# Patient Record
Sex: Male | Born: 1950
Health system: Southern US, Community
[De-identification: ages and names within clinical notes are randomized; demographics above are authoritative.]

## PROBLEM LIST (undated history)

## (undated) DIAGNOSIS — I10 Essential (primary) hypertension: Secondary | ICD-10-CM

## (undated) DIAGNOSIS — E785 Hyperlipidemia, unspecified: Secondary | ICD-10-CM

## (undated) DIAGNOSIS — E119 Type 2 diabetes mellitus without complications: Secondary | ICD-10-CM

## (undated) DIAGNOSIS — T7840XA Allergy, unspecified, initial encounter: Secondary | ICD-10-CM

## (undated) HISTORY — PX: TONSILLECTOMY: SUR1361

## (undated) HISTORY — DX: Hyperlipidemia, unspecified: E78.5

## (undated) HISTORY — DX: Essential (primary) hypertension: I10

## (undated) HISTORY — DX: Allergy, unspecified, initial encounter: T78.40XA

## (undated) HISTORY — DX: Type 2 diabetes mellitus without complications: E11.9

---

## 1999-06-30 ENCOUNTER — Encounter: Admission: RE | Admit: 1999-06-30 | Discharge: 1999-09-28 | Payer: Self-pay | Admitting: Internal Medicine

## 2010-02-11 ENCOUNTER — Emergency Department (HOSPITAL_COMMUNITY): Admission: EM | Admit: 2010-02-11 | Discharge: 2010-02-11 | Payer: Self-pay | Admitting: Emergency Medicine

## 2010-09-29 ENCOUNTER — Emergency Department (HOSPITAL_COMMUNITY)
Admission: EM | Admit: 2010-09-29 | Discharge: 2010-09-29 | Payer: Self-pay | Source: Home / Self Care | Admitting: Emergency Medicine

## 2010-09-30 LAB — CBC
HCT: 36.9 % — ABNORMAL LOW (ref 39.0–52.0)
Hemoglobin: 13 g/dL (ref 13.0–17.0)
MCH: 30 pg (ref 26.0–34.0)
MCHC: 35.2 g/dL (ref 30.0–36.0)
MCV: 85.2 fL (ref 78.0–100.0)
Platelets: 218 10*3/uL (ref 150–400)
RBC: 4.33 MIL/uL (ref 4.22–5.81)
RDW: 12.2 % (ref 11.5–15.5)
WBC: 8.6 10*3/uL (ref 4.0–10.5)

## 2010-09-30 LAB — BASIC METABOLIC PANEL
BUN: 17 mg/dL (ref 6–23)
CO2: 25 mEq/L (ref 19–32)
Calcium: 9.2 mg/dL (ref 8.4–10.5)
Chloride: 102 mEq/L (ref 96–112)
Creatinine, Ser: 1.32 mg/dL (ref 0.4–1.5)
GFR calc Af Amer: >60 mL/min (ref >60)
GFR calc non Af Amer: 56 mL/min — ABNORMAL LOW (ref >60)
Glucose, Bld: 229 mg/dL — ABNORMAL HIGH (ref 70–99)
Potassium: 4.5 mEq/L (ref 3.5–5.1)
Sodium: 137 mEq/L (ref 135–145)

## 2010-09-30 LAB — GLUCOSE, CAPILLARY: Glucose-Capillary: 237 mg/dL — ABNORMAL HIGH (ref 70–99)

## 2012-09-13 DIAGNOSIS — Z9889 Other specified postprocedural states: Secondary | ICD-10-CM | POA: Insufficient documentation

## 2016-02-13 DIAGNOSIS — H35033 Hypertensive retinopathy, bilateral: Secondary | ICD-10-CM | POA: Insufficient documentation

## 2016-02-13 DIAGNOSIS — H2513 Age-related nuclear cataract, bilateral: Secondary | ICD-10-CM | POA: Insufficient documentation

## 2016-02-13 DIAGNOSIS — H35371 Puckering of macula, right eye: Secondary | ICD-10-CM | POA: Insufficient documentation

## 2017-09-13 HISTORY — PX: CATARACT EXTRACTION: SUR2

## 2018-01-03 DIAGNOSIS — J309 Allergic rhinitis, unspecified: Secondary | ICD-10-CM | POA: Diagnosis not present

## 2018-01-03 DIAGNOSIS — Z0000 Encounter for general adult medical examination without abnormal findings: Secondary | ICD-10-CM | POA: Diagnosis not present

## 2018-01-03 DIAGNOSIS — I10 Essential (primary) hypertension: Secondary | ICD-10-CM | POA: Diagnosis not present

## 2018-01-03 DIAGNOSIS — E559 Vitamin D deficiency, unspecified: Secondary | ICD-10-CM | POA: Diagnosis not present

## 2018-01-03 DIAGNOSIS — R351 Nocturia: Secondary | ICD-10-CM | POA: Diagnosis not present

## 2018-01-03 DIAGNOSIS — E1165 Type 2 diabetes mellitus with hyperglycemia: Secondary | ICD-10-CM | POA: Diagnosis not present

## 2018-01-10 DIAGNOSIS — E1165 Type 2 diabetes mellitus with hyperglycemia: Secondary | ICD-10-CM | POA: Diagnosis not present

## 2018-05-10 DIAGNOSIS — Z6831 Body mass index (BMI) 31.0-31.9, adult: Secondary | ICD-10-CM | POA: Diagnosis not present

## 2018-05-10 DIAGNOSIS — I10 Essential (primary) hypertension: Secondary | ICD-10-CM | POA: Diagnosis not present

## 2018-05-10 DIAGNOSIS — E1165 Type 2 diabetes mellitus with hyperglycemia: Secondary | ICD-10-CM | POA: Diagnosis not present

## 2018-05-10 DIAGNOSIS — E669 Obesity, unspecified: Secondary | ICD-10-CM | POA: Diagnosis not present

## 2018-08-28 ENCOUNTER — Encounter: Payer: Self-pay | Admitting: Internal Medicine

## 2018-08-28 ENCOUNTER — Ambulatory Visit (INDEPENDENT_AMBULATORY_CARE_PROVIDER_SITE_OTHER): Payer: Medicare Other | Admitting: Internal Medicine

## 2018-08-28 VITALS — BP 116/70 | HR 90 | Temp 98.2°F | Ht 68.0 in | Wt 202.8 lb

## 2018-08-28 DIAGNOSIS — E6609 Other obesity due to excess calories: Secondary | ICD-10-CM | POA: Insufficient documentation

## 2018-08-28 DIAGNOSIS — Z6830 Body mass index (BMI) 30.0-30.9, adult: Secondary | ICD-10-CM | POA: Diagnosis not present

## 2018-08-28 DIAGNOSIS — E66811 Obesity, class 1: Secondary | ICD-10-CM | POA: Insufficient documentation

## 2018-08-28 DIAGNOSIS — I10 Essential (primary) hypertension: Secondary | ICD-10-CM | POA: Diagnosis not present

## 2018-08-28 DIAGNOSIS — Z6831 Body mass index (BMI) 31.0-31.9, adult: Secondary | ICD-10-CM | POA: Insufficient documentation

## 2018-08-28 DIAGNOSIS — E1165 Type 2 diabetes mellitus with hyperglycemia: Secondary | ICD-10-CM

## 2018-08-28 DIAGNOSIS — Z712 Person consulting for explanation of examination or test findings: Secondary | ICD-10-CM

## 2018-08-28 DIAGNOSIS — Z7982 Long term (current) use of aspirin: Secondary | ICD-10-CM

## 2018-08-28 NOTE — Patient Instructions (Signed)

## 2018-08-28 NOTE — Progress Notes (Signed)
Subjective:     Patient ID: Marcus Davis , male    DOB: May 04, 1951 , 67 y.o.   MRN: [id number]   Chief Complaint  Patient presents with  . Diabetes  . Hypertension    HPI  Diabetes  He presents for his follow-up diabetic visit. He has type 2 diabetes mellitus. His disease course has been stable. There are no hypoglycemic associated symptoms. Pertinent negatives for hypoglycemia include no headaches. Pertinent negatives for diabetes include no blurred vision and no chest pain. There are no hypoglycemic complications. Risk factors for coronary artery disease include diabetes mellitus, hypertension and male sex. He is following a diabetic diet. He participates in exercise three times a week.  Hypertension  This is a chronic problem. The current episode started more than 1 year ago. The problem has been gradually improving since onset. The problem is controlled. Pertinent negatives include no blurred vision, chest pain, headaches or shortness of breath.   He reports compliance with meds.   Past Medical History:  Diagnosis Date  . DM (diabetes mellitus) ([place])   . HTN (hypertension)      Family History  Problem Relation Age of Onset  . Diabetes Mother   . Hypertension Mother   . Stroke Mother   . Hypertension Father   . Diabetes Father   . Stroke Paternal Grandfather      Current Outpatient Medications:  .  amLODipine (NORVASC) 10 MG tablet, Take by mouth., Disp: , Rfl:  .  aspirin EC 81 MG tablet, Take 81 mg by mouth daily., Disp: , Rfl:  .  hydrochlorothiazide (HYDRODIURIL) 25 MG tablet, Take by mouth., Disp: , Rfl:  .  lisinopril (PRINIVIL,ZESTRIL) 40 MG tablet, Take by mouth., Disp: , Rfl:  .  sitaGLIPtin-metformin (JANUMET) 50-1000 MG tablet, Take by mouth., Disp: , Rfl:  .  Vitamin D, Cholecalciferol, 25 MCG (1000 UT) CAPS, Take by mouth., Disp: , Rfl:    No Known Allergies   Review of Systems  Constitutional: Negative.   Eyes: Negative for blurred vision.   Respiratory: Negative.  Negative for shortness of breath.   Cardiovascular: Negative.  Negative for chest pain.  Gastrointestinal: Negative.   Neurological: Negative.  Negative for headaches.  Psychiatric/Behavioral: Negative.      Today's Vitals   08/28/18 1157  BP: 116/70  Pulse: 90  Temp: 98.2 F (36.8 C)  TempSrc: Oral  Weight: 202 lb 12.8 oz (92 kg)  Height: _0  (1.727 m)   Body mass index is 30.84 kg/m.   Objective:  Physical Exam Vitals signs and nursing note reviewed.  Constitutional:      Appearance: Normal appearance.  HENT:     Head: Normocephalic and atraumatic.  Neck:     Musculoskeletal: Normal range of motion.  Cardiovascular:     Rate and Rhythm: Normal rate and regular rhythm.     Heart sounds: Normal heart sounds.  Pulmonary:     Effort: Pulmonary effort is normal.     Breath sounds: Normal breath sounds.  Skin:    General: Skin is warm and dry.  Neurological:     General: No focal deficit present.     Mental Status: He is alert.  Psychiatric:        Mood and Affect: Mood normal.         Assessment And Plan:     1. Uncontrolled type 2 diabetes mellitus with hyperglycemia (HCC)  Medications reviewed. I see he is not on a statin. I will defer  checking lipid panel until his next visit in 2020. He will have Medicare at that time. He thinks he has been one in the past. Importance of regular exercise was discussed with the patient.  Previous lab results in [place] were reviewed in full detail. He will rto in 3 months for re-evaluation.   - Hemoglobin A1c - BMP8+EGFR  2. Essential hypertension, benign  Well controlled.  He will continue with current meds. He is encouraged to avoid adding salt to his foods.   3. Class 1 obesity due to excess calories with serious comorbidity and body mass index (BMI) of 30.0 to 30.9 in adult  He is encouraged to strive for BMI less than 27 to decrease cardiac risk.   [name], MD

## 2018-08-29 LAB — BMP8+EGFR
BUN/Creatinine Ratio: 9 — ABNORMAL LOW (ref 10–24)
BUN: 10 mg/dL (ref 8–27)
CO2: 22 mmol/L (ref 20–29)
Calcium: 9.4 mg/dL (ref 8.6–10.2)
Chloride: 102 mmol/L (ref 96–106)
Creatinine, Ser: 1.14 mg/dL (ref 0.76–1.27)
GFR calc Af Amer: 78 mL/min/{1.73_m2} (ref >59)
GFR calc non Af Amer: 67 mL/min/{1.73_m2} (ref >59)
Glucose: 81 mg/dL (ref 65–99)
Potassium: 4.4 mmol/L (ref 3.5–5.2)
Sodium: 140 mmol/L (ref 134–144)

## 2018-08-29 LAB — HEMOGLOBIN A1C
Est. average glucose Bld gHb Est-mCnc: 134 mg/dL
Hgb A1c MFr Bld: 6.3 % — ABNORMAL HIGH (ref 4.8–5.6)

## 2018-08-29 NOTE — Progress Notes (Signed)
Your hba1c is 6.3, your kidney fxn is stable. Be sure to stay well hydrated.

## 2018-11-29 ENCOUNTER — Ambulatory Visit: Payer: Medicare Other | Admitting: Internal Medicine

## 2018-11-30 ENCOUNTER — Ambulatory Visit (INDEPENDENT_AMBULATORY_CARE_PROVIDER_SITE_OTHER): Payer: Medicare HMO | Admitting: Internal Medicine

## 2018-11-30 ENCOUNTER — Encounter: Payer: Self-pay | Admitting: Internal Medicine

## 2018-11-30 ENCOUNTER — Other Ambulatory Visit: Payer: Self-pay

## 2018-11-30 VITALS — BP 144/82 | HR 97 | Temp 98.7°F | Ht 68.5 in | Wt 209.2 lb

## 2018-11-30 DIAGNOSIS — I10 Essential (primary) hypertension: Secondary | ICD-10-CM

## 2018-11-30 DIAGNOSIS — E1165 Type 2 diabetes mellitus with hyperglycemia: Secondary | ICD-10-CM | POA: Diagnosis not present

## 2018-11-30 DIAGNOSIS — Z6831 Body mass index (BMI) 31.0-31.9, adult: Secondary | ICD-10-CM | POA: Diagnosis not present

## 2018-11-30 DIAGNOSIS — E7800 Pure hypercholesterolemia, unspecified: Secondary | ICD-10-CM

## 2018-11-30 DIAGNOSIS — E6609 Other obesity due to excess calories: Secondary | ICD-10-CM

## 2018-11-30 DIAGNOSIS — Z1211 Encounter for screening for malignant neoplasm of colon: Secondary | ICD-10-CM | POA: Diagnosis not present

## 2018-11-30 NOTE — Patient Instructions (Signed)
Preventing Unhealthy Weight Gain, Adult  Staying at a healthy weight is important to your overall health. When fat builds up in your body, you may become overweight or obese. Being overweight or obese increases your risk of developing certain health problems, such as heart disease, diabetes, sleeping problems, joint problems, and some types of cancer.  Unhealthy weight gain is often the result of making unhealthy food choices or not getting enough exercise. You can make changes to your lifestyle to prevent obesity and stay as healthy as possible.  What nutrition changes can be made?    Eat only as much as your body needs. To do this:  Pay attention to signs that you are hungry or full. Stop eating as soon as you feel full.  If you feel hungry, try drinking water first before eating. Drink enough water so your urine is clear or pale yellow.  Eat smaller portions. Pay attention to portion sizes when eating out.  Look at serving sizes on food labels. Most foods contain more than one serving per container.  Eat the recommended number of calories for your gender and activity level. For most active people, a daily total of 2,000 calories is appropriate. If you are trying to lose weight or are not very active, you may need to eat fewer calories. Talk with your health care provider or a diet and nutrition specialist (dietitian) about how many calories you need each day.  Choose healthy foods, such as:  Fruits and vegetables. At each meal, try to fill at least half of your plate with fruits and vegetables.  Whole grains, such as whole-wheat bread, brown rice, and quinoa.  Lean meats, such as chicken or fish.  Other healthy proteins, such as beans, eggs, or tofu.  Healthy fats, such as nuts, seeds, fatty fish, and olive oil.  Low-fat or fat-free dairy products.  Check food labels, and avoid food and drinks that:  Are high in calories.  Have added sugar.  Are high in sodium.  Have saturated fats or trans fats.  Cook foods  in healthier ways, such as by baking, broiling, or grilling.  Make a meal plan for the week, and shop with a grocery list to help you stay on track with your purchases. Try to avoid going to the grocery store when you are hungry.  When grocery shopping, try to shop around the outside of the store first, where the fresh foods are. Doing this helps you to avoid prepackaged foods, which can be high in sugar, salt (sodium), and fat.  What lifestyle changes can be made?    Exercise for 30 or more minutes on 5 or more days each week. Exercising may include brisk walking, yard work, biking, running, swimming, and team sports like basketball and soccer. Ask your health care provider which exercises are safe for you.  Do muscle-strengthening activities, such as lifting weights or using resistance bands, on 2 or more days a week.  Do not use any products that contain nicotine or tobacco, such as cigarettes and e-cigarettes. If you need help quitting, ask your health care provider.  Limit alcohol intake to no more than 1 drink a day for nonpregnant women and 2 drinks a day for men. One drink equals 12 oz of beer, 5 oz of wine, or 1 oz of hard liquor.  Try to get 7-9 hours of sleep each night.  What other changes can be made?  Keep a food and activity journal to keep track of:    What you ate and how many calories you had. Remember to count the calories in sauces, dressings, and side dishes.  Whether you were active, and what exercises you did.  Your calorie, weight, and activity goals.  Check your weight regularly. Track any changes. If you notice you have gained weight, make changes to your diet or activity routine.  Avoid taking weight-loss medicines or supplements. Talk to your health care provider before starting any new medicine or supplement.  Talk to your health care provider before trying any new diet or exercise plan.  Why are these changes important?  Eating healthy, staying active, and having healthy habits can help  you to prevent obesity. Those changes also:  Help you manage stress and emotions.  Help you connect with friends and family.  Improve your self-esteem.  Improve your sleep.  Prevent long-term health problems.  What can happen if changes are not made?  Being obese or overweight can cause you to develop joint or bone problems, which can make it hard for you to stay active or do activities you enjoy. Being obese or overweight also puts stress on your heart and lungs and can lead to health problems like diabetes, heart disease, and some cancers.  Where to find more information  Talk with your health care provider or a dietitian about healthy eating and healthy lifestyle choices. You may also find information from:  U.S. Department of Agriculture, MyPlate: www.choosemyplate.gov  American Heart Association: www.heart.org  Centers for Disease Control and Prevention: www.cdc.gov  Summary  Staying at a healthy weight is important to your overall health. It helps you to prevent certain diseases and health problems, such as heart disease, diabetes, joint problems, sleep disorders, and some types of cancer.  Being obese or overweight can cause you to develop joint or bone problems, which can make it hard for you to stay active or do activities you enjoy.  You can prevent unhealthy weight gain by eating a healthy diet, exercising regularly, not smoking, limiting alcohol, and getting enough sleep.  Talk with your health care provider or a dietitian for guidance about healthy eating and healthy lifestyle choices.  This information is not intended to replace advice given to you by your health care provider. Make sure you discuss any questions you have with your health care provider.  Document Released: 08/31/2016 Document Revised: 06/10/2017 Document Reviewed: 10/06/2016  Elsevier Interactive Patient Education  2019 Elsevier Inc.

## 2018-12-01 LAB — CMP14+EGFR
ALT: 25 IU/L (ref 0–44)
AST: 25 IU/L (ref 0–40)
Albumin/Globulin Ratio: 1.9 (ref 1.2–2.2)
Albumin: 4.1 g/dL (ref 3.8–4.8)
Alkaline Phosphatase: 55 IU/L (ref 39–117)
BUN/Creatinine Ratio: 7 — ABNORMAL LOW (ref 10–24)
BUN: 7 mg/dL — ABNORMAL LOW (ref 8–27)
Bilirubin Total: 0.4 mg/dL (ref 0.0–1.2)
CO2: 21 mmol/L (ref 20–29)
Calcium: 9.2 mg/dL (ref 8.6–10.2)
Chloride: 101 mmol/L (ref 96–106)
Creatinine, Ser: 1.03 mg/dL (ref 0.76–1.27)
GFR calc Af Amer: 88 mL/min/{1.73_m2} (ref >59)
GFR calc non Af Amer: 76 mL/min/{1.73_m2} (ref >59)
Globulin, Total: 2.2 g/dL (ref 1.5–4.5)
Glucose: 184 mg/dL — ABNORMAL HIGH (ref 65–99)
Potassium: 4.1 mmol/L (ref 3.5–5.2)
Sodium: 138 mmol/L (ref 134–144)
Total Protein: 6.3 g/dL (ref 6.0–8.5)

## 2018-12-01 LAB — HEMOGLOBIN A1C
Est. average glucose Bld gHb Est-mCnc: 148 mg/dL
Hgb A1c MFr Bld: 6.8 % — ABNORMAL HIGH (ref 4.8–5.6)

## 2018-12-01 LAB — LIPID PANEL
Chol/HDL Ratio: 2.9 ratio (ref 0.0–5.0)
Cholesterol, Total: 102 mg/dL (ref 100–199)
HDL: 35 mg/dL — ABNORMAL LOW (ref >39)
LDL Calculated: 23 mg/dL (ref 0–99)
Triglycerides: 221 mg/dL — ABNORMAL HIGH (ref 0–149)
VLDL Cholesterol Cal: 44 mg/dL — ABNORMAL HIGH (ref 5–40)

## 2018-12-07 ENCOUNTER — Other Ambulatory Visit: Payer: Self-pay

## 2018-12-08 NOTE — Progress Notes (Signed)
Subjective:     Patient ID: Marcus Davis , male    DOB: 09-23-1950 , 68 y.o.   MRN: [id number]   Chief Complaint  Patient presents with  . Hyperlipidemia  . Diabetes  . Hypertension    HPI  He is currently taking atorvastatin without any issues. He reports daily use. He has not experienced any side effects.   Diabetes  He presents for his follow-up diabetic visit. He has type 2 diabetes mellitus. His disease course has been stable. There are no hypoglycemic associated symptoms. Pertinent negatives for diabetes include no blurred vision and no chest pain. There are no hypoglycemic complications.  Hypertension  This is a chronic problem. The current episode started more than 1 year ago. The problem has been gradually improving since onset. The problem is controlled. Pertinent negatives include no blurred vision, chest pain, palpitations or shortness of breath. Risk factors for coronary artery disease include diabetes mellitus, dyslipidemia, male gender and stress.   He reports compliance with meds.   Past Medical History:  Diagnosis Date  . DM (diabetes mellitus) ([place])   . HTN (hypertension)      Family History  Problem Relation Age of Onset  . Diabetes Mother   . Hypertension Mother   . Stroke Mother   . Hypertension Father   . Diabetes Father   . Stroke Paternal Grandfather      Current Outpatient Medications:  .  amLODipine (NORVASC) 10 MG tablet, Take by mouth., Disp: , Rfl:  .  aspirin EC 81 MG tablet, Take 81 mg by mouth daily., Disp: , Rfl:  .  hydrochlorothiazide (HYDRODIURIL) 25 MG tablet, Take by mouth., Disp: , Rfl:  .  lisinopril (PRINIVIL,ZESTRIL) 40 MG tablet, Take by mouth., Disp: , Rfl:  .  sitaGLIPtin-metformin (JANUMET) 50-1000 MG tablet, Take by mouth., Disp: , Rfl:  .  Vitamin D, Cholecalciferol, 25 MCG (1000 UT) CAPS, Take by mouth., Disp: , Rfl:  .  atorvastatin (LIPITOR) 40 MG tablet, Take 40 mg by mouth daily. 1/2 tablet daily, Disp: , Rfl:    No  Known Allergies   Review of Systems  Constitutional: Negative.   Eyes: Negative for blurred vision.  Respiratory: Negative.  Negative for shortness of breath.   Cardiovascular: Negative.  Negative for chest pain and palpitations.  Gastrointestinal: Negative.   Neurological: Negative.   Psychiatric/Behavioral: Negative.      Today's Vitals   11/30/18 1426  BP: (!) 144/82  Pulse: 97  Temp: 98.7 F (37.1 C)  TempSrc: Oral  Weight: 209 lb 3.2 oz (94.9 kg)  Height: 5' 8.5" (1.74 m)  PainSc: 0-No pain   Body mass index is 31.35 kg/m.   Objective:  Physical Exam Vitals signs and nursing note reviewed.  Constitutional:      Appearance: Normal appearance.  Cardiovascular:     Rate and Rhythm: Normal rate and regular rhythm.     Heart sounds: Normal heart sounds.  Pulmonary:     Effort: Pulmonary effort is normal.     Breath sounds: Normal breath sounds.  Skin:    General: Skin is warm.  Neurological:     General: No focal deficit present.     Mental Status: He is alert.  Psychiatric:        Mood and Affect: Mood normal.         Assessment And Plan:     1. Pure hypercholesterolemia  I will check non-fasting lipid panel. He is encouraged to avoid fried foods and  to exercise no less than five days weekly.   - Lipid panel  2. Uncontrolled type 2 diabetes mellitus with hyperglycemia ([place])  I will check labs as listed below. Importance of dietary, medication and exercise compliance was discussed with the patient.   - CMP14+EGFR - Hemoglobin A1c  3. Essential hypertension, benign  Fair control. He will continue with current meds for now. He is encouraged to avoid adding salt to his foods.   4. Class 1 obesity due to excess calories with serious comorbidity and body mass index (BMI) of 31.0 to 31.9 in adult  He is encouraged to initially strive for BMI less than 30 to decrease cardiac risk. He is advised to exercise no less than 150 minutes per week.    5.  Special screening for malignant neoplasms, colon  He agrees to proceed with Cologuard testing. He is aware that he will need to proceed with colonoscopy if his test is abnormal. He verbalizes understanding of this.    [name], MD

## 2019-01-25 ENCOUNTER — Encounter: Payer: Medicare HMO | Admitting: Internal Medicine

## 2019-01-25 ENCOUNTER — Ambulatory Visit: Payer: Medicare HMO | Admitting: Internal Medicine

## 2019-01-25 ENCOUNTER — Ambulatory Visit: Payer: Medicare HMO

## 2019-01-30 ENCOUNTER — Other Ambulatory Visit: Payer: Self-pay

## 2019-01-30 ENCOUNTER — Ambulatory Visit (INDEPENDENT_AMBULATORY_CARE_PROVIDER_SITE_OTHER): Payer: Medicare HMO | Admitting: Nurse Practitioner

## 2019-01-30 ENCOUNTER — Ambulatory Visit: Payer: Medicare HMO

## 2019-01-30 ENCOUNTER — Encounter: Payer: Medicare HMO | Admitting: Internal Medicine

## 2019-01-30 ENCOUNTER — Encounter: Payer: Self-pay | Admitting: Nurse Practitioner

## 2019-01-30 ENCOUNTER — Ambulatory Visit: Payer: Medicare HMO | Admitting: Internal Medicine

## 2019-01-30 ENCOUNTER — Ambulatory Visit (INDEPENDENT_AMBULATORY_CARE_PROVIDER_SITE_OTHER): Payer: Medicare HMO

## 2019-01-30 VITALS — BP 128/82 | HR 88 | Temp 98.5°F | Ht 69.0 in | Wt 203.0 lb

## 2019-01-30 VITALS — BP 128/82 | HR 88 | Temp 98.5°F | Ht 69.0 in | Wt 203.2 lb

## 2019-01-30 DIAGNOSIS — Z6831 Body mass index (BMI) 31.0-31.9, adult: Secondary | ICD-10-CM | POA: Diagnosis not present

## 2019-01-30 DIAGNOSIS — E119 Type 2 diabetes mellitus without complications: Secondary | ICD-10-CM

## 2019-01-30 DIAGNOSIS — E6609 Other obesity due to excess calories: Secondary | ICD-10-CM | POA: Diagnosis not present

## 2019-01-30 DIAGNOSIS — Z0000 Encounter for general adult medical examination without abnormal findings: Secondary | ICD-10-CM

## 2019-01-30 DIAGNOSIS — I10 Essential (primary) hypertension: Secondary | ICD-10-CM | POA: Diagnosis not present

## 2019-01-30 DIAGNOSIS — E1165 Type 2 diabetes mellitus with hyperglycemia: Secondary | ICD-10-CM | POA: Diagnosis not present

## 2019-01-30 DIAGNOSIS — R351 Nocturia: Secondary | ICD-10-CM

## 2019-01-30 LAB — POCT URINALYSIS DIPSTICK
Bilirubin, UA: NEGATIVE
Blood, UA: NEGATIVE
Glucose, UA: NEGATIVE
Ketones, UA: NEGATIVE
Leukocytes, UA: NEGATIVE
Nitrite, UA: NEGATIVE
Protein, UA: POSITIVE — AB
Spec Grav, UA: 1.02 (ref 1.010–1.025)
Urobilinogen, UA: 0.2 E.U./dL
pH, UA: 6 (ref 5.0–8.0)

## 2019-01-30 LAB — POCT UA - MICROALBUMIN
Albumin/Creatinine Ratio, Urine, POC: 300
Creatinine, POC: 300 mg/dL
Microalbumin Ur, POC: 80 mg/L

## 2019-01-30 NOTE — Progress Notes (Signed)
Subjective:   Marcus Davis is a 68 y.o. male who presents for Medicare Annual/Subsequent preventive examination.  Review of Systems:  n/a Cardiac Risk Factors include: advanced age (>4655men, 101>65 women);male gender;hypertension;diabetes mellitus     Objective:    Vitals: BP 128/82 (BP Location: Left Arm, Patient Position: Sitting, Cuff Size: Large)   Pulse 88   Temp 98.5 F (36.9 C) (Oral)   Ht 5\' 9"  (1.753 m)   Wt 203 lb (92.1 kg)   BMI 29.98 kg/m   Body mass index is 29.98 kg/m.  Advanced Directives 01/30/2019  Does Patient Have a Medical Advance Directive? No  Would patient like information on creating a medical advance directive? No - Patient declined    Tobacco Social History   Tobacco Use  Smoking Status Never Smoker  Smokeless Tobacco Never Used     Counseling given: Not Answered   Clinical Intake:  Pre-visit preparation completed: Yes  Pain : No/denies pain Pain Score: 0-No pain     Nutritional Status: BMI 25 -29 Overweight Nutritional Risks: None Diabetes: Yes CBG done?: No Did pt. bring in CBG monitor from home?: No  How often do you need to have someone help you when you read instructions, pamphlets, or other written materials from your doctor or pharmacy?: 1 - Never What is the last grade level you completed in school?: some college  Interpreter Needed?: No  Information entered by :: NAllen LPN  Past Medical History:  Diagnosis Date  . DM (diabetes mellitus) (HCC)   . HTN (hypertension)    Past Surgical History:  Procedure Laterality Date  . CATARACT EXTRACTION Right 2019  . TONSILLECTOMY     age 89   Family History  Problem Relation Age of Onset  . Diabetes Mother   . Hypertension Mother   . Stroke Mother   . Hypertension Father   . Diabetes Father   . Stroke Paternal Grandfather    Social History   Socioeconomic History  . Marital status: Married    Spouse name: Not on file  . Number of children: Not on file  . Years of  education: Not on file  . Highest education level: Not on file  Occupational History  . Occupation: retired  Engineer, productionocial Needs  . Financial resource strain: Not hard at all  . Food insecurity:    Worry: Never true    Inability: Never true  . Transportation needs:    Medical: No    Non-medical: No  Tobacco Use  . Smoking status: Never Smoker  . Smokeless tobacco: Never Used  Substance and Sexual Activity  . Alcohol use: Not Currently    Comment: Holidays  . Drug use: Never  . Sexual activity: Yes  Lifestyle  . Physical activity:    Days per week: 3 days    Minutes per session: 150+ min  . Stress: Not at all  Relationships  . Social connections:    Talks on phone: Not on file    Gets together: Not on file    Attends religious service: Not on file    Active member of club or organization: Not on file    Attends meetings of clubs or organizations: Not on file    Relationship status: Not on file  Other Topics Concern  . Not on file  Social History Narrative  . Not on file    Outpatient Encounter Medications as of 01/30/2019  Medication Sig  . amLODipine (NORVASC) 10 MG tablet Take by mouth.  .Marland Kitchen  aspirin EC 81 MG tablet Take 81 mg by mouth daily.  Marland Kitchen atorvastatin (LIPITOR) 40 MG tablet Take 40 mg by mouth daily. 1/2 tablet daily  . hydrochlorothiazide (HYDRODIURIL) 25 MG tablet Take by mouth.  Marland Kitchen lisinopril (PRINIVIL,ZESTRIL) 40 MG tablet Take by mouth.  . sitaGLIPtin-metformin (JANUMET) 50-1000 MG tablet Take by mouth.  . Vitamin D, Cholecalciferol, 25 MCG (1000 UT) CAPS Take by mouth.   No facility-administered encounter medications on file as of 01/30/2019.     Activities of Daily Living In your present state of health, do you have any difficulty performing the following activities: 01/30/2019  Hearing? N  Vision? N  Difficulty concentrating or making decisions? N  Walking or climbing stairs? N  Dressing or bathing? N  Doing errands, shopping? N  Preparing Food and eating  ? N  Using the Toilet? N  In the past six months, have you accidently leaked urine? N  Do you have problems with loss of bowel control? N  Managing your Medications? N  Managing your Finances? N  Housekeeping or managing your Housekeeping? N  Some recent data might be hidden    Patient Care Team: [name], MD as PCP - General (Internal Medicine)   Assessment:   This is a routine wellness examination for Marcus Davis.  Exercise Activities and Dietary recommendations Current Exercise Habits: Home exercise routine, Type of exercise: walking, Time (Minutes): > 60, Frequency (Times/Week): 3, Weekly Exercise (Minutes/Week): 0, Intensity: Moderate  Goals    . Patient Stated     To stay healthy        Fall Risk Fall Risk  01/30/2019 01/30/2019 11/30/2018  Falls in the past year? 0 0 0  Risk for fall due to : Medication side effect - -  Follow up Education provided;Falls prevention discussed - -   Is the patient's home free of loose throw rugs in walkways, pet beds, electrical cords, etc?   yes      Grab bars in the bathroom? yes      Handrails on the stairs?  yes      Adequate lighting?   yes  Timed Get Up and Go Performed: n/a  Depression Screen PHQ 2/9 Scores 01/30/2019 01/30/2019 11/30/2018  PHQ - 2 Score 0 0 0  PHQ- 9 Score 0 - -    Cognitive Function     6CIT Screen 01/30/2019  What Year? 0 points  What month? 0 points  What time? 0 points  Count back from 20 0 points  Months in reverse 0 points  Repeat phrase 0 points  Total Score 0     There is no immunization history on file for this patient.  Qualifies for Shingles Vaccine? yes  Screening Tests Health Maintenance  Topic Date Due  . OPHTHALMOLOGY EXAM  12/13/1962  . COLONOSCOPY  12/13/2002  . PNA vac Low Risk Adult (1 of 2 - PCV13) 12/12/2017  . FOOT EXAM  01/04/2019  . INFLUENZA VACCINE  04/14/2019  . HEMOGLOBIN A1C  06/02/2019  . TETANUS/TDAP  02/18/2026  . Hepatitis C Screening  Completed    Cancer Screenings: Lung: Low Dose CT Chest recommended if Age 21-80 years, 30 pack-year currently smoking OR have quit w/in 15years. Patient does not qualify. Colorectal: up to date per patient   Additional Screenings:  Hepatitis C Screening:11/17/2015      Plan:   Wants to stay healthy  I have personally reviewed and noted the following in the patient's chart:   . Medical and  social history . Use of alcohol, tobacco or illicit drugs  . Current medications and supplements . Functional ability and status . Nutritional status . Physical activity . Advanced directives . List of other physicians . Hospitalizations, surgeries, and ER visits in previous 12 months . Vitals . Screenings to include cognitive, depression, and falls . Referrals and appointments  In addition, I have reviewed and discussed with patient certain preventive protocols, quality metrics, and best practice recommendations. A written personalized care plan for preventive services as well as general preventive health recommendations were provided to patient.     Barb Merino, LPN  [id number]

## 2019-01-30 NOTE — Progress Notes (Signed)
Subjective:     Patient ID: Marcus Davis , male    DOB: [birth year] , 68 y.o.   MRN: [id number]   Chief Complaint  Patient presents with  . Annual Exam   Men's preventive visit. Patient Health Questionnaire (PHQ-2) is    Office Visit from 01/30/2019 in [facility]  PHQ-2 Total Score  0     Patient is on a regular diet, limited bread intake. Marital status: Married. Relevant history for alcohol use is:  Social History   Substance and Sexual Activity  Alcohol Use Not Currently   Comment: Holidays   Relevant history for tobacco use is:  Social History   Tobacco Use  Smoking Status Never Smoker  Smokeless Tobacco Never Used    Exercise walking 3 days per week about 2 hours   HPI  Here for HM  Hypertension  This is a chronic problem. The current episode started more than 1 year ago. The problem is controlled. Pertinent negatives include no anxiety, chest pain or palpitations. There are no associated agents to hypertension. There are no known risk factors for coronary artery disease. Past treatments include angiotensin blockers and diuretics. There are no compliance problems.  There is no history of angina or kidney disease. There is no history of chronic renal disease.  Diabetes  He presents for his follow-up diabetic visit. He has type 2 diabetes mellitus. There are no hypoglycemic associated symptoms. There are no diabetic associated symptoms. Pertinent negatives for diabetes include no chest pain. There are no hypoglycemic complications. There are no known risk factors for coronary artery disease. Current diabetic treatment includes oral agent (monotherapy). He is compliant with treatment all of the time. When asked about meal planning, he reported none. He has not had a previous visit with a dietitian. He participates in exercise three times a week. There is no change in his home blood glucose trend. An ACE inhibitor/angiotensin II receptor blocker is being  taken. He does not see a podiatrist.Eye exam is current (went last May).     Past Medical History:  Diagnosis Date  . DM (diabetes mellitus) (HCC)   . HTN (hypertension)      Family History  Problem Relation Age of Onset  . Diabetes Mother   . Hypertension Mother   . Stroke Mother   . Hypertension Father   . Diabetes Father   . Stroke Paternal Grandfather      Current Outpatient Medications:  .  amLODipine (NORVASC) 10 MG tablet, Take by mouth., Disp: , Rfl:  .  aspirin EC 81 MG tablet, Take 81 mg by mouth daily., Disp: , Rfl:  .  atorvastatin (LIPITOR) 40 MG tablet, Take 40 mg by mouth daily. 1/2 tablet daily, Disp: , Rfl:  .  hydrochlorothiazide (HYDRODIURIL) 25 MG tablet, Take by mouth., Disp: , Rfl:  .  lisinopril (PRINIVIL,ZESTRIL) 40 MG tablet, Take by mouth., Disp: , Rfl:  .  sitaGLIPtin-metformin (JANUMET) 50-1000 MG tablet, Take by mouth., Disp: , Rfl:  .  Vitamin D, Cholecalciferol, 25 MCG (1000 UT) CAPS, Take by mouth., Disp: , Rfl:    Allergies  Allergen Reactions  . Pollen Extract Other (See Comments)    Sneezing, runny nose, congestion,      Review of Systems  Constitutional: Negative.   HENT: Negative.   Eyes: Negative.   Respiratory: Negative.   Cardiovascular: Negative.  Negative for chest pain, palpitations and leg swelling.  Gastrointestinal: Negative.   Endocrine: Negative.   Genitourinary: Negative.  Musculoskeletal: Negative.   Skin: Negative.   Neurological: Negative.   Hematological: Negative.   Psychiatric/Behavioral: Negative.      Today's Vitals   01/30/19 1103  BP: 128/82  Pulse: 88  Temp: 98.5 F (36.9 C)  TempSrc: Oral  Weight: 203 lb 3.2 oz (92.2 kg)  Height: 5\' 9"  (1.753 m)  PainSc: 0-No pain   Body mass index is 30.01 kg/m.   Objective:  Physical Exam Vitals signs reviewed.  Constitutional:      Appearance: Normal appearance.  HENT:     Head: Normocephalic and atraumatic.     Right Ear: Tympanic membrane and  external ear normal. There is no impacted cerumen.     Left Ear: Tympanic membrane, ear canal and external ear normal. There is no impacted cerumen.  Eyes:     Extraocular Movements: Extraocular movements intact.     Conjunctiva/sclera: Conjunctivae normal.     Pupils: Pupils are equal, round, and reactive to light.  Neck:     Musculoskeletal: Normal range of motion and neck supple.  Cardiovascular:     Rate and Rhythm: Normal rate and regular rhythm.     Pulses: Normal pulses.     Heart sounds: Normal heart sounds. No murmur.  Chest:     Chest wall: No tenderness.  Abdominal:     General: Abdomen is flat. Bowel sounds are normal. There is no distension.     Comments: rounded  Musculoskeletal: Normal range of motion.  Skin:    General: Skin is warm and dry.     Capillary Refill: Capillary refill takes less than 2 seconds.  Neurological:     General: No focal deficit present.     Mental Status: He is alert and oriented to person, place, and time.  Psychiatric:        Mood and Affect: Mood normal.        Behavior: Behavior normal.        Thought Content: Thought content normal.        Judgment: Judgment normal.         Assessment And Plan:     1. Health maintenance examination . Behavior modifications discussed and diet history reviewed.   . Pt will continue to exercise regularly and modify diet with low GI, plant based foods and decrease intake of processed foods.  . Recommend intake of daily multivitamin, Vitamin D, and calcium.   2. Essential hypertension . B/P is controlled.  . CMP ordered to check renal function.  . The importance of regular exercise and dietary modification was stressed to the patient.  . Stressed importance of losing ten percent of her body weight to help with B/P control.  . The weight loss would help with decreasing cardiac and cancer risk as well.  - EKG 12-Lead - POCT Urinalysis Dipstick (81002) - POCT UA - Microalbumin  3. Nocturia  Will  check PSA - PSA  4. Class 1 obesity due to excess calories with serious comorbidity and body mass index (BMI) of 31.0 to 31.9 in adult Chronic Discussed healthy diet and regular exercise options  Encouraged to exercise at least 150 minutes per week with 2 days of strength training  5. Type 2 diabetes mellitus without complication, without long-term current use of insulin (HCC) Chronic, stable Diabetic foot exam done Continue with current medications Encouraged to limit intake of sugary foods and drinks Encouraged to increase physical activity to 150 minutes per week - Hemoglobin A1c   Arnette FeltsJanece Jiles Goya, FNP  THE PATIENT IS ENCOURAGED TO PRACTICE SOCIAL DISTANCING DUE TO THE COVID-19 PANDEMIC.   

## 2019-01-30 NOTE — Patient Instructions (Addendum)
Marcus Davis , Thank you for taking time to come for your Medicare Wellness Visit. I appreciate your ongoing commitment to your health goals. Please review the following plan we discussed and let me know if I can assist you in the future.   Screening recommendations/referrals: Colonoscopy: 10/2017 per patient Recommended yearly ophthalmology/optometry visit for glaucoma screening and checkup Recommended yearly dental visit for hygiene and checkup  Vaccinations: Influenza vaccine: declined Pneumococcal vaccine: from [place] not sure when Tdap vaccine: 02/2016 Shingles vaccine: discussed    Advanced directives: Advance directive discussed with you today. Even though you declined this today please call our office should you change your mind and we can give you the proper paperwork for you to fill out.   Conditions/risks identified: Overweight  Next appointment: 05/02/2019 at 2:30  Preventive Care 65 Years and Older, Male Preventive care refers to lifestyle choices and visits with your health care provider that can promote health and wellness. What does preventive care include?  A yearly physical exam. This is also called an annual well check.  Dental exams once or twice a year.  Routine eye exams. Ask your health care provider how often you should have your eyes checked.  Personal lifestyle choices, including:  Daily care of your teeth and gums.  Regular physical activity.  Eating a healthy diet.  Avoiding tobacco and drug use.  Limiting alcohol use.  Practicing safe sex.  Taking low doses of aspirin every day.  Taking vitamin and mineral supplements as recommended by your health care provider. What happens during an annual well check? The services and screenings done by your health care provider during your annual well check will depend on your age, overall health, lifestyle risk factors, and family history of disease. Counseling  Your health care provider may ask you questions  about your:  Alcohol use.  Tobacco use.  Drug use.  Emotional well-being.  Home and relationship well-being.  Sexual activity.  Eating habits.  History of falls.  Memory and ability to understand (cognition).  Work and work Astronomer. Screening  You may have the following tests or measurements:  Height, weight, and BMI.  Blood pressure.  Lipid and cholesterol levels. These may be checked every 5 years, or more frequently if you are over 55 years old.  Skin check.  Lung cancer screening. You may have this screening every year starting at age 71 if you have a 30-pack-year history of smoking and currently smoke or have quit within the past 15 years.  Fecal occult blood test (FOBT) of the stool. You may have this test every year starting at age 10.  Flexible sigmoidoscopy or colonoscopy. You may have a sigmoidoscopy every 5 years or a colonoscopy every 10 years starting at age 43.  Prostate cancer screening. Recommendations will vary depending on your family history and other risks.  Hepatitis C blood test.  Hepatitis B blood test.  Sexually transmitted disease (STD) testing.  Diabetes screening. This is done by checking your blood sugar (glucose) after you have not eaten for a while (fasting). You may have this done every 1-3 years.  Abdominal aortic aneurysm (AAA) screening. You may need this if you are a current or former smoker.  Osteoporosis. You may be screened starting at age 6 if you are at high risk. Talk with your health care provider about your test results, treatment options, and if necessary, the need for more tests. Vaccines  Your health care provider may recommend certain vaccines, such as:  Influenza  vaccine. This is recommended every year.  Tetanus, diphtheria, and acellular pertussis (Tdap, Td) vaccine. You may need a Td booster every 10 years.  Zoster vaccine. You may need this after age 94.  Pneumococcal 13-valent conjugate (PCV13)  vaccine. One dose is recommended after age 17.  Pneumococcal polysaccharide (PPSV23) vaccine. One dose is recommended after age 2. Talk to your health care provider about which screenings and vaccines you need and how often you need them. This information is not intended to replace advice given to you by your health care provider. Make sure you discuss any questions you have with your health care provider. Document Released: 09/26/2015 Document Revised: 05/19/2016 Document Reviewed: 07/01/2015 Elsevier Interactive Patient Education  2017 [place] Prevention in the Home Falls can cause injuries. They can happen to people of all ages. There are many things you can do to make your home safe and to help prevent falls. What can I do on the outside of my home?  Regularly fix the edges of walkways and driveways and fix any cracks.  Remove anything that might make you trip as you walk through a door, such as a raised step or threshold.  Trim any bushes or trees on the path to your home.  Use bright outdoor lighting.  Clear any walking paths of anything that might make someone trip, such as rocks or tools.  Regularly check to see if handrails are loose or broken. Make sure that both sides of any steps have handrails.  Any raised decks and porches should have guardrails on the edges.  Have any leaves, snow, or ice cleared regularly.  Use sand or salt on walking paths during winter.  Clean up any spills in your garage right away. This includes oil or grease spills. What can I do in the bathroom?  Use night lights.  Install grab bars by the toilet and in the tub and shower. Do not use towel bars as grab bars.  Use non-skid mats or decals in the tub or shower.  If you need to sit down in the shower, use a plastic, non-slip stool.  Keep the floor dry. Clean up any water that spills on the floor as soon as it happens.  Remove soap buildup in the tub or shower regularly.   Attach bath mats securely with double-sided non-slip rug tape.  Do not have throw rugs and other things on the floor that can make you trip. What can I do in the bedroom?  Use night lights.  Make sure that you have a light by your bed that is easy to reach.  Do not use any sheets or blankets that are too big for your bed. They should not hang down onto the floor.  Have a firm chair that has side arms. You can use this for support while you get dressed.  Do not have throw rugs and other things on the floor that can make you trip. What can I do in the kitchen?  Clean up any spills right away.  Avoid walking on wet floors.  Keep items that you use a lot in easy-to-reach places.  If you need to reach something above you, use a strong step stool that has a grab bar.  Keep electrical cords out of the way.  Do not use floor polish or wax that makes floors slippery. If you must use wax, use non-skid floor wax.  Do not have throw rugs and other things on the floor that can  make you trip. What can I do with my stairs?  Do not leave any items on the stairs.  Make sure that there are handrails on both sides of the stairs and use them. Fix handrails that are broken or loose. Make sure that handrails are as long as the stairways.  Check any carpeting to make sure that it is firmly attached to the stairs. Fix any carpet that is loose or worn.  Avoid having throw rugs at the top or bottom of the stairs. If you do have throw rugs, attach them to the floor with carpet tape.  Make sure that you have a light switch at the top of the stairs and the bottom of the stairs. If you do not have them, ask someone to add them for you. What else can I do to help prevent falls?  Wear shoes that:  Do not have high heels.  Have rubber bottoms.  Are comfortable and fit you well.  Are closed at the toe. Do not wear sandals.  If you use a stepladder:  Make sure that it is fully opened. Do not climb  a closed stepladder.  Make sure that both sides of the stepladder are locked into place.  Ask someone to hold it for you, if possible.  Clearly mark and make sure that you can see:  Any grab bars or handrails.  First and last steps.  Where the edge of each step is.  Use tools that help you move around (mobility aids) if they are needed. These include:  Canes.  Walkers.  Scooters.  Crutches.  Turn on the lights when you go into a dark area. Replace any light bulbs as soon as they burn out.  Set up your furniture so you have a clear path. Avoid moving your furniture around.  If any of your floors are uneven, fix them.  If there are any pets around you, be aware of where they are.  Review your medicines with your doctor. Some medicines can make you feel dizzy. This can increase your chance of falling. Ask your doctor what other things that you can do to help prevent falls. This information is not intended to replace advice given to you by your health care provider. Make sure you discuss any questions you have with your health care provider. Document Released: 06/26/2009 Document Revised: 02/05/2016 Document Reviewed: 10/04/2014 Elsevier Interactive Patient Education  2017 [employer].

## 2019-01-31 LAB — PSA: Prostate Specific Ag, Serum: 0.2 ng/mL (ref 0.0–4.0)

## 2019-01-31 LAB — HEMOGLOBIN A1C
Est. average glucose Bld gHb Est-mCnc: 137 mg/dL
Hgb A1c MFr Bld: 6.4 % — ABNORMAL HIGH (ref 4.8–5.6)

## 2019-02-01 ENCOUNTER — Ambulatory Visit: Payer: Self-pay | Admitting: Internal Medicine

## 2019-02-01 ENCOUNTER — Ambulatory Visit: Payer: Medicare Other

## 2019-02-01 ENCOUNTER — Ambulatory Visit: Payer: Self-pay

## 2019-02-01 ENCOUNTER — Encounter: Payer: Self-pay | Admitting: Internal Medicine

## 2019-02-08 DIAGNOSIS — E1141 Type 2 diabetes mellitus with diabetic mononeuropathy: Secondary | ICD-10-CM | POA: Insufficient documentation

## 2019-02-08 DIAGNOSIS — E1122 Type 2 diabetes mellitus with diabetic chronic kidney disease: Secondary | ICD-10-CM | POA: Insufficient documentation

## 2019-02-08 DIAGNOSIS — Z794 Long term (current) use of insulin: Secondary | ICD-10-CM | POA: Insufficient documentation

## 2019-02-08 DIAGNOSIS — E119 Type 2 diabetes mellitus without complications: Secondary | ICD-10-CM | POA: Insufficient documentation

## 2019-02-08 NOTE — Patient Instructions (Signed)
Health Maintenance  Topic Date Due  . OPHTHALMOLOGY EXAM  12/13/1962  . COLONOSCOPY  12/13/2002  . PNA vac Low Risk Adult (1 of 2 - PCV13) 12/12/2017  . FOOT EXAM  01/04/2019  . INFLUENZA VACCINE  04/14/2019  . HEMOGLOBIN A1C  08/02/2019  . TETANUS/TDAP  02/18/2026  . Hepatitis C Screening  Completed   Health Maintenance After Age 66 After age 34, you are at a higher risk for certain long-term diseases and infections as well as injuries from falls. Falls are a major cause of broken bones and head injuries in people who are older than age 28. Getting regular preventive care can help to keep you healthy and well. Preventive care includes getting regular testing and making lifestyle changes as recommended by your health care provider. Talk with your health care provider about:  Which screenings and tests you should have. A screening is a test that checks for a disease when you have no symptoms.  A diet and exercise plan that is right for you. What should I know about screenings and tests to prevent falls? Screening and testing are the best ways to find a health problem early. Early diagnosis and treatment give you the best chance of managing medical conditions that are common after age 10. Certain conditions and lifestyle choices may make you more likely to have a fall. Your health care provider may recommend:  Regular vision checks. Poor vision and conditions such as cataracts can make you more likely to have a fall. If you wear glasses, make sure to get your prescription updated if your vision changes.  Medicine review. Work with your health care provider to regularly review all of the medicines you are taking, including over-the-counter medicines. Ask your health care provider about any side effects that may make you more likely to have a fall. Tell your health care provider if any medicines that you take make you feel dizzy or sleepy.  Osteoporosis screening. Osteoporosis is a condition that  causes the bones to get weaker. This can make the bones weak and cause them to break more easily.  Blood pressure screening. Blood pressure changes and medicines to control blood pressure can make you feel dizzy.  Strength and balance checks. Your health care provider may recommend certain tests to check your strength and balance while standing, walking, or changing positions.  Foot health exam. Foot pain and numbness, as well as not wearing proper footwear, can make you more likely to have a fall.  Depression screening. You may be more likely to have a fall if you have a fear of falling, feel emotionally low, or feel unable to do activities that you used to do.  Alcohol use screening. Using too much alcohol can affect your balance and may make you more likely to have a fall. What actions can I take to lower my risk of falls? General instructions  Talk with your health care provider about your risks for falling. Tell your health care provider if: ? You fall. Be sure to tell your health care provider about all falls, even ones that seem minor. ? You feel dizzy, sleepy, or off-balance.  Take over-the-counter and prescription medicines only as told by your health care provider. These include any supplements.  Eat a healthy diet and maintain a healthy weight. A healthy diet includes low-fat dairy products, low-fat (lean) meats, and fiber from whole grains, beans, and lots of fruits and vegetables. Home safety  Remove any tripping hazards, such as rugs,  cords, and clutter.  Install safety equipment such as grab bars in bathrooms and safety rails on stairs.  Keep rooms and walkways well-lit. Activity   Follow a regular exercise program to stay fit. This will help you maintain your balance. Ask your health care provider what types of exercise are appropriate for you.  If you need a cane or walker, use it as recommended by your health care provider.  Wear supportive shoes that have nonskid  soles. Lifestyle  Do not drink alcohol if your health care provider tells you not to drink.  If you drink alcohol, limit how much you have: ? 0-1 drink a day for women. ? 0-2 drinks a day for men.  Be aware of how much alcohol is in your drink. In the [place] one drink equals one typical bottle of beer (12 oz), one-half glass of wine (5 oz), or one shot of hard liquor (1 oz).  Do not use any products that contain nicotine or tobacco, such as cigarettes and e-cigarettes. If you need help quitting, ask your health care provider. Summary  Having a healthy lifestyle and getting preventive care can help to protect your health and wellness after age 53.  Screening and testing are the best way to find a health problem early and help you avoid having a fall. Early diagnosis and treatment give you the best chance for managing medical conditions that are more common for people who are older than age 70.  Falls are a major cause of broken bones and head injuries in people who are older than age 89. Take precautions to prevent a fall at home.  Work with your health care provider to learn what changes you can make to improve your health and wellness and to prevent falls. This information is not intended to replace advice given to you by your health care provider. Make sure you discuss any questions you have with your health care provider. Document Released: 07/13/2017 Document Revised: 07/13/2017 Document Reviewed: 07/13/2017 Elsevier Interactive Patient Education  2019 [employer].

## 2019-02-14 ENCOUNTER — Telehealth: Payer: Self-pay

## 2019-02-14 NOTE — Telephone Encounter (Signed)
Left message for pt to call back----need to know why he hasnt done his cologard

## 2019-02-28 DIAGNOSIS — Z1211 Encounter for screening for malignant neoplasm of colon: Secondary | ICD-10-CM | POA: Diagnosis not present

## 2019-02-28 DIAGNOSIS — Z1212 Encounter for screening for malignant neoplasm of rectum: Secondary | ICD-10-CM | POA: Diagnosis not present

## 2019-03-06 LAB — COLOGUARD: Cologuard: NEGATIVE

## 2019-03-09 LAB — COLOGUARD: Cologuard: NEGATIVE

## 2019-03-13 ENCOUNTER — Encounter: Payer: Self-pay | Admitting: Internal Medicine

## 2019-04-04 ENCOUNTER — Telehealth: Payer: Self-pay

## 2019-04-04 NOTE — Telephone Encounter (Signed)
SAMPLES GIVEN FOR JANUMET

## 2019-04-13 ENCOUNTER — Other Ambulatory Visit: Payer: Self-pay

## 2019-04-13 DIAGNOSIS — E119 Type 2 diabetes mellitus without complications: Secondary | ICD-10-CM

## 2019-04-18 ENCOUNTER — Ambulatory Visit: Payer: Self-pay

## 2019-04-18 DIAGNOSIS — E119 Type 2 diabetes mellitus without complications: Secondary | ICD-10-CM

## 2019-04-18 DIAGNOSIS — I10 Essential (primary) hypertension: Secondary | ICD-10-CM

## 2019-04-18 NOTE — Chronic Care Management (AMB) (Signed)
  Chronic Care Management   Outreach Note  04/18/2019 Name: Marcus Davis MRN: [id number] DOB: 1951-01-11  Referred by: Glendale Chard, MD Reason for referral : Care Coordination   An unsuccessful telephone outreach was attempted today. The patient was referred to the case management team by for assistance with chronic care management and care coordination.   Follow Up Plan: A HIPPA compliant phone message was left for the patient providing contact information and requesting a return call.  The care management team will reach out to the patient again over the next 10 days.   Daneen Schick, BSW, CDP Social Worker, Certified Dementia Practitioner [place] [facility] [place] [facility] [phone]

## 2019-04-26 ENCOUNTER — Ambulatory Visit: Payer: Self-pay

## 2019-04-26 DIAGNOSIS — I10 Essential (primary) hypertension: Secondary | ICD-10-CM

## 2019-04-26 DIAGNOSIS — E119 Type 2 diabetes mellitus without complications: Secondary | ICD-10-CM

## 2019-04-26 NOTE — Chronic Care Management (AMB) (Signed)
  Chronic Care Management   Telephone Outreach Note  04/26/2019 Name: Marcus Davis MRN: [id number] DOB: 11-27-1950  Referred by: Minette Brine, FNP   I reached out to Marcus Davis today by phone in response to a referral sent by Minette Brine, FNP whom is a colleauge of the patients provider. During today's call I spoke with the patients spouse, Marcus Davis. I briefly discussed care management needs related to DMII  [name] was given information about Chronic Care Management services today related to the patient care needs including:  1. CCM service includes personalized support from designated clinical staff supervised by his physician, including individualized plan of care and coordination with other care providers 2. 24/7 contact phone numbers for assistance for urgent and routine care needs. 3. Service will only be billed when office clinical staff spend 20 minutes or more in a month to coordinate care. 4. Only one practitioner may furnish and bill the service in a calendar month. 5. The patient may stop CCM services at any time (effective at the end of the month) by phone call to the office staff. 6. The patient will be responsible for cost sharing (co-pay) of up to 20% of the service fee (after annual deductible is met).  Patient agreed to services and verbal consent obtained. No SW needs were identified during today's call. Marcus Davis does report needing medication assistance in relation to patients diabetic medications.  No follow up by CCM SW planned at this time. CCM SW has communicated patients enrollment status to the CCM team whom will follow up with the patient over the next 2-3 weeks.  Glendale Chard, MD has been notified of this outreach and Marcus Davis decision and plan.   Daneen Schick, BSW, CDP Social Worker, Certified Dementia Practitioner [place] [facility] [place] [facility] [phone]

## 2019-04-26 NOTE — Patient Instructions (Signed)
Social Worker Visit Information   Mr. Lamarque was given information about Chronic Care Management services today including:  1. CCM service includes personalized support from designated clinical staff supervised by his physician, including individualized plan of care and coordination with other care providers 2. 24/7 contact phone numbers for assistance for urgent and routine care needs. 3. Service will only be billed when office clinical staff spend 20 minutes or more in a month to coordinate care. 4. Only one practitioner may furnish and bill the service in a calendar month. 5. The patient may stop CCM services at any time (effective at the end of the month) by phone call to the office staff. 6. The patient will be responsible for cost sharing (co-pay) of up to 20% of the service fee (after annual deductible is met).  Patient agreed to services and verbal consent obtained.   The patient verbalized understanding of instructions provided today and declined a print copy of patient instruction materials.   Follow up plan: No SW follow up planned at this time. The patient will be contacted by embedded PharmD and CCM RN Case Manager over the next 2-3 weeks.   Daneen Schick, BSW, CDP Social Worker, Certified Dementia Practitioner [facility] [phone]

## 2019-05-02 ENCOUNTER — Encounter: Payer: Self-pay | Admitting: Internal Medicine

## 2019-05-02 ENCOUNTER — Other Ambulatory Visit: Payer: Self-pay

## 2019-05-02 ENCOUNTER — Ambulatory Visit (INDEPENDENT_AMBULATORY_CARE_PROVIDER_SITE_OTHER): Payer: Medicare HMO | Admitting: Internal Medicine

## 2019-05-02 VITALS — BP 122/64 | HR 100 | Temp 98.5°F | Ht 69.0 in | Wt 199.0 lb

## 2019-05-02 DIAGNOSIS — E663 Overweight: Secondary | ICD-10-CM

## 2019-05-02 DIAGNOSIS — E119 Type 2 diabetes mellitus without complications: Secondary | ICD-10-CM

## 2019-05-02 DIAGNOSIS — I10 Essential (primary) hypertension: Secondary | ICD-10-CM

## 2019-05-02 NOTE — Patient Instructions (Signed)
Diabetes Mellitus and Foot Care Foot care is an important part of your health, especially when you have diabetes. Diabetes may cause you to have problems because of poor blood flow (circulation) to your feet and legs, which can cause your skin to:  Become thinner and drier.  Break more easily.  Heal more slowly.  Peel and crack. You may also have nerve damage (neuropathy) in your legs and feet, causing decreased feeling in them. This means that you may not notice minor injuries to your feet that could lead to more serious problems. Noticing and addressing any potential problems early is the best way to prevent future foot problems. How to care for your feet Foot hygiene  Wash your feet daily with warm water and mild soap. Do not use hot water. Then, pat your feet and the areas between your toes until they are completely dry. Do not soak your feet as this can dry your skin.  Trim your toenails straight across. Do not dig under them or around the cuticle. File the edges of your nails with an emery board or nail file.  Apply a moisturizing lotion or petroleum jelly to the skin on your feet and to dry, brittle toenails. Use lotion that does not contain alcohol and is unscented. Do not apply lotion between your toes. Shoes and socks  Wear clean socks or stockings every day. Make sure they are not too tight. Do not wear knee-high stockings since they may decrease blood flow to your legs.  Wear shoes that fit properly and have enough cushioning. Always look in your shoes before you put them on to be sure there are no objects inside.  To break in new shoes, wear them for just a few hours a day. This prevents injuries on your feet. Wounds, scrapes, corns, and calluses  Check your feet daily for blisters, cuts, bruises, sores, and redness. If you cannot see the bottom of your feet, use a mirror or ask someone for help.  Do not cut corns or calluses or try to remove them with medicine.  If you  find a minor scrape, cut, or break in the skin on your feet, keep it and the skin around it clean and dry. You may clean these areas with mild soap and water. Do not clean the area with peroxide, alcohol, or iodine.  If you have a wound, scrape, corn, or callus on your foot, look at it several times a day to make sure it is healing and not infected. Check for: ? Redness, swelling, or pain. ? Fluid or blood. ? Warmth. ? Pus or a bad smell. General instructions  Do not cross your legs. This may decrease blood flow to your feet.  Do not use heating pads or hot water bottles on your feet. They may burn your skin. If you have lost feeling in your feet or legs, you may not know this is happening until it is too late.  Protect your feet from hot and cold by wearing shoes, such as at the beach or on hot pavement.  Schedule a complete foot exam at least once a year (annually) or more often if you have foot problems. If you have foot problems, report any cuts, sores, or bruises to your health care provider immediately. Contact a health care provider if:  You have a medical condition that increases your risk of infection and you have any cuts, sores, or bruises on your feet.  You have an injury that is not   healing.  You have redness on your legs or feet.  You feel burning or tingling in your legs or feet.  You have pain or cramps in your legs and feet.  Your legs or feet are numb.  Your feet always feel cold.  You have pain around a toenail. Get help right away if:  You have a wound, scrape, corn, or callus on your foot and: ? You have pain, swelling, or redness that gets worse. ? You have fluid or blood coming from the wound, scrape, corn, or callus. ? Your wound, scrape, corn, or callus feels warm to the touch. ? You have pus or a bad smell coming from the wound, scrape, corn, or callus. ? You have a fever. ? You have a red line going up your leg. Summary  Check your feet every day  for cuts, sores, red spots, swelling, and blisters.  Moisturize feet and legs daily.  Wear shoes that fit properly and have enough cushioning.  If you have foot problems, report any cuts, sores, or bruises to your health care provider immediately.  Schedule a complete foot exam at least once a year (annually) or more often if you have foot problems. This information is not intended to replace advice given to you by your health care provider. Make sure you discuss any questions you have with your health care provider. Document Released: 08/27/2000 Document Revised: 10/12/2017 Document Reviewed: 10/01/2016 Elsevier Patient Education  2020 Elsevier Inc.  

## 2019-05-03 LAB — BMP8+EGFR
BUN/Creatinine Ratio: 11 (ref 10–24)
BUN: 12 mg/dL (ref 8–27)
CO2: 21 mmol/L (ref 20–29)
Calcium: 9.4 mg/dL (ref 8.6–10.2)
Chloride: 103 mmol/L (ref 96–106)
Creatinine, Ser: 1.09 mg/dL (ref 0.76–1.27)
GFR calc Af Amer: 81 mL/min/{1.73_m2} (ref >59)
GFR calc non Af Amer: 70 mL/min/{1.73_m2} (ref >59)
Glucose: 155 mg/dL — ABNORMAL HIGH (ref 65–99)
Potassium: 4.3 mmol/L (ref 3.5–5.2)
Sodium: 140 mmol/L (ref 134–144)

## 2019-05-03 LAB — HEMOGLOBIN A1C
Est. average glucose Bld gHb Est-mCnc: 131 mg/dL
Hgb A1c MFr Bld: 6.2 % — ABNORMAL HIGH (ref 4.8–5.6)

## 2019-05-07 NOTE — Progress Notes (Signed)
Subjective:     Patient ID: Marcus Davis , male    DOB: 10/22/1950 , 68 y.o.   MRN: [id number]   Chief Complaint  Patient presents with  . Diabetes  . Hypertension    HPI  Diabetes He presents for his follow-up diabetic visit. He has type 2 diabetes mellitus. His disease course has been stable. There are no hypoglycemic associated symptoms. Pertinent negatives for hypoglycemia include no headaches. Pertinent negatives for diabetes include no blurred vision and no chest pain. There are no hypoglycemic complications. Risk factors for coronary artery disease include diabetes mellitus, hypertension and male sex. He is following a diabetic diet. He participates in exercise three times a week. An ACE inhibitor/angiotensin II receptor blocker is being taken. Eye exam is not current.  Hypertension This is a chronic problem. The current episode started more than 1 year ago. The problem has been gradually improving since onset. The problem is controlled. Pertinent negatives include no blurred vision, chest pain, headaches or shortness of breath. Past treatments include ACE inhibitors and diuretics. The current treatment provides moderate improvement.     Past Medical History:  Diagnosis Date  . DM (diabetes mellitus) ([place])   . HTN (hypertension)      Family History  Problem Relation Age of Onset  . Diabetes Mother   . Hypertension Mother   . Stroke Mother   . Hypertension Father   . Diabetes Father   . Stroke Paternal Grandfather      Current Outpatient Medications:  .  amLODipine (NORVASC) 10 MG tablet, Take by mouth., Disp: , Rfl:  .  aspirin EC 81 MG tablet, Take 81 mg by mouth daily., Disp: , Rfl:  .  atorvastatin (LIPITOR) 40 MG tablet, Take 40 mg by mouth daily. 1/2 tablet daily, Disp: , Rfl:  .  hydrochlorothiazide (HYDRODIURIL) 25 MG tablet, Take by mouth., Disp: , Rfl:  .  lisinopril (PRINIVIL,ZESTRIL) 40 MG tablet, Take by mouth., Disp: , Rfl:  .  sitaGLIPtin-metformin  (JANUMET) 50-1000 MG tablet, Take by mouth., Disp: , Rfl:  .  Vitamin D, Cholecalciferol, 25 MCG (1000 UT) CAPS, Take by mouth., Disp: , Rfl:    Allergies  Allergen Reactions  . Pollen Extract Other (See Comments)    Sneezing, runny nose, congestion,      Review of Systems  Constitutional: Negative.   Eyes: Negative for blurred vision.  Respiratory: Negative.  Negative for shortness of breath.   Cardiovascular: Negative.  Negative for chest pain.  Gastrointestinal: Negative.   Neurological: Negative.  Negative for headaches.  Psychiatric/Behavioral: Negative.      Today's Vitals   05/02/19 1428  BP: 122/64  Pulse: 100  Temp: 98.5 F (36.9 C)  TempSrc: Oral  Weight: 199 lb (90.3 kg)  Height: _0  (1.753 m)  PainSc: 0-No pain   Body mass index is 29.39 kg/m.   Objective:  Physical Exam Vitals signs and nursing note reviewed.  Constitutional:      Appearance: Normal appearance.  Cardiovascular:     Rate and Rhythm: Normal rate and regular rhythm.     Heart sounds: Normal heart sounds.  Pulmonary:     Effort: Pulmonary effort is normal.     Breath sounds: Normal breath sounds.  Skin:    General: Skin is warm.  Neurological:     General: No focal deficit present.     Mental Status: He is alert.  Psychiatric:        Mood and Affect: Mood normal.  Assessment And Plan:     1. Type 2 diabetes mellitus without complication, without long-term current use of insulin ([place])  I will check labs as listed below. He is encouraged to exercise no less than five days weekly. He has yet to have eye exam this year. He has requested referral. This was placed per his request.   - BMP8+EGFR - Hemoglobin A1c - Ambulatory referral to Ophthalmology  2. Essential hypertension  Chronic, well controlled. He will continue with current meds.  His previous results for his renal function were reviewed. We also discussed ways to prevent onset of CKD.   3. Overweight (BMI  25.0-29.9)  BMI is 29.  He is encouraged to strive for BMI less than 26 to decrease cardiac risk. He is encouraged to exercise no less than five days weekly for at least 30 minutes.   [name], MD    THE PATIENT IS ENCOURAGED TO PRACTICE SOCIAL DISTANCING DUE TO THE COVID-19 PANDEMIC.

## 2019-05-08 ENCOUNTER — Telehealth: Payer: Self-pay

## 2019-05-22 ENCOUNTER — Other Ambulatory Visit: Payer: Self-pay

## 2019-05-22 ENCOUNTER — Encounter: Payer: Self-pay | Admitting: Internal Medicine

## 2019-05-22 ENCOUNTER — Ambulatory Visit (INDEPENDENT_AMBULATORY_CARE_PROVIDER_SITE_OTHER): Payer: Medicare HMO | Admitting: Internal Medicine

## 2019-05-22 VITALS — BP 146/78 | HR 90 | Temp 98.5°F | Ht 68.8 in | Wt 198.6 lb

## 2019-05-22 DIAGNOSIS — I10 Essential (primary) hypertension: Secondary | ICD-10-CM | POA: Diagnosis not present

## 2019-05-22 DIAGNOSIS — L03115 Cellulitis of right lower limb: Secondary | ICD-10-CM

## 2019-05-22 MED ORDER — CEFTRIAXONE SODIUM 1 G IJ SOLR
1.0000 g | Freq: Once | INTRAMUSCULAR | Status: AC
Start: 2019-05-22 — End: 2019-05-22
  Administered 2019-05-22: 1 g via INTRAMUSCULAR

## 2019-05-22 MED ORDER — CEFTRIAXONE SODIUM 500 MG IJ SOLR: 500.0000 mg | Freq: Once | INTRAMUSCULAR | Status: DC

## 2019-05-22 MED ORDER — CEPHALEXIN 500 MG PO CAPS: 500.0000 mg | ORAL_CAPSULE | Freq: Three times a day (TID) | ORAL | 0 refills | Status: AC

## 2019-05-23 LAB — CBC WITH DIFFERENTIAL/PLATELET
Basophils Absolute: 0.1 10*3/uL (ref 0.0–0.2)
Basos: 1 %
EOS (ABSOLUTE): 0.3 10*3/uL (ref 0.0–0.4)
Eos: 4 %
Hematocrit: 36.9 % — ABNORMAL LOW (ref 37.5–51.0)
Hemoglobin: 13.1 g/dL (ref 13.0–17.7)
Immature Grans (Abs): 0 10*3/uL (ref 0.0–0.1)
Immature Granulocytes: 1 %
Lymphocytes Absolute: 1.5 10*3/uL (ref 0.7–3.1)
Lymphs: 24 %
MCH: 32.7 pg (ref 26.6–33.0)
MCHC: 35.5 g/dL (ref 31.5–35.7)
MCV: 92 fL (ref 79–97)
Monocytes Absolute: 0.5 10*3/uL (ref 0.1–0.9)
Monocytes: 8 %
Neutrophils Absolute: 3.9 10*3/uL (ref 1.4–7.0)
Neutrophils: 62 %
Platelets: 270 10*3/uL (ref 150–450)
RBC: 4.01 x10E6/uL — ABNORMAL LOW (ref 4.14–5.80)
RDW: 12.3 % (ref 11.6–15.4)
WBC: 6.3 10*3/uL (ref 3.4–10.8)

## 2019-05-24 ENCOUNTER — Telehealth: Payer: Self-pay

## 2019-05-24 DIAGNOSIS — H2512 Age-related nuclear cataract, left eye: Secondary | ICD-10-CM | POA: Diagnosis not present

## 2019-05-24 DIAGNOSIS — H26491 Other secondary cataract, right eye: Secondary | ICD-10-CM | POA: Diagnosis not present

## 2019-05-24 DIAGNOSIS — E113393 Type 2 diabetes mellitus with moderate nonproliferative diabetic retinopathy without macular edema, bilateral: Secondary | ICD-10-CM | POA: Diagnosis not present

## 2019-05-24 DIAGNOSIS — H35371 Puckering of macula, right eye: Secondary | ICD-10-CM | POA: Diagnosis not present

## 2019-05-24 LAB — HM DIABETES EYE EXAM

## 2019-05-26 ENCOUNTER — Encounter: Payer: Self-pay | Admitting: Internal Medicine

## 2019-05-27 NOTE — Progress Notes (Signed)
Subjective:     Patient ID: Marcus Davis , male    DOB: 03/25/1951 , 68 y.o.   MRN: [id number]   Chief Complaint  Patient presents with  . Edema    patient stated his leg starts to swell up during the day.    HPI  He presents today for further evaluation of right lower extremity swelling. He states it started swelling about two weeks ago. He denies change in his eating habits. He denies chest pain and orthopnea. He denies fever/chills.  There is associated RLE pain. There is some pain with ambulation.     Past Medical History:  Diagnosis Date  . DM (diabetes mellitus) ([facility])   . HTN (hypertension)      Family History  Problem Relation Age of Onset  . Diabetes Mother   . Hypertension Mother   . Stroke Mother   . Hypertension Father   . Diabetes Father   . Stroke Paternal Grandfather      Current Outpatient Medications:  .  amLODipine (NORVASC) 10 MG tablet, Take by mouth., Disp: , Rfl:  .  aspirin EC 81 MG tablet, Take 81 mg by mouth daily., Disp: , Rfl:  .  atorvastatin (LIPITOR) 40 MG tablet, Take 40 mg by mouth daily. 1/2 tablet daily, Disp: , Rfl:  .  hydrochlorothiazide (HYDRODIURIL) 25 MG tablet, Take by mouth., Disp: , Rfl:  .  lisinopril (PRINIVIL,ZESTRIL) 40 MG tablet, Take by mouth., Disp: , Rfl:  .  sitaGLIPtin-metformin (JANUMET) 50-1000 MG tablet, Take by mouth., Disp: , Rfl:  .  Vitamin D, Cholecalciferol, 25 MCG (1000 UT) CAPS, Take by mouth., Disp: , Rfl:  .  cephALEXin (KEFLEX) 500 MG capsule, Take 1 capsule (500 mg total) by mouth 3 (three) times daily for 10 days., Disp: 30 capsule, Rfl: 0   Allergies  Allergen Reactions  . Pollen Extract Other (See Comments)    Sneezing, runny nose, congestion,      Review of Systems  Constitutional: Negative.   Respiratory: Negative.   Cardiovascular: Positive for leg swelling.  Gastrointestinal: Negative.   Neurological: Negative.   Psychiatric/Behavioral: Negative.      Today's Vitals   05/22/19 1254   BP: (!) 146/78  Pulse: 90  Temp: 98.5 F (36.9 C)  TempSrc: Oral  Weight: 198 lb 9.6 oz (90.1 kg)  Height: 5' 8.8" (1.748 m)  PainSc: 0-No pain   Body mass index is 29.5 kg/m.   Objective:  Physical Exam Vitals signs and nursing note reviewed.  Constitutional:      Appearance: Normal appearance.  Cardiovascular:     Rate and Rhythm: Normal rate and regular rhythm.     Heart sounds: Normal heart sounds.  Pulmonary:     Effort: Pulmonary effort is normal.     Breath sounds: Normal breath sounds.  Musculoskeletal:     Right lower leg: 3+ Edema present.  Skin:    General: Skin is warm.     Findings: Erythema present.     Comments: He has marked swelling of RLE. Also with redness/warmth. There are several abrasions noted on RLE.   Neurological:     General: No focal deficit present.     Mental Status: He is alert.  Psychiatric:        Mood and Affect: Mood normal.         Assessment And Plan:     1. Cellulitis of right lower extremity  He was given Rocephin, 1gm IM x 1.  He was  also given rx Keflex 500mg  tid x 7 days. He is encouraged to complete full abx course. He is encouraged to rto in one week for re-evaluation. However, he declines. He reports he will come back if there has not been any improvement of his symptoms. He is encouraged to contact me if he develops fever/chills. He is also encouraged to mark outline of rash to determine if it continues to spread. I am unable to mark area today b/c he declined to remove his pants for full exam of right leg.   - CBC with Diff - cefTRIAXone (ROCEPHIN) injection 1 g  2. Essential hypertension  Fair control. He will continue with current meds for now. Elevation possibly related to pain associated with RLE.   [name], MD    THE PATIENT IS ENCOURAGED TO PRACTICE SOCIAL DISTANCING DUE TO THE COVID-19 PANDEMIC.

## 2019-05-29 ENCOUNTER — Ambulatory Visit: Payer: Medicare HMO

## 2019-05-29 ENCOUNTER — Other Ambulatory Visit: Payer: Self-pay

## 2019-05-30 ENCOUNTER — Encounter: Payer: Self-pay | Admitting: Internal Medicine

## 2019-05-30 ENCOUNTER — Ambulatory Visit (INDEPENDENT_AMBULATORY_CARE_PROVIDER_SITE_OTHER): Payer: Medicare HMO | Admitting: Internal Medicine

## 2019-05-30 VITALS — BP 120/70 | HR 90 | Temp 97.7°F | Ht 68.8 in | Wt 195.0 lb

## 2019-05-30 DIAGNOSIS — L03116 Cellulitis of left lower limb: Secondary | ICD-10-CM | POA: Diagnosis not present

## 2019-05-30 DIAGNOSIS — L03115 Cellulitis of right lower limb: Secondary | ICD-10-CM | POA: Diagnosis not present

## 2019-05-30 MED ORDER — CEFTRIAXONE SODIUM 1 G IJ SOLR
1.0000 g | Freq: Once | INTRAMUSCULAR | Status: AC
  Administered 2019-05-30: 12:00:00 1 g via INTRAMUSCULAR

## 2019-05-30 MED ORDER — FLUCONAZOLE 100 MG PO TABS: 100.0000 mg | ORAL_TABLET | Freq: Every day | ORAL | 0 refills | Status: AC

## 2019-05-30 MED ORDER — DOXYCYCLINE HYCLATE 100 MG PO TABS: 100.0000 mg | ORAL_TABLET | Freq: Two times a day (BID) | ORAL | 0 refills | Status: DC

## 2019-05-30 NOTE — Patient Instructions (Signed)
  HOLD atorvastatin while taking diflucan  Please take all of your antibiotics

## 2019-06-03 NOTE — Progress Notes (Signed)
Subjective:     Patient ID: Marcus Davis , male    DOB: 06/15/1951 , 68 y.o.   MRN: [id number]   Chief Complaint  Patient presents with  . Leg Pain    HPI  He is here today for f/u leg pain. He was seen last week and diagnosed with cellulitis. He reports compliance with oral abx. He told medical assistant earlier in week that his sx had improved. Yet, he scheduled appointment to come in for re-evaluation.     Past Medical History:  Diagnosis Date  . DM (diabetes mellitus) ([facility])   . HTN (hypertension)      Family History  Problem Relation Age of Onset  . Diabetes Mother   . Hypertension Mother   . Stroke Mother   . Hypertension Father   . Diabetes Father   . Stroke Paternal Grandfather      Current Outpatient Medications:  .  amLODipine (NORVASC) 10 MG tablet, Take by mouth., Disp: , Rfl:  .  aspirin EC 81 MG tablet, Take 81 mg by mouth daily., Disp: , Rfl:  .  atorvastatin (LIPITOR) 40 MG tablet, Take 40 mg by mouth daily. 1/2 tablet daily, Disp: , Rfl:  .  doxycycline (VIBRA-TABS) 100 MG tablet, Take 1 tablet (100 mg total) by mouth 2 (two) times daily., Disp: 20 tablet, Rfl: 0 .  fluconazole (DIFLUCAN) 100 MG tablet, Take 1 tablet (100 mg total) by mouth daily for 7 days., Disp: 7 tablet, Rfl: 0 .  hydrochlorothiazide (HYDRODIURIL) 25 MG tablet, Take by mouth., Disp: , Rfl:  .  lisinopril (PRINIVIL,ZESTRIL) 40 MG tablet, Take by mouth., Disp: , Rfl:  .  sitaGLIPtin-metformin (JANUMET) 50-1000 MG tablet, Take by mouth., Disp: , Rfl:  .  Vitamin D, Cholecalciferol, 25 MCG (1000 UT) CAPS, Take by mouth., Disp: , Rfl:    Allergies  Allergen Reactions  . Pollen Extract Other (See Comments)    Sneezing, runny nose, congestion,      Review of Systems  Constitutional: Negative.   Respiratory: Negative.   Cardiovascular: Positive for leg swelling.  Gastrointestinal: Negative.   Neurological: Negative.   Psychiatric/Behavioral: Negative.      Today's Vitals   05/30/19 1046  BP: 120/70  Pulse: 90  Temp: 97.7 F (36.5 C)  TempSrc: Oral  SpO2: 97%  Weight: 195 lb (88.5 kg)  Height: 5' 8.8" (1.748 m)   Body mass index is 28.96 kg/m.   Objective:  Physical Exam Vitals signs and nursing note reviewed.  Constitutional:      Appearance: Normal appearance.  Cardiovascular:     Rate and Rhythm: Normal rate and regular rhythm.     Heart sounds: Normal heart sounds.  Pulmonary:     Effort: Pulmonary effort is normal.     Breath sounds: Normal breath sounds.  Skin:    General: Skin is warm.     Comments: RLE with decreased swelling, yet continued redness.  LLE now has warmth, scattered erythematous areas with satellite lesions.   Neurological:     General: No focal deficit present.     Mental Status: He is alert.  Psychiatric:        Mood and Affect: Mood normal.         Assessment And Plan:     1. Cellulitis of right lower extremity  He was given ceftriaxone 1gm and rx doxycycline 100mg  twice daily. He is encouraged to take full course of abx. He will rto next week for re-evaluation. He is  encouraged to go to ER should his sx worsen.   - cefTRIAXone (ROCEPHIN) injection 1 g  2. Cellulitis of left lower leg  This appears to be due to fungal infection. He was given rx fluconazole to take once daily, advised to hold chol meds while on this medication. He will rto in one week for re-evaluation.   [name], MD    THE PATIENT IS ENCOURAGED TO PRACTICE SOCIAL DISTANCING DUE TO THE COVID-19 PANDEMIC.

## 2019-06-04 ENCOUNTER — Other Ambulatory Visit: Payer: Self-pay

## 2019-06-04 ENCOUNTER — Ambulatory Visit (INDEPENDENT_AMBULATORY_CARE_PROVIDER_SITE_OTHER): Payer: Medicare HMO | Admitting: Internal Medicine

## 2019-06-04 ENCOUNTER — Encounter: Payer: Self-pay | Admitting: Internal Medicine

## 2019-06-04 VITALS — BP 124/76 | HR 84 | Temp 97.6°F | Ht 68.8 in | Wt 193.2 lb

## 2019-06-04 DIAGNOSIS — L03115 Cellulitis of right lower limb: Secondary | ICD-10-CM

## 2019-06-04 DIAGNOSIS — E113513 Type 2 diabetes mellitus with proliferative diabetic retinopathy with macular edema, bilateral: Secondary | ICD-10-CM | POA: Diagnosis not present

## 2019-06-04 DIAGNOSIS — H43811 Vitreous degeneration, right eye: Secondary | ICD-10-CM | POA: Diagnosis not present

## 2019-06-04 DIAGNOSIS — H35373 Puckering of macula, bilateral: Secondary | ICD-10-CM | POA: Diagnosis not present

## 2019-06-04 DIAGNOSIS — M7989 Other specified soft tissue disorders: Secondary | ICD-10-CM

## 2019-06-04 DIAGNOSIS — H35033 Hypertensive retinopathy, bilateral: Secondary | ICD-10-CM | POA: Diagnosis not present

## 2019-06-04 MED ORDER — SULFAMETHOXAZOLE-TRIMETHOPRIM 800-160 MG PO TABS: 1.0000 | ORAL_TABLET | Freq: Two times a day (BID) | ORAL | 0 refills | Status: DC

## 2019-06-04 NOTE — Patient Instructions (Signed)

## 2019-06-06 ENCOUNTER — Ambulatory Visit
Admission: RE | Admit: 2019-06-06 | Discharge: 2019-06-06 | Disposition: A | Discharge status: Home / Self Care | Payer: Medicare HMO | Source: Ambulatory Visit | Attending: Internal Medicine | Admitting: Internal Medicine

## 2019-06-06 DIAGNOSIS — R600 Localized edema: Secondary | ICD-10-CM | POA: Diagnosis not present

## 2019-06-06 DIAGNOSIS — M7989 Other specified soft tissue disorders: Secondary | ICD-10-CM

## 2019-06-07 ENCOUNTER — Other Ambulatory Visit: Payer: Self-pay

## 2019-06-07 ENCOUNTER — Other Ambulatory Visit: Payer: Medicare HMO

## 2019-06-07 ENCOUNTER — Telehealth: Payer: Self-pay

## 2019-06-07 DIAGNOSIS — I10 Essential (primary) hypertension: Secondary | ICD-10-CM

## 2019-06-07 NOTE — Progress Notes (Deleted)
Pt was not seen this day

## 2019-06-08 LAB — BMP8+ANION GAP
Anion Gap: 20 mmol/L — ABNORMAL HIGH (ref 10.0–18.0)
BUN/Creatinine Ratio: 11 (ref 10–24)
BUN: 16 mg/dL (ref 8–27)
CO2: 20 mmol/L (ref 20–29)
Calcium: 9.8 mg/dL (ref 8.6–10.2)
Chloride: 100 mmol/L (ref 96–106)
Creatinine, Ser: 1.5 mg/dL — ABNORMAL HIGH (ref 0.76–1.27)
GFR calc Af Amer: 55 mL/min/{1.73_m2} — ABNORMAL LOW (ref >59)
GFR calc non Af Amer: 48 mL/min/{1.73_m2} — ABNORMAL LOW (ref >59)
Glucose: 110 mg/dL — ABNORMAL HIGH (ref 65–99)
Potassium: 5.1 mmol/L (ref 3.5–5.2)
Sodium: 140 mmol/L (ref 134–144)

## 2019-06-12 ENCOUNTER — Other Ambulatory Visit: Payer: Self-pay | Admitting: Internal Medicine

## 2019-06-12 DIAGNOSIS — M7989 Other specified soft tissue disorders: Secondary | ICD-10-CM

## 2019-06-12 DIAGNOSIS — I739 Peripheral vascular disease, unspecified: Secondary | ICD-10-CM

## 2019-06-18 ENCOUNTER — Telehealth: Payer: Self-pay

## 2019-06-19 ENCOUNTER — Ambulatory Visit: Payer: Self-pay | Admitting: Pharmacist

## 2019-06-19 DIAGNOSIS — I10 Essential (primary) hypertension: Secondary | ICD-10-CM

## 2019-06-19 DIAGNOSIS — E119 Type 2 diabetes mellitus without complications: Secondary | ICD-10-CM

## 2019-06-19 NOTE — Progress Notes (Signed)
  Chronic Care Management   Outreach Note  06/19/2019 Name: Marcus Davis MRN: [id number] DOB: [birth year]  Referred by: Glendale Chard, MD Reason for referral : Chronic Care Management   An unsuccessful telephone outreach was attempted today. The patient was referred to the case management team by for assistance with care management and care coordination.   Follow Up Plan: A HIPPA compliant phone message was left for the patient providing contact information and requesting a return call.  The care management team will reach out to the patient again over the next 5-7 business days.   Regina Eck, PharmD, BCPS Clinical Pharmacist, [facility] [place]: [phone]

## 2019-06-22 ENCOUNTER — Telehealth: Payer: Self-pay

## 2019-06-26 ENCOUNTER — Telehealth: Payer: Self-pay

## 2019-07-04 ENCOUNTER — Ambulatory Visit (INDEPENDENT_AMBULATORY_CARE_PROVIDER_SITE_OTHER): Payer: Medicare HMO | Admitting: Pharmacist

## 2019-07-04 DIAGNOSIS — I10 Essential (primary) hypertension: Secondary | ICD-10-CM

## 2019-07-04 DIAGNOSIS — E119 Type 2 diabetes mellitus without complications: Secondary | ICD-10-CM

## 2019-07-08 NOTE — Progress Notes (Signed)
Chronic Care Management   Initial Visit Note  07/04/2019 Name: Marcus Davis MRN: [id number] DOB: 03/31/1951  Referred by: [name], MD Reason for referral : Chronic Care Management   Gerritt Galentine is a 68 y.o. year old male who is a primary care patient of [name], MD. The CCM team was consulted for assistance with chronic disease management and care coordination needs related to HTN, HLD and DMII  Review of patient status, including review of consultants reports, relevant laboratory and other test results, and collaboration with appropriate care team members and the patient's provider was performed as part of comprehensive patient evaluation and provision of chronic care management services.    I spoke with Mr. & Mrs. Brandl by telephone today.  Advanced Directives Status: N See Care Plan and [facility] application for related entries.   Medications: Outpatient Encounter Medications as of 07/04/2019  Medication Sig  . sitaGLIPtin-metformin (JANUMET) 50-1000 MG tablet Take 1 tablet by mouth 2 (two) times daily with a meal.  . amLODipine (NORVASC) 10 MG tablet Take by mouth.  Marland Kitchen aspirin EC 81 MG tablet Take 81 mg by mouth daily.  Marland Kitchen atorvastatin (LIPITOR) 40 MG tablet Take 40 mg by mouth daily. 1/2 tablet daily  . doxycycline (VIBRA-TABS) 100 MG tablet Take 1 tablet (100 mg total) by mouth 2 (two) times daily.  . hydrochlorothiazide (HYDRODIURIL) 25 MG tablet Take by mouth.  Marland Kitchen lisinopril (PRINIVIL,ZESTRIL) 40 MG tablet Take by mouth.  . sulfamethoxazole-trimethoprim (BACTRIM DS) 800-160 MG tablet Take 1 tablet by mouth 2 (two) times daily.  . Vitamin D, Cholecalciferol, 25 MCG (1000 UT) CAPS Take by mouth.  . [DISCONTINUED] sitaGLIPtin-metformin (JANUMET) 50-1000 MG tablet Take by mouth.   No facility-administered encounter medications on file as of 07/04/2019.      Objective:   Goals Addressed            This Visit's Progress     Patient Stated   . I would like to  apply for medication assistance for my diabetes medication (pt-stated)       Current Barriers:  . Financial Barriers: patient has [employer] and reports copay for Janumet is cost prohibitive at this time   Pharmacist Clinical Goal(s):  Marland Kitchen Over the next 30 days, patient will work with PharmD and providers to relieve medication access concerns  Interventions: . Comprehensive medication review completed; medication list updated in electronic medical record.  Thera Flake by [employer]: Patient meets income/NO out of pocket spend criteria for this medication's patient assistance program. Reviewed application process. Patient will provide proof of income, out of pocket spend report, and will sign application. Will collaborate with primary care provider, Dr. [name], for their portion of application. Once completed, will submit to [employer] patient assistance program.  Patient Self Care Activities:  . Patient will provide necessary portions of application   Initial goal documentation     . I would like to optimize medication management of my chronic conditions. (pt-stated)       Current Barriers:  . Diabetes: T2DM; most recent A1c 6.2% on 05/02/19 . Current antihyperglycemic regimen: Janumet 50-1000mg  (patient assistance program through [employer], however patient could likely stay on monotherapy with metformin if patient assistance does not get approved given A1c of 6.2%-- metformin monotherapy as tolerated) . denies hypoglycemic symptoms; denies hyperglycemic symptoms . Current meal patterns: n/a-patient in a hurry today-->will f/u o Breakfast: o Lunch: o Supper: o Snacks o Drinks: . Current exercise:  . Current blood glucose readings:FBG<130 .  Cardiovascular risk reduction: o Current hypertensive regimen: prescribed amlodipine, lisinopril, HCTZ (no fill history) o Current hyperlipidemia regimen: prescribed atorvastatin (no fill history) o Call planned next week to f/u on medication  adherence  Pharmacist Clinical Goal(s):  Marland Kitchen Over the next 90 days, patient with work with PharmD and primary care provider to address needs related to optimized medication management of chronic conditions.  Interventions: . Comprehensive medication review performed, medication list updated in electronic medical record . Marland Kitchen  Patient Self Care Activities:  . Patient will check blood glucose 3 times weekly (ultimately daily), document, and provide at future appointments . Patient will focus on medication adherence by continuing to take medications as prescribed; apply for patient assistance program through [employer] in order to continue Janumet due to financial barriers. . Patient will take medications as prescribed . Patient will contact provider with any episodes of hypoglycemia . Patient will report any questions or concerns to provider   Initial goal documentation         Plan:   The care management team will reach out to the patient again over the next 7 days.   Provider Signature Regina Eck, PharmD, BCPS Clinical Pharmacist, [facility] [place]: [phone]

## 2019-07-08 NOTE — Patient Instructions (Signed)
Visit Information  Goals Addressed            This Visit's Progress     Patient Stated   . I would like to apply for medication assistance for my diabetes medication (pt-stated)       Current Barriers:  . Financial Barriers: patient has [employer] and reports copay for Janumet is cost prohibitive at this time   Pharmacist Clinical Goal(s):  Marland Kitchen Over the next 30 days, patient will work with PharmD and providers to relieve medication access concerns  Interventions: . Comprehensive medication review completed; medication list updated in electronic medical record.  Carlyn Reichert by [employer]: Patient meets income/NO out of pocket spend criteria for this medication's patient assistance program. Reviewed application process. Patient will provide proof of income, out of pocket spend report, and will sign application. Will collaborate with primary care provider, Dr. Glendale Chard, for their portion of application. Once completed, will submit to [employer] patient assistance program.  Patient Self Care Activities:  . Patient will provide necessary portions of application   Initial goal documentation     . I would like to optimize medication management of my chronic conditions. (pt-stated)       Current Barriers:  . Diabetes: T2DM; most recent A1c 6.2% on 05/02/19 . Current antihyperglycemic regimen: Janumet 50-1000mg  (patient assistance program through [employer], however patient could likely stay on monotherapy with metformin if patient assistance does not get approved given A1c of 6.2%-- metformin monotherapy as tolerated) . denies hypoglycemic symptoms; denies hyperglycemic symptoms . Current meal patterns: n/a-patient in a hurry today-->will f/u o Breakfast: o Lunch: o Supper: o Snacks o Drinks: . Current exercise:  . Current blood glucose readings:FBG<130 . Cardiovascular risk reduction: o Current hypertensive regimen: prescribed amlodipine, lisinopril, HCTZ (no fill  history) o Current hyperlipidemia regimen: prescribed atorvastatin (no fill history) o Call planned next week to f/u on medication adherence  Pharmacist Clinical Goal(s):  Marland Kitchen Over the next 90 days, patient with work with PharmD and primary care provider to address needs related to optimized medication management of chronic conditions.  Interventions: . Comprehensive medication review performed, medication list updated in electronic medical record . Marland Kitchen  Patient Self Care Activities:  . Patient will check blood glucose 3 times weekly (ultimately daily), document, and provide at future appointments . Patient will focus on medication adherence by continuing to take medications as prescribed; apply for patient assistance program through [employer] in order to continue Janumet due to financial barriers. . Patient will take medications as prescribed . Patient will contact provider with any episodes of hypoglycemia . Patient will report any questions or concerns to provider   Initial goal documentation        The patient verbalized understanding of instructions provided today and declined a print copy of patient instruction materials.   The care management team will reach out to the patient again over the next 7 days.   SIGNATURE Regina Eck, PharmD, BCPS Clinical Pharmacist, [facility] [place]: [phone]

## 2019-07-08 NOTE — Progress Notes (Signed)
Subjective:     Patient ID: Marcus Davis , male    DOB: Dec 13, 1950 , 68 y.o.   MRN: [id number]   Chief Complaint  Patient presents with  . Follow-up    leg infection    HPI  He is here today for f/u LE cellulitis. He feels swelling on RLE has worsened. He denies SOB. He denies leg pain, just that his leg feels heavy.     Past Medical History:  Diagnosis Date  . DM (diabetes mellitus) (HCC)   . HTN (hypertension)      Family History  Problem Relation Age of Onset  . Diabetes Mother   . Hypertension Mother   . Stroke Mother   . Hypertension Father   . Diabetes Father   . Stroke Paternal Grandfather      Current Outpatient Medications:  .  amLODipine (NORVASC) 10 MG tablet, Take by mouth., Disp: , Rfl:  .  aspirin EC 81 MG tablet, Take 81 mg by mouth daily., Disp: , Rfl:  .  atorvastatin (LIPITOR) 40 MG tablet, Take 40 mg by mouth daily. 1/2 tablet daily, Disp: , Rfl:  .  doxycycline (VIBRA-TABS) 100 MG tablet, Take 1 tablet (100 mg total) by mouth 2 (two) times daily., Disp: 20 tablet, Rfl: 0 .  hydrochlorothiazide (HYDRODIURIL) 25 MG tablet, Take by mouth., Disp: , Rfl:  .  lisinopril (PRINIVIL,ZESTRIL) 40 MG tablet, Take by mouth., Disp: , Rfl:  .  Vitamin D, Cholecalciferol, 25 MCG (1000 UT) CAPS, Take by mouth., Disp: , Rfl:  .  sitaGLIPtin-metformin (JANUMET) 50-1000 MG tablet, Take 1 tablet by mouth 2 (two) times daily with a meal., Disp: , Rfl:  .  sulfamethoxazole-trimethoprim (BACTRIM DS) 800-160 MG tablet, Take 1 tablet by mouth 2 (two) times daily., Disp: 20 tablet, Rfl: 0   Allergies  Allergen Reactions  . Pollen Extract Other (See Comments)    Sneezing, runny nose, congestion,      Review of Systems  Constitutional: Negative.   Respiratory: Negative.   Cardiovascular: Positive for leg swelling.  Gastrointestinal: Negative.   Neurological: Negative.   Psychiatric/Behavioral: Negative.      Today's Vitals   06/04/19 1212  BP: 124/76  Pulse: 84   Temp: 97.6 F (36.4 C)  TempSrc: Oral  Weight: 193 lb 3.2 oz (87.6 kg)  Height: 5' 8.8" (1.748 m)   Body mass index is 28.7 kg/m.   Objective:  Physical Exam Vitals signs and nursing note reviewed.  Constitutional:      Appearance: Normal appearance.  Cardiovascular:     Rate and Rhythm: Normal rate and regular rhythm.     Heart sounds: Normal heart sounds.  Pulmonary:     Effort: Pulmonary effort is normal.     Breath sounds: Normal breath sounds.  Musculoskeletal:     Right lower leg: Edema present.     Left lower leg: No edema.  Skin:    General: Skin is warm.  Neurological:     General: No focal deficit present.     Mental Status: He is alert.  Psychiatric:        Mood and Affect: Mood normal.         Assessment And Plan:     1. Cellulitis of right lower extremity  I will start him on Bactrim twice daily. He is encouraged to stay well hydrated and to complete full abx course. He is encouraged to contact me in a week to let me know how he is doing.  2. Swelling of right lower extremity  I will refer him for venous u/s of RLE to r/o DVT .  - US Venous Img Lower Unilateral Right; Future        [name], MD    THE PATIENT IS ENCOURAGED TO PRACTICE SOCIAL DISTANCING DUE TO THE COVID-19 PANDEMIC.

## 2019-07-10 ENCOUNTER — Telehealth: Payer: Self-pay

## 2019-07-10 NOTE — Telephone Encounter (Signed)
-----   Message from Glendale Chard, MD sent at 07/08/2019 12:34 PM EDT ----- Please check on patient. How is his leg? Has he seen vascular MD yet? I know he had u/s to rule out clot.   Is he more active?

## 2019-07-10 NOTE — Telephone Encounter (Signed)
Called pt but Unable to leave vm.    Please check on patient. How is his leg? Has he seen vascular MD yet? I know he had u/s to rule out clot.   Is he more active?

## 2019-07-11 ENCOUNTER — Other Ambulatory Visit: Payer: Self-pay

## 2019-07-11 DIAGNOSIS — M7989 Other specified soft tissue disorders: Secondary | ICD-10-CM

## 2019-07-13 ENCOUNTER — Encounter: Payer: Medicare HMO | Admitting: Vascular Surgery

## 2019-07-13 ENCOUNTER — Telehealth: Payer: Self-pay

## 2019-07-13 ENCOUNTER — Encounter (HOSPITAL_COMMUNITY): Payer: Medicare HMO

## 2019-07-17 ENCOUNTER — Ambulatory Visit: Payer: Medicare HMO | Admitting: Vascular Surgery

## 2019-07-17 ENCOUNTER — Encounter: Payer: Self-pay | Admitting: Vascular Surgery

## 2019-07-17 ENCOUNTER — Other Ambulatory Visit: Payer: Self-pay

## 2019-07-17 ENCOUNTER — Ambulatory Visit (HOSPITAL_COMMUNITY)
Admission: RE | Admit: 2019-07-17 | Discharge: 2019-07-17 | Disposition: A | Discharge status: Home / Self Care | Payer: Medicare HMO | Source: Ambulatory Visit | Attending: Family | Admitting: Family

## 2019-07-17 VITALS — BP 132/80 | HR 85 | Temp 97.2°F | Resp 20 | Ht 68.5 in | Wt 193.9 lb

## 2019-07-17 DIAGNOSIS — M7989 Other specified soft tissue disorders: Secondary | ICD-10-CM | POA: Diagnosis not present

## 2019-07-17 DIAGNOSIS — I890 Lymphedema, not elsewhere classified: Secondary | ICD-10-CM | POA: Diagnosis not present

## 2019-07-17 NOTE — Progress Notes (Signed)
[facility]  Patient name: Marcus Davis Jacober MRN: [id number] DOB: 10/17/1950 Sex: male  REASON FOR CONSULT: Evaluation swelling of right leg.  Rule out arterial insufficiency  HPI: Marcus Davis Ruggles is a 68 y.o. male, who is here today for evaluation of right leg swelling.  He reports that this is been present for months although it looks much more chronic than this.  He does have mild swelling in his left leg as well.  He had a negative venous duplex for DVT in September 2020 which I have reviewed.  He reports that this is progressive throughout the day but never has resolution of the swelling.  Swelling does not go down onto his forefoot and also extends out into his toes on the right foot.  He has no history of DVT.  He has been treated with course of antibiotics for possible cellulitis.  No history of congestive heart failure.  Past Medical History:  Diagnosis Date  . Allergy   . DM (diabetes mellitus) (HCC)   . HTN (hypertension)   . Hyperlipidemia     Family History  Problem Relation Age of Onset  . Diabetes Mother   . Hypertension Mother   . Stroke Mother   . Hypertension Father   . Diabetes Father   . Stroke Paternal Grandfather     SOCIAL HISTORY: Social History   Socioeconomic History  . Marital status: Married    Spouse name: Not on file  . Number of children: Not on file  . Years of education: Not on file  . Highest education level: Not on file  Occupational History  . Occupation: retired  Engineer, productionocial Needs  . Financial resource strain: Not hard at all  . Food insecurity    Worry: Never true    Inability: Never true  . Transportation needs    Medical: No    Non-medical: No  Tobacco Use  . Smoking status: Never Smoker  . Smokeless tobacco: Never Used  Substance and Sexual Activity  . Alcohol use: Not Currently    Comment: Holidays  . Drug use: Never  . Sexual activity: Yes  Lifestyle  . Physical  activity    Days per week: 3 days    Minutes per session: 150+ min  . Stress: Not at all  Relationships  . Social Musicianconnections    Talks on phone: Not on file    Gets together: Not on file    Attends religious service: Not on file    Active member of club or organization: Not on file    Attends meetings of clubs or organizations: Not on file    Relationship status: Not on file  . Intimate partner violence    Fear of current or ex partner: No    Emotionally abused: No    Physically abused: No    Forced sexual activity: No  Other Topics Concern  . Not on file  Social History Narrative  . Not on file    Allergies  Allergen Reactions  . Pollen Extract Other (See Comments)    Sneezing, runny nose, congestion,     Current Outpatient Medications  Medication Sig Dispense Refill  . amLODipine (NORVASC) 10 MG tablet Take by mouth.    Marland Kitchen. aspirin EC 81 MG tablet Take 81 mg by mouth daily.    Marland Kitchen. atorvastatin (LIPITOR) 40 MG tablet Take 40 mg by mouth daily. 1/2 tablet daily    . hydrochlorothiazide (HYDRODIURIL) 25 MG tablet Take by  mouth.    . lisinopril (PRINIVIL,ZESTRIL) 40 MG tablet Take by mouth.    . sitaGLIPtin-metformin (JANUMET) 50-1000 MG tablet Take 1 tablet by mouth 2 (two) times daily with a meal.    . Vitamin D, Cholecalciferol, 25 MCG (1000 UT) CAPS Take by mouth.     No current facility-administered medications for this visit.     REVIEW OF SYSTEMS:  [X]  denotes positive finding, [ ]  denotes negative finding Cardiac  Comments:  Chest pain or chest pressure:    Shortness of breath upon exertion:    Short of breath when lying flat:    Irregular heart rhythm:        Vascular    Pain in calf, thigh, or hip brought on by ambulation: x   Pain in feet at night that wakes you up from your sleep:     Blood clot in your veins:    Leg swelling:  x       Pulmonary    Oxygen at home:    Productive cough:     Wheezing:         Neurologic    Sudden weakness in arms or  legs:     Sudden numbness in arms or legs:     Sudden onset of difficulty speaking or slurred speech:    Temporary loss of vision in one eye:     Problems with dizziness:         Gastrointestinal    Blood in stool:     Vomited blood:         Genitourinary    Burning when urinating:     Blood in urine:        Psychiatric    Major depression:         Hematologic    Bleeding problems:    Problems with blood clotting too easily:        Skin    Rashes or ulcers:        Constitutional    Fever or chills:      PHYSICAL EXAM: Vitals:   07/17/19 0938  BP: 132/80  Pulse: 85  Resp: 20  Temp: (!) 97.2 F (36.2 C)  SpO2: 100%  Weight: 193 lb 14.4 oz (88 kg)  Height: 5' 8.5" (1.74 m)    GENERAL: The patient is a well-nourished male, in no acute distress. The vital signs are documented above. CARDIOVASCULAR: 2+ radial pulses bilaterally.  2+ femoral and 2+ dorsalis pedis pulses.  Pulses on the right are somewhat diminished due to thickness but are palpable. PULMONARY: There is good air exchange  ABDOMEN: Soft and non-tender  MUSCULOSKELETAL: There are no major deformities or cyanosis. NEUROLOGIC: No focal weakness or paresthesias are detected. SKIN: There are no ulcers or rashes noted.  Does have thickening in his skin from his knee distally on the right and this does extend onto his foot and toes.  Does have discoloration of the skin as well.  PSYCHIATRIC: The patient has a normal affect.  DATA:  Noninvasive arterial studies today reveal normal pressures and waveforms bilaterally  Venous duplex to rule out DVT in September 2020 was normal in the right leg  MEDICAL ISSUES: Clinically the patient seems to have primary lymphedema and is right leg.  He does have extension into his foot toes and his swelling is relatively fixed.  The skin is becoming thicker as well.  He did not have venous reflux study.  I did image his veins with SonoSite  ultrasound and his saphenous veins are  not enlarged.  I explained to be very little chance that he would have any benefit from attempted ablation of the saphenous vein since there is no enlargement.  I did explain the critical importance of graduated compression garments.  Recommended 20 to 30 mmHg and explained the need to wear these on a daily basis.  We did provide information regarding [employer] in [place] for a wholesale option for stockings.  He understands and will see [place] again on an as-needed basis.  Also understands importance of elevation of his lower extremity when possible   Larina Earthly, MD [facility] Vascular and Vein Specialists of [facility] Tel [phone] Pager [phone]

## 2019-07-23 ENCOUNTER — Telehealth: Payer: Self-pay

## 2019-07-27 ENCOUNTER — Encounter: Payer: Medicare HMO | Admitting: Vascular Surgery

## 2019-07-27 ENCOUNTER — Encounter (HOSPITAL_COMMUNITY): Payer: Medicare HMO

## 2019-08-03 ENCOUNTER — Telehealth: Payer: Self-pay

## 2019-08-07 ENCOUNTER — Telehealth: Payer: Self-pay

## 2019-08-08 NOTE — Progress Notes (Signed)
This encounter was created in error - please disregard.

## 2019-08-15 NOTE — Progress Notes (Signed)
This encounter was created in error - please disregard.

## 2019-08-17 ENCOUNTER — Encounter: Payer: Medicare HMO | Admitting: Vascular Surgery

## 2019-08-17 ENCOUNTER — Encounter (HOSPITAL_COMMUNITY): Payer: Medicare HMO

## 2019-08-24 ENCOUNTER — Ambulatory Visit: Payer: Self-pay

## 2019-08-25 NOTE — Progress Notes (Signed)
  Chronic Care Management   Outreach Note  08/24/2019 Name: Marcus Davis MRN: [id number] DOB: 1950/11/08  Referred by: Glendale Chard, MD Reason for referral : Chronic Care Management   An unsuccessful telephone outreach was attempted today. The patient was referred to the case management team by for assistance with care management and care coordination.   Follow Up Plan: A HIPPA compliant phone message was left for the patient providing contact information and requesting a return call.  The care management team will reach out to the patient again over the next 7 days.   SIGNATURE Regina Eck, PharmD, BCPS Clinical Pharmacist, [facility] [place]: [phone]

## 2019-09-03 ENCOUNTER — Other Ambulatory Visit: Payer: Self-pay | Admitting: Pharmacy Technician

## 2019-09-03 ENCOUNTER — Ambulatory Visit (INDEPENDENT_AMBULATORY_CARE_PROVIDER_SITE_OTHER): Payer: Medicare HMO | Admitting: Pharmacist

## 2019-09-03 ENCOUNTER — Other Ambulatory Visit: Payer: Self-pay

## 2019-09-03 ENCOUNTER — Ambulatory Visit (INDEPENDENT_AMBULATORY_CARE_PROVIDER_SITE_OTHER): Payer: Medicare HMO | Admitting: Internal Medicine

## 2019-09-03 ENCOUNTER — Encounter: Payer: Self-pay | Admitting: Internal Medicine

## 2019-09-03 VITALS — BP 114/68 | HR 95 | Temp 97.8°F | Ht 68.5 in | Wt 199.8 lb

## 2019-09-03 DIAGNOSIS — E663 Overweight: Secondary | ICD-10-CM

## 2019-09-03 DIAGNOSIS — R202 Paresthesia of skin: Secondary | ICD-10-CM

## 2019-09-03 DIAGNOSIS — I890 Lymphedema, not elsewhere classified: Secondary | ICD-10-CM | POA: Diagnosis not present

## 2019-09-03 DIAGNOSIS — E119 Type 2 diabetes mellitus without complications: Secondary | ICD-10-CM | POA: Diagnosis not present

## 2019-09-03 DIAGNOSIS — E7800 Pure hypercholesterolemia, unspecified: Secondary | ICD-10-CM

## 2019-09-03 DIAGNOSIS — I10 Essential (primary) hypertension: Secondary | ICD-10-CM | POA: Diagnosis not present

## 2019-09-03 NOTE — Patient Outreach (Signed)
[facility]) Care Management  09/03/2019  Terrius Gentile 01/16/1951 [id number]   Received provider and patient portion(s) of patient assistance application(s) for Janumet. Prepared to mail completed application and required documents into [employer].  Will follow up with company(ies) in 10-14 business days to check status of application(s).  Maud Deed Chana Bode [place] Certified Pharmacy Technician [place] Management Direct Dial:[phone]

## 2019-09-03 NOTE — Patient Instructions (Signed)
Visit Information  Goals Addressed            This Visit's Progress     Patient Stated   . I would like to apply for medication assistance for my diabetes medication (pt-stated)       Current Barriers:  . Financial Barriers: patient has [employer] and reports copay for Janumet is cost prohibitive at this time   Pharmacist Clinical Goal(s):  Marland Kitchen Over the next 30 days, patient will work with PharmD and providers to relieve medication access concerns  Interventions: . Comprehensive medication review completed; medication list updated in electronic medical record.  Carlyn Reichert by [employer]: Patient meets income/NO out of pocket spend criteria for this medication's patient assistance program. Reviewed application process. Patient will provide proof of income, out of pocket spend report, and will sign application. Will collaborate with primary care provider, Dr. Glendale Chard, for their portion of application. Once completed, will submit to [employer] patient assistance program. . ASSISTED PATIENT WITH APPLICATION PROCESS AT PCP OFFICE VISIT ON 09/03/19.  WILL CONTINUE TO FOLLOW  Patient Self Care Activities:  . Patient will provide necessary portions of application   Please see past updates related to this goal by clicking on the "Past Updates" button in the selected goal      . I would like to optimize medication management of my chronic conditions. (pt-stated)       Current Barriers:  . Diabetes: T2DM; most recent A1c 6.2% on 05/02/19 . Current antihyperglycemic regimen: Janumet 50-1000mg  (patient assistance program through [employer], however patient could likely stay on monotherapy with metformin if patient assistance does not get approved given A1c of 6.2%-- metformin monotherapy as tolerated) . denies hypoglycemic symptoms; denies hyperglycemic symptoms . Current meal patterns: avoids, desserts/sugary drinks. Counseled pt on balanced diet (low carb, protein, veggie) o Current  exercise: n/a . Current blood glucose readings:FBG<130, 90-112 per patient report on 12/21 . Cardiovascular risk reduction: o Current hypertensive regimen: prescribed amlodipine, lisinopril, HCTZ (no fill history) BP 114/68--may be getting medications on cash; reports compliance o Current hyperlipidemia regimen: prescribed atorvastatin (no fill history) o Call planned in 3 weeks to f/u on medication adherence  Pharmacist Clinical Goal(s):  Marland Kitchen Over the next 90 days, patient with work with PharmD and primary care provider to address needs related to optimized medication management of chronic conditions.  Interventions: . Comprehensive medication review performed, medication list updated in electronic medical record . Marland KitchenCounseled pt on medication purpose/side effects.  Encouraged compliance  Patient Self Care Activities:  . Patient will check blood glucose 3 times weekly (ultimately daily), document, and provide at future appointments . Patient will focus on medication adherence by continuing to take medications as prescribed; apply for patient assistance program through [employer] in order to continue Janumet due to financial barriers. . Patient will take medications as prescribed . Patient will contact provider with any episodes of hypoglycemia . Patient will report any questions or concerns to provider   Please see past updates related to this goal by clicking on the "Past Updates" button in the selected goal         The patient verbalized understanding of instructions provided today and declined a print copy of patient instruction materials.   The care management team will reach out to the patient again over the next 21 days.   SIGNATURE Marcus Davis, PharmD, BCPS Clinical Pharmacist, [facility] [place]: [phone]

## 2019-09-03 NOTE — Progress Notes (Signed)
Chronic Care Management    Visit Note  09/03/2019 Name: Marcus Davis MRN: [id number] DOB: [birth year]  Referred by: Glendale Chard, MD Reason for referral : chronic care managment   Marcus Davis is a 68 y.o. year old male who is a primary care patient of Glendale Chard, MD. The CCM team was consulted for assistance with chronic disease management and care coordination needs related to DMII  Review of patient status, including review of consultants reports, relevant laboratory and other test results, and collaboration with appropriate care team members and the patient's provider was performed as part of comprehensive patient evaluation and provision of chronic care management services.    I met with Ms. Lasseigne in clinic today.  Medications: Outpatient Encounter Medications as of 09/03/2019  Medication Sig  . amLODipine (NORVASC) 10 MG tablet Take by mouth.  Marland Kitchen aspirin EC 81 MG tablet Take 81 mg by mouth daily.  Marland Kitchen atorvastatin (LIPITOR) 40 MG tablet Take 40 mg by mouth daily. 1/2 tablet daily  . hydrochlorothiazide (HYDRODIURIL) 25 MG tablet Take by mouth.  Marland Kitchen lisinopril (PRINIVIL,ZESTRIL) 40 MG tablet Take by mouth.  . sitaGLIPtin-metformin (JANUMET) 50-1000 MG tablet Take 1 tablet by mouth 2 (two) times daily with a meal.  . Vitamin D, Cholecalciferol, 25 MCG (1000 UT) CAPS Take by mouth.   No facility-administered encounter medications on file as of 09/03/2019.     Objective:   Goals Addressed            This Visit's Progress     Patient Stated   . I would like to apply for medication assistance for my diabetes medication (pt-stated)       Current Barriers:  . Financial Barriers: patient has [employer] and reports copay for Janumet is cost prohibitive at this time   Pharmacist Clinical Goal(s):  Marland Kitchen Over the next 30 days, patient will work with PharmD and providers to relieve medication access concerns  Interventions: . Comprehensive medication review  completed; medication list updated in electronic medical record.  Carlyn Reichert by [employer]: Patient meets income/NO out of pocket spend criteria for this medication's patient assistance program. Reviewed application process. Patient will provide proof of income, out of pocket spend report, and will sign application. Will collaborate with primary care provider, Dr. Glendale Chard, for their portion of application. Once completed, will submit to [employer] patient assistance program. . ASSISTED PATIENT WITH APPLICATION PROCESS AT PCP OFFICE VISIT ON 09/03/19.  WILL CONTINUE TO FOLLOW  Patient Self Care Activities:  . Patient will provide necessary portions of application   Please see past updates related to this goal by clicking on the "Past Updates" button in the selected goal      . I would like to optimize medication management of my chronic conditions. (pt-stated)       Current Barriers:  . Diabetes: T2DM; most recent A1c 6.2% on 05/02/19 . Current antihyperglycemic regimen: Janumet 50-1038m (patient assistance program through [employer] however patient could likely stay on monotherapy with metformin if patient assistance does not get approved given A1c of 6.2%-- metformin monotherapy as tolerated) . denies hypoglycemic symptoms; denies hyperglycemic symptoms . Current meal patterns: avoids, desserts/sugary drinks. Counseled pt on balanced diet (low carb, protein, veggie) o Current exercise: n/a . Current blood glucose readings:FBG<130, 90-112 per patient report on 12/21 . Cardiovascular risk reduction: o Current hypertensive regimen: prescribed amlodipine, lisinopril, HCTZ (no fill history) BP 114/68--may be getting medications on cash; reports compliance o Current hyperlipidemia regimen: prescribed atorvastatin (no fill  history) o Call planned in 3 weeks to f/u on medication adherence  Pharmacist Clinical Goal(s):  Marland Kitchen Over the next 90 days, patient with work with PharmD and primary care provider to  address needs related to optimized medication management of chronic conditions.  Interventions: . Comprehensive medication review performed, medication list updated in electronic medical record . Marland KitchenCounseled pt on medication purpose/side effects.  Encouraged compliance  Patient Self Care Activities:  . Patient will check blood glucose 3 times weekly (ultimately daily), document, and provide at future appointments . Patient will focus on medication adherence by continuing to take medications as prescribed; apply for patient assistance program through [employer] in order to continue Janumet due to financial barriers. . Patient will take medications as prescribed . Patient will contact provider with any episodes of hypoglycemia . Patient will report any questions or concerns to provider   Please see past updates related to this goal by clicking on the "Past Updates" button in the selected goal          Plan:   The care management team will reach out to the patient again over the next 21 days.   Provider Signature Regina Eck, PharmD, BCPS Clinical Pharmacist, [facility] [place]: [phone]

## 2019-09-04 ENCOUNTER — Telehealth: Payer: Self-pay

## 2019-09-04 LAB — CMP14+EGFR
ALT: 17 IU/L (ref 0–44)
AST: 23 IU/L (ref 0–40)
Albumin/Globulin Ratio: 1.5 (ref 1.2–2.2)
Albumin: 4.1 g/dL (ref 3.8–4.8)
Alkaline Phosphatase: 67 IU/L (ref 39–117)
BUN/Creatinine Ratio: 9 — ABNORMAL LOW (ref 10–24)
BUN: 8 mg/dL (ref 8–27)
Bilirubin Total: 0.4 mg/dL (ref 0.0–1.2)
CO2: 24 mmol/L (ref 20–29)
Calcium: 9.9 mg/dL (ref 8.6–10.2)
Chloride: 101 mmol/L (ref 96–106)
Creatinine, Ser: 0.94 mg/dL (ref 0.76–1.27)
GFR calc Af Amer: 97 mL/min/{1.73_m2} (ref >59)
GFR calc non Af Amer: 84 mL/min/{1.73_m2} (ref >59)
Globulin, Total: 2.7 g/dL (ref 1.5–4.5)
Glucose: 102 mg/dL — ABNORMAL HIGH (ref 65–99)
Potassium: 4.4 mmol/L (ref 3.5–5.2)
Sodium: 140 mmol/L (ref 134–144)
Total Protein: 6.8 g/dL (ref 6.0–8.5)

## 2019-09-04 LAB — HEMOGLOBIN A1C
Est. average glucose Bld gHb Est-mCnc: 128 mg/dL
Hgb A1c MFr Bld: 6.1 % — ABNORMAL HIGH (ref 4.8–5.6)

## 2019-09-04 LAB — TSH: TSH: 0.803 u[IU]/mL (ref 0.450–4.500)

## 2019-09-04 LAB — VITAMIN B12: Vitamin B-12: >2000 pg/mL — ABNORMAL HIGH (ref 232–1245)

## 2019-09-04 NOTE — Telephone Encounter (Signed)
Left the patient a message to call back for lab results. 

## 2019-09-04 NOTE — Telephone Encounter (Signed)
-----   Message from Glendale Chard, MD sent at 09/04/2019  3:41 PM EST ----- Your liver and kidney fxn are stable. Your hba1c is great at 6.1. your vitamin B12 levels are elevated. I suggest not taking your supplement on the weekends.

## 2019-09-09 NOTE — Patient Instructions (Signed)

## 2019-09-09 NOTE — Progress Notes (Signed)
This visit occurred during the SARS-CoV-2 public health emergency.  Safety protocols were in place, including screening questions prior to the visit, additional usage of staff PPE, and extensive cleaning of exam room while observing appropriate contact time as indicated for disinfecting solutions.  Subjective:     Patient ID: Marcus Davis , male    DOB: Feb 02, 1951 , 68 y.o.   MRN: [id number]   Chief Complaint  Patient presents with  . Diabetes  . Hyperlipidemia    HPI  Diabetes He presents for his follow-up diabetic visit. He has type 2 diabetes mellitus. There are no hypoglycemic associated symptoms. There are no diabetic associated symptoms. Pertinent negatives for diabetes include no chest pain. There are no hypoglycemic complications. Risk factors for coronary artery disease include diabetes mellitus, dyslipidemia, hypertension, male sex and sedentary lifestyle. He is compliant with treatment most of the time. He is following a diabetic diet. He participates in exercise intermittently. Eye exam is current.  Hyperlipidemia This is a chronic problem. The current episode started more than 1 year ago. The problem is controlled. Exacerbating diseases include diabetes. Pertinent negatives include no chest pain, focal weakness or shortness of breath. Current antihyperlipidemic treatment includes statins.     Past Medical History:  Diagnosis Date  . Allergy   . DM (diabetes mellitus) ([place])   . HTN (hypertension)   . Hyperlipidemia      Family History  Problem Relation Age of Onset  . Diabetes Mother   . Hypertension Mother   . Stroke Mother   . Hypertension Father   . Diabetes Father   . Stroke Paternal Grandfather      Current Outpatient Medications:  .  amLODipine (NORVASC) 10 MG tablet, Take by mouth., Disp: , Rfl:  .  aspirin EC 81 MG tablet, Take 81 mg by mouth daily., Disp: , Rfl:  .  atorvastatin (LIPITOR) 40 MG tablet, Take 40 mg by mouth daily. 1/2 tablet daily, Disp: ,  Rfl:  .  hydrochlorothiazide (HYDRODIURIL) 25 MG tablet, Take by mouth., Disp: , Rfl:  .  lisinopril (PRINIVIL,ZESTRIL) 40 MG tablet, Take by mouth., Disp: , Rfl:  .  sitaGLIPtin-metformin (JANUMET) 50-1000 MG tablet, Take 1 tablet by mouth 2 (two) times daily with a meal., Disp: , Rfl:  .  Vitamin D, Cholecalciferol, 25 MCG (1000 UT) CAPS, Take by mouth., Disp: , Rfl:    Allergies  Allergen Reactions  . Pollen Extract Other (See Comments)    Sneezing, runny nose, congestion,      Review of Systems  Constitutional: Negative.   Respiratory: Negative.  Negative for shortness of breath.   Cardiovascular: Negative.  Negative for chest pain.  Gastrointestinal: Negative.   Neurological: Negative.  Negative for focal weakness.  Psychiatric/Behavioral: Negative.      Today's Vitals   09/03/19 1127  BP: 114/68  Pulse: 95  Temp: 97.8 F (36.6 C)  TempSrc: Oral  Weight: 199 lb 12.8 oz (90.6 kg)  Height: 5' 8.5" (1.74 m)  PainSc: 0-No pain   Body mass index is 29.94 kg/m.   Objective:  Physical Exam Vitals and nursing note reviewed.  Constitutional:      Appearance: Normal appearance.  Cardiovascular:     Rate and Rhythm: Normal rate and regular rhythm.     Heart sounds: Normal heart sounds.  Pulmonary:     Effort: Pulmonary effort is normal.     Breath sounds: Normal breath sounds.  Musculoskeletal:     Right lower leg: Edema present.  Skin:    General: Skin is warm.  Neurological:     General: No focal deficit present.     Mental Status: He is alert.  Psychiatric:        Mood and Affect: Mood normal.         Assessment And Plan:     1. Type 2 diabetes mellitus without complication, without long-term current use of insulin ([place])  I will check labs as listed below. Importance of regular exercise was discussed with the patient. He is encouraged to aim for 30 minutes five days weekly. He will rto in 3-4 months for re-evaluation.   - CMP14+EGFR - Hemoglobin  A1c  2. Pure hypercholesterolemia  Chronic. Importance of statin compliance was discussed with the patient.   3. Lymphedema  I will refer him to lymphedema clinic for PT. He is also encouraged to follow an anti-inflammatory diet.   - Ambulatory referral to Physical Therapy  4. Paresthesia of both lower extremities  His sx are suggestive of diabetic neuropathy. However, I will check labs as listed below. I will make further recommendations once his labs are available for review.   - Vitamin B12 - TSH  5. Overweight (BMI 25.0-29.9)  BMI 29. He is encouraged to try to lose 10-15 pounds to decrease cardiac risk.      [name], MD    THE PATIENT IS ENCOURAGED TO PRACTICE SOCIAL DISTANCING DUE TO THE COVID-19 PANDEMIC.

## 2019-09-11 ENCOUNTER — Telehealth: Payer: Self-pay

## 2019-09-11 NOTE — Telephone Encounter (Signed)
The patient was notified of his most recent lab results.

## 2019-09-27 DIAGNOSIS — I890 Lymphedema, not elsewhere classified: Secondary | ICD-10-CM | POA: Diagnosis not present

## 2019-10-01 ENCOUNTER — Telehealth: Payer: Self-pay

## 2019-10-11 ENCOUNTER — Telehealth: Payer: Self-pay

## 2019-10-11 NOTE — Telephone Encounter (Signed)
The pt called and wanted to check to on his referral to the neurologist for the numbness in his feet.  I told the pt that I would check on it and call him back.

## 2019-10-12 ENCOUNTER — Other Ambulatory Visit: Payer: Self-pay | Admitting: Internal Medicine

## 2019-10-12 ENCOUNTER — Telehealth: Payer: Self-pay

## 2019-10-12 MED ORDER — PREGABALIN 50 MG PO CAPS: ORAL_CAPSULE | ORAL | 1 refills | Status: DC

## 2019-10-12 NOTE — Telephone Encounter (Signed)
-----   Message from [name], MD sent at 10/12/2019  9:13 AM EST ----- Rx sent, he needs f/u appt in four weeks. (only schedule if he is not already on the books) ----- Message ----- From: Mariam Dollar, CMA Sent: 10/12/2019   8:13 AM EST To: [name], MD  The pt said yes he would like to try the Lyrica. ----- Message ----- From: [name], MD Sent: 10/11/2019   5:06 PM EST To: Mariam Dollar, CMA  Does he want to try Lyrica?  ----- Message ----- From: Mariam Dollar, CMA Sent: 10/11/2019   4:52 PM EST To: [name], MD  [facility] and it didn't help much. ----- Message ----- From: [name], MD Sent: 10/11/2019   4:31 PM EST To: Mariam Dollar, CMA  I did not want to refer him to neurology. Is that what he wants? We can try rx gabapentin 100mg  nightly to help with his symptoms.  ----- Message ----- From: , CMA Sent: 10/11/2019   4:14 PM EST To: 10/13/2019, MD  The pt was checking a referral to neurology about numbness in his feet.  You wanted to wait until after his lab results were in. Is he getting the referral? If he is I will place it.

## 2019-10-12 NOTE — Telephone Encounter (Signed)
The pt was notified and f/u appt was scheduled.

## 2019-10-22 ENCOUNTER — Ambulatory Visit: Payer: Self-pay | Admitting: Pharmacist

## 2019-10-22 DIAGNOSIS — E119 Type 2 diabetes mellitus without complications: Secondary | ICD-10-CM

## 2019-10-23 ENCOUNTER — Other Ambulatory Visit: Payer: Self-pay | Admitting: Pharmacy Technician

## 2019-10-23 NOTE — Patient Outreach (Signed)
[facility]) Care Management  10/23/2019  Mats Jeanlouis 05-25-1951 [id number]    Follow up call placed to [employer] regarding patient assistance application(s) for Janumet , Merlene Laughter confirms application has been received. Attestation form mailed out to patient on 08/20/20 for completion.  Will route note to [facility] Embedded RPh Kandra Nicolas to inform  Suzan Slick. Effie Shy CPhT Certified Pharmacy Technician [facility] Care Management Direct Dial:[phone]

## 2019-10-24 NOTE — Progress Notes (Signed)
  Chronic Care Management   Outreach Note  10/22/2019 Name: Marcus Davis MRN: [id number] DOB: [birth year]  Referred by: [name], MD Reason for referral : Chronic Care Management   An unsuccessful telephone outreach was attempted today. The patient was referred to the case management team for assistance with care management and care coordination.  Message left with patient explaining [employer] Patient Assistance letter of attestation.  Encouraged patient to call with questions.  Will take another month for patient to received medication in the mail.  Follow Up Plan: A HIPPA compliant phone message was left for the patient providing contact information and requesting a return call.  The care management team will reach out to the patient again over the next 10-15 days.   SIGNATURE Kieth Brightly, PharmD, BCPS Clinical Pharmacist, [facility]  [facility]  Direct Dial: [phone]

## 2019-11-07 ENCOUNTER — Ambulatory Visit: Payer: Self-pay

## 2019-11-07 NOTE — Progress Notes (Signed)
  Chronic Care Management   Outreach Note  11/07/2019 Name: Hartman Minahan MRN: [id number] DOB: 1951-07-18  Referred by: [name], MD Reason for referral : Chronic Care Management   An unsuccessful telephone outreach was attempted today. The patient was referred to the case management team for assistance with care management and care coordination.    SIGNATURE Kieth Brightly, PharmD, BCPS Clinical Pharmacist, [facility]  [facility]  Direct Dial: [phone]

## 2019-11-09 ENCOUNTER — Other Ambulatory Visit: Payer: Self-pay

## 2019-11-09 MED ORDER — PREGABALIN 50 MG PO CAPS: ORAL_CAPSULE | ORAL | 1 refills | Status: DC

## 2019-11-13 ENCOUNTER — Other Ambulatory Visit: Payer: Self-pay

## 2019-11-13 ENCOUNTER — Other Ambulatory Visit: Payer: Self-pay | Admitting: Internal Medicine

## 2019-11-13 MED ORDER — PREGABALIN 50 MG PO CAPS: ORAL_CAPSULE | ORAL | 1 refills | Status: DC

## 2019-11-27 ENCOUNTER — Encounter: Payer: Self-pay | Admitting: Internal Medicine

## 2019-11-27 ENCOUNTER — Ambulatory Visit (INDEPENDENT_AMBULATORY_CARE_PROVIDER_SITE_OTHER): Payer: Medicare HMO | Admitting: Internal Medicine

## 2019-11-27 ENCOUNTER — Other Ambulatory Visit: Payer: Self-pay

## 2019-11-27 VITALS — BP 116/74 | HR 94 | Temp 98.5°F | Ht 68.5 in | Wt 210.0 lb

## 2019-11-27 DIAGNOSIS — Z6831 Body mass index (BMI) 31.0-31.9, adult: Secondary | ICD-10-CM | POA: Diagnosis not present

## 2019-11-27 DIAGNOSIS — E6609 Other obesity due to excess calories: Secondary | ICD-10-CM | POA: Diagnosis not present

## 2019-11-27 DIAGNOSIS — E1141 Type 2 diabetes mellitus with diabetic mononeuropathy: Secondary | ICD-10-CM | POA: Diagnosis not present

## 2019-11-27 DIAGNOSIS — I890 Lymphedema, not elsewhere classified: Secondary | ICD-10-CM

## 2019-11-27 DIAGNOSIS — I10 Essential (primary) hypertension: Secondary | ICD-10-CM

## 2019-11-27 MED ORDER — PREGABALIN 75 MG PO CAPS: 75.0000 mg | ORAL_CAPSULE | Freq: Two times a day (BID) | ORAL | 1 refills | Status: DC

## 2019-11-27 NOTE — Patient Instructions (Addendum)
Exercising to Stay Healthy To become healthy and stay healthy, it is recommended that you do moderate-intensity and vigorous-intensity exercise. You can tell that you are exercising at a moderate intensity if your heart starts beating faster and you start breathing faster but can still hold a conversation. You can tell that you are exercising at a vigorous intensity if you are breathing much harder and faster and cannot hold a conversation while exercising. Exercising regularly is important. It has many health benefits, such as:  Improving overall fitness, flexibility, and endurance.  Increasing bone density.  Helping with weight control.  Decreasing body fat.  Increasing muscle strength.  Reducing stress and tension.  Improving overall health. How often should I exercise? Choose an activity that you enjoy, and set realistic goals. Your health care provider can help you make an activity plan that works for you. Exercise regularly as told by your health care provider. This may include:  Doing strength training two times a week, such as: ? Lifting weights. ? Using resistance bands. ? Push-ups. ? Sit-ups. ? Yoga.  Doing a certain intensity of exercise for a given amount of time. Choose from these options: ? A total of 150 minutes of moderate-intensity exercise every week. ? A total of 75 minutes of vigorous-intensity exercise every week. ? A mix of moderate-intensity and vigorous-intensity exercise every week. Children, pregnant women, people who have not exercised regularly, people who are overweight, and older adults may need to talk with a health care provider about what activities are safe to do. If you have a medical condition, be sure to talk with your health care provider before you start a new exercise program. What are some exercise ideas? Moderate-intensity exercise ideas include:  Walking 1 mile (1.6 km) in about 15  minutes.  Biking.  Hiking.  Golfing.  Dancing.  Water aerobics. Vigorous-intensity exercise ideas include:  Walking 4.5 miles (7.2 km) or more in about 1 hour.  Jogging or running 5 miles (8 km) in about 1 hour.  Biking 10 miles (16.1 km) or more in about 1 hour.  Lap swimming.  Roller-skating or in-line skating.  Cross-country skiing.  Vigorous competitive sports, such as football, basketball, and soccer.  Jumping rope.  Aerobic dancing. What are some everyday activities that can help me to get exercise?  Yard work, such as: ? Pushing a lawn mower. ? Raking and bagging leaves.  Washing your car.  Pushing a stroller.  Shoveling snow.  Gardening.  Washing windows or floors. How can I be more active in my day-to-day activities?  Use stairs instead of an elevator.  Take a walk during your lunch break.  If you drive, park your car farther away from your work or school.  If you take public transportation, get off one stop early and walk the rest of the way.  Stand up or walk around during all of your indoor phone calls.  Get up, stretch, and walk around every 30 minutes throughout the day.  Enjoy exercise with a friend. Support to continue exercising will help you keep a regular routine of activity. What guidelines can I follow while exercising?  Before you start a new exercise program, talk with your health care provider.  Do not exercise so much that you hurt yourself, feel dizzy, or get very short of breath.  Wear comfortable clothes and wear shoes with good support.  Drink plenty of water while you exercise to prevent dehydration or heat stroke.  Work out until your breathing   and your heartbeat get faster. Where to find more information  U.S. Department of Health and Human Services: ThisPath.fi  Centers for Disease Control and Prevention (CDC): [employer]Ex[employer].com.br Summary  Exercising regularly is important. It will improve your overall fitness,  flexibility, and endurance.  Regular exercise also will improve your overall health. It can help you control your weight, reduce stress, and improve your bone density.  Do not exercise so much that you hurt yourself, feel dizzy, or get very short of breath.  Before you start a new exercise program, talk with your health care provider. This information is not intended to replace advice given to you by your health care provider. Make sure you discuss any questions you have with your health care provider. Document Revised: 08/12/2017 Document Reviewed: 07/21/2017 Elsevier Patient Education  2020 [employer]. 0

## 2019-11-27 NOTE — Progress Notes (Signed)
This visit occurred during the SARS-CoV-2 public health emergency.  Safety protocols were in place, including screening questions prior to the visit, additional usage of staff PPE, and extensive cleaning of exam room while observing appropriate contact time as indicated for disinfecting solutions.  Subjective:     Patient ID: Marcus Davis , male    DOB: 03-06-1951 , 69 y.o.   MRN: [id number]   Chief Complaint  Patient presents with  . Diabetes  . Hypertension    HPI  He is here today for f/u DM/HTN. He reports compliance with meds. He states Lyrica 36m twice daily has grealty improved his   Diabetes He presents for his follow-up diabetic visit. He has type 2 diabetes mellitus. There are no hypoglycemic associated symptoms. There are no diabetic associated symptoms. Pertinent negatives for diabetes include no blurred vision and no chest pain. There are no hypoglycemic complications. Risk factors for coronary artery disease include diabetes mellitus, dyslipidemia, hypertension, male sex and sedentary lifestyle. He is compliant with treatment most of the time. He is following a diabetic diet. He participates in exercise intermittently. Eye exam is current.  Hypertension This is a chronic problem. The current episode started more than 1 year ago. The problem has been gradually improving since onset. The problem is controlled. Pertinent negatives include no blurred vision, chest pain or palpitations. The current treatment provides moderate improvement.     Past Medical History:  Diagnosis Date  . Allergy   . DM (diabetes mellitus) ([place]   . HTN (hypertension)   . Hyperlipidemia      Family History  Problem Relation Age of Onset  . Diabetes Mother   . Hypertension Mother   . Stroke Mother   . Hypertension Father   . Diabetes Father   . Stroke Paternal Grandfather      Current Outpatient Medications:  .  amLODipine (NORVASC) 10 MG tablet, Take by mouth., Disp: , Rfl:  .  aspirin  EC 81 MG tablet, Take 81 mg by mouth daily., Disp: , Rfl:  .  atorvastatin (LIPITOR) 40 MG tablet, Take 40 mg by mouth daily. 1/2 tablet daily, Disp: , Rfl:  .  hydrochlorothiazide (HYDRODIURIL) 25 MG tablet, Take by mouth., Disp: , Rfl:  .  lisinopril (PRINIVIL,ZESTRIL) 40 MG tablet, Take by mouth., Disp: , Rfl:  .  pregabalin (LYRICA) 50 MG capsule, One capsule po twice daily, Disp: 180 capsule, Rfl: 1 .  sitaGLIPtin-metformin (JANUMET) 50-1000 MG tablet, Take 1 tablet by mouth 2 (two) times daily with a meal., Disp: , Rfl:  .  Vitamin D, Cholecalciferol, 25 MCG (1000 UT) CAPS, Take by mouth., Disp: , Rfl:    Allergies  Allergen Reactions  . Pollen Extract Other (See Comments)    Sneezing, runny nose, congestion,      Review of Systems  Constitutional: Negative.   Eyes: Negative for blurred vision.  Respiratory: Negative.   Cardiovascular: Negative.  Negative for chest pain and palpitations.  Gastrointestinal: Negative.   Neurological: Negative.   Psychiatric/Behavioral: Negative.      Today's Vitals   11/27/19 1144  BP: 116/74  Pulse: 94  Temp: 98.5 F (36.9 C)  TempSrc: Oral  Weight: 210 lb (95.3 kg)  Height: 5' 8.5" (1.74 m)  PainSc: 0-No pain   Body mass index is 31.47 kg/m.   Objective:  Physical Exam Vitals and nursing note reviewed.  Constitutional:      Appearance: Normal appearance.  Cardiovascular:     Rate and Rhythm: Normal rate  and regular rhythm.     Heart sounds: Normal heart sounds.  Pulmonary:     Effort: Pulmonary effort is normal.     Breath sounds: Normal breath sounds.  Musculoskeletal:     Comments: RLE with compression bandage  Skin:    General: Skin is warm.  Neurological:     General: No focal deficit present.     Mental Status: He is alert.  Psychiatric:        Mood and Affect: Mood normal.         Assessment And Plan:     1. Type 2 diabetes mellitus with diabetic mononeuropathy, without long-term current use of insulin  ([place])  His neuropathy symptoms have improved with Lyrica, but not resolved. I will increase his Lyrica to 25m twice daily. He will start this once he completes current rx. Review of the [place] CSRS was performed in accordance of the [place] to dispensing any controlled drugs. I will also check labs as listed below and make medication changes as needed.   - Lipid panel - Hemoglobin A1c  2. Essential hypertension  Chronic, well controlled. He will continue with current meds. He is encouraged to avoid adding salt to his foods.  I will check renal function today.    - BMP8+EGFR  3. Lymphedema  Chronic. Edema has lessened, appears to be improving.   4. Class 1 obesity due to excess calories with serious comorbidity and body mass index (BMI) of 31.0 to 31.9 in adult  He is encouraged to strive to lose 10-15 pounds to decrease cardiac risk. Importance of regular exercise was discussed with the patient.   RMaximino Greenland MD    THE PATIENT IS ENCOURAGED TO PRACTICE SOCIAL DISTANCING DUE TO THE COVID-19 PANDEMIC.

## 2019-11-28 LAB — LIPID PANEL
Chol/HDL Ratio: 3.5 ratio (ref 0.0–5.0)
Cholesterol, Total: 113 mg/dL (ref 100–199)
HDL: 32 mg/dL — ABNORMAL LOW (ref >39)
LDL Chol Calc (NIH): 42 mg/dL (ref 0–99)
Triglycerides: 250 mg/dL — ABNORMAL HIGH (ref 0–149)
VLDL Cholesterol Cal: 39 mg/dL (ref 5–40)

## 2019-11-28 LAB — BMP8+EGFR
BUN/Creatinine Ratio: 11 (ref 10–24)
BUN: 10 mg/dL (ref 8–27)
CO2: 21 mmol/L (ref 20–29)
Calcium: 9.5 mg/dL (ref 8.6–10.2)
Chloride: 102 mmol/L (ref 96–106)
Creatinine, Ser: 0.95 mg/dL (ref 0.76–1.27)
GFR calc Af Amer: 96 mL/min/{1.73_m2} (ref >59)
GFR calc non Af Amer: 83 mL/min/{1.73_m2} (ref >59)
Glucose: 124 mg/dL — ABNORMAL HIGH (ref 65–99)
Potassium: 4.2 mmol/L (ref 3.5–5.2)
Sodium: 139 mmol/L (ref 134–144)

## 2019-11-28 LAB — HEMOGLOBIN A1C
Est. average glucose Bld gHb Est-mCnc: 151 mg/dL
Hgb A1c MFr Bld: 6.9 % — ABNORMAL HIGH (ref 4.8–5.6)

## 2019-12-12 ENCOUNTER — Telehealth: Payer: Self-pay

## 2019-12-12 ENCOUNTER — Other Ambulatory Visit: Payer: Self-pay | Admitting: Internal Medicine

## 2019-12-12 NOTE — Telephone Encounter (Signed)
I

## 2019-12-12 NOTE — Telephone Encounter (Signed)
Pregabalin refill

## 2019-12-12 NOTE — Telephone Encounter (Signed)
I called the pt to let him know that his pharmacy is requesting a refill on pregabalin 50 mg and that Dr. Allyne Gee sent a refill of pregablin 75 mg to the pharmacy.

## 2020-01-16 ENCOUNTER — Telehealth: Payer: Self-pay | Admitting: Internal Medicine

## 2020-01-16 NOTE — Chronic Care Management (AMB) (Signed)
  Chronic Care Management   Note  01/16/2020 Name: Noal Abshier MRN: [id number] DOB: [birth year]  Camran Keady is a 69 y.o. year old male who is a primary care patient of [name], MD and is actively engaged with the care management team. I reached out to [name] by phone today to assist with scheduling an initial visit with the Pharmacist  Follow up plan: Unsuccessful telephone outreach attempt made. A HIPPA compliant phone message was left for the patient providing contact information and requesting a return call. The care management team will reach out to the patient again over the next 7 days.  If patient returns call to provider office, please advise to call Embedded Care Management Care Guide [name] at [phone]  [facility] [facility] Guide, Embedded Care Coordination [facility]  [place], [place] [address] Direct Dial: [phone] [email or web address] Website: [email or web address]

## 2020-01-21 NOTE — Chronic Care Management (AMB) (Signed)
  Care Management   Note  01/21/2020 Name: Marcus Davis MRN: [id number] DOB: [birth year]  Marcus Davis is a 69 y.o. year old male who is a primary care patient of [name], MD and is actively engaged with the care management team. I reached out to [name] by phone today to assist with scheduling an initial visit with the Pharmacist.  Follow up plan: A second unsuccessful telephone outreach attempt made. Not able to leave a HIPPA compliant phone message requesting a return call due to voicemail picking up. The care management team will reach out to the patient again over the next 7 days.  If patient returns call to provider office, please advise to call Embedded Care Management Care Guide [name] at [phone].  [name]  Care Guide, Embedded Care Coordination [facility]  [place], [place] [address] Direct Dial: [phone] [email or web address] Website: [email or web address]

## 2020-01-28 NOTE — Chronic Care Management (AMB) (Signed)
  Care Management   Note  01/28/2020 Name: Marcus Davis MRN: [id number] DOB: 09-25-1950  [name] is a 69 y.o. year old male who is a primary care patient of [name], MD and is actively engaged with the care management team. I reached out to [name] by phone today to assist with scheduling an initial visit with the Pharmacist  Follow up plan: Third unsuccessful telephone outreach attempt made. A HIPPA compliant phone message was left for the patient providing contact information and requesting a return call. If patient returns call to provider office, please advise to call Embedded Care Management Care Guide [name]  at [phone].  [name]  Care Guide, Embedded Care Coordination [facility]  [place], [place] [address] Direct Dial: [phone] [email or web address] Website: [email or web address]

## 2020-01-29 ENCOUNTER — Telehealth: Payer: Self-pay | Admitting: Internal Medicine

## 2020-01-29 NOTE — Chronic Care Management (AMB) (Signed)
  Chronic Care Management   Note  01/29/2020 Name: Sivan Quast MRN: [id number] DOB: 09-Nov-1950  Brycin Kille is a 69 y.o. year old male who is a primary care patient of Glendale Chard, MD. I reached out to Cheryle Horsfall by phone today in response to a referral sent by Mr. [employer]anny health plan.     Mr. Frangos was given information about Chronic Care Management services today including:  1. CCM service includes personalized support from designated clinical staff supervised by his physician, including individualized plan of care and coordination with other care providers 2. 24/7 contact phone numbers for assistance for urgent and routine care needs. 3. Service will only be billed when office clinical staff spend 20 minutes or more in a month to coordinate care. 4. Only one practitioner may furnish and bill the service in a calendar month. 5. The patient may stop CCM services at any time (effective at the end of the month) by phone call to the office staff. 6. The patient will be responsible for cost sharing (co-pay) of up to 20% of the service fee (after annual deductible is met).  Patient agreed to services and verbal consent obtained.   Follow up plan: Telephone appointment with care management team member scheduled for:02/21/2020.  [place], [place] [address] Direct Dial: [phone] [email or web address] Website: [email or web address]

## 2020-02-05 ENCOUNTER — Encounter: Payer: Medicare HMO | Admitting: Internal Medicine

## 2020-02-05 ENCOUNTER — Ambulatory Visit: Payer: Medicare HMO

## 2020-02-07 ENCOUNTER — Ambulatory Visit: Payer: Medicare HMO

## 2020-02-07 ENCOUNTER — Encounter: Payer: Medicare HMO | Admitting: Internal Medicine

## 2020-02-07 ENCOUNTER — Ambulatory Visit (INDEPENDENT_AMBULATORY_CARE_PROVIDER_SITE_OTHER): Payer: Medicare HMO

## 2020-02-07 DIAGNOSIS — E1141 Type 2 diabetes mellitus with diabetic mononeuropathy: Secondary | ICD-10-CM | POA: Diagnosis not present

## 2020-02-07 DIAGNOSIS — I10 Essential (primary) hypertension: Secondary | ICD-10-CM

## 2020-02-08 NOTE — Patient Instructions (Signed)
Social Worker Visit Information  Goals we discussed today:  Goals Addressed            This Visit's Progress     Patient Stated   . COMPLETED: I would like to apply for medication assistance for my diabetes medication (pt-stated)       Current Barriers:  . Financial Barriers: patient has [employer] and reports copay for Janumet is cost prohibitive at this time   Pharmacist Clinical Goal(s):  Marland Kitchen Over the next 30 days, patient will work with PharmD and providers to relieve medication access concerns Goal Met  CCM SW Interventions: Completed 02/07/20 . Successful outbound call placed to the patinet . Confirmed patient has received PAP for Janumet . Goal Met  Interventions: . Comprehensive medication review completed; medication list updated in electronic medical record.  Carlyn Reichert by [employer]: Patient meets income/NO out of pocket spend criteria for this medication's patient assistance program. Reviewed application process. Patient will provide proof of income, out of pocket spend report, and will sign application. Will collaborate with primary care provider, Dr. Glendale Chard, for their portion of application. Once completed, will submit to [employer] patient assistance program. . ASSISTED PATIENT WITH APPLICATION PROCESS AT PCP OFFICE VISIT ON 09/03/19.  WILL CONTINUE TO FOLLOW  Patient Self Care Activities:  . Patient will provide necessary portions of application   Please see past updates related to this goal by clicking on the "Past Updates" button in the selected goal        Other   . Provide education on how to complete an advance directive       CARE PLAN ENTRY (see longtitudinal plan of care for additional care plan information)  Current Barriers:  . Limited education about the importance of naming a healthcare power of attorney . Recent widow leaving patient without designated person to make health care decisions on his behalf . Chronic conditions including  HTN and DM II which put patient at increased risk for hospitalizations  Social Work Clinical Goal(s):  Marland Kitchen Over the next 20 days, the patient will review mailed Advance Directive packet . Over the next 30 days, patient will verbalize basic understanding of Advanced Directives and importance of completion  CCM SW Interventions: Completed 02/07/20 . Interviewed patient about Financial controller and provided education about the importance of completing advanced directives . Mailed the patient an Emergency planning/management officer . Advised patient to review information mailed by this SW . Scheduled follow up call over the next month to review packet, answer questions, and assist with completion if desired by the patient  Patient Self Care Activities:  . Is able to readily make contact with social support system . Can identify next of kin, power or attorney, guardian, or primary caregiver . Attends all scheduled provider appointments . Calls provider office for new concerns or questions . Performs ADLs independently  Initial goal documentation          Materials Provided: Yes: Provided Advance Directive packet  Follow Up Plan: SW will follow up with patient by phone over the next month.   Daneen Schick, BSW, CDP Social Worker, Certified Dementia Practitioner [place] [facility] [place] [facility] [phone]

## 2020-02-08 NOTE — Chronic Care Management (AMB) (Signed)
Chronic Care Management    Social Work Follow Up Note  02/07/2020 Name: Marcus Davis MRN: [id number] DOB: [birth year]  Marcus Davis is a 69 y.o. year old male who is a primary care patient of Marcus Chard, MD. The CCM team was consulted for assistance with care coordination.   Review of patient status, including review of consultants reports, other relevant assessments, and collaboration with appropriate care team members and the patient's provider was performed as part of comprehensive patient evaluation and provision of chronic care management services.    SDOH (Social Determinants of Health) assessments performed: Yes no acute needs.    Outpatient Encounter Medications as of 02/07/2020  Medication Sig  . amLODipine (NORVASC) 10 MG tablet Take by mouth.  Marland Kitchen aspirin EC 81 MG tablet Take 81 mg by mouth daily.  Marland Kitchen atorvastatin (LIPITOR) 40 MG tablet Take 40 mg by mouth daily. 1/2 tablet daily  . hydrochlorothiazide (HYDRODIURIL) 25 MG tablet Take by mouth.  Marland Kitchen lisinopril (PRINIVIL,ZESTRIL) 40 MG tablet Take by mouth.  . pregabalin (LYRICA) 50 MG capsule Take 1 capsule by mouth twice daily  . pregabalin (LYRICA) 75 MG capsule Take 1 capsule (75 mg total) by mouth 2 (two) times daily.  . sitaGLIPtin-metformin (JANUMET) 50-1000 MG tablet Take 1 tablet by mouth 2 (two) times daily with a meal.  . Vitamin D, Cholecalciferol, 25 MCG (1000 UT) CAPS Take by mouth.   No facility-administered encounter medications on file as of 02/07/2020.     Goals Addressed            This Visit's Progress     Patient Stated   . COMPLETED: I would like to apply for medication assistance for my diabetes medication (pt-stated)       Current Barriers:  . Financial Barriers: patient has [employer] and reports copay for Janumet is cost prohibitive at this time   Pharmacist Clinical Goal(s):  Marland Kitchen Over the next 30 days, patient will work with PharmD and providers to relieve medication access  concerns Goal Met  CCM SW Interventions: Completed 02/07/20 . Successful outbound call placed to the patinet . Confirmed patient has received PAP for Janumet . Goal Met  Interventions: . Comprehensive medication review completed; medication list updated in electronic medical record.  Marcus Davis by [employer]: Patient meets income/NO out of pocket spend criteria for this medication's patient assistance program. Reviewed application process. Patient will provide proof of income, out of pocket spend report, and will sign application. Will collaborate with primary care provider, Dr. Glendale Davis, for their portion of application. Once completed, will submit to [employer] patient assistance program. . ASSISTED PATIENT WITH APPLICATION PROCESS AT PCP OFFICE VISIT ON 09/03/19.  WILL CONTINUE TO FOLLOW  Patient Self Care Activities:  . Patient will provide necessary portions of application   Please see past updates related to this goal by clicking on the "Past Updates" button in the selected goal        Other   . Provide education on how to complete an advance directive       CARE PLAN ENTRY (see longtitudinal plan of care for additional care plan information)  Current Barriers:  . Limited education about the importance of naming a healthcare power of attorney . Recent widow leaving patient without designated person to make health care decisions on his behalf . Chronic conditions including HTN and DM II which put patient at increased risk for hospitalizations  Social Work Clinical Goal(s):  Marland Kitchen Over the next 20 days,  the patient will review mailed Advance Directive packet . Over the next 30 days, patient will verbalize basic understanding of Advanced Directives and importance of completion  CCM SW Interventions: Completed 02/07/20 . Interviewed patient about Financial controller and provided education about the importance of completing advanced directives . Mailed the patient an Sales promotion account executive . Advised patient to review information mailed by this SW . Scheduled follow up call over the next month to review packet, answer questions, and assist with completion if desired by the patient  Patient Self Care Activities:  . Is able to readily make contact with social support system . Can identify next of kin, power or attorney, guardian, or primary caregiver . Attends all scheduled provider appointments . Calls provider office for new concerns or questions . Performs ADLs independently  Initial goal documentation          Follow Up Plan: SW will follow up with patient by phone over the next month.   Marcus Davis, BSW, CDP Social Worker, Certified Dementia Practitioner [place] [facility] [place] [facility] [phone]  Total time spent performing care coordination and/or care management activities with the patient by phone or face to face = 30 minutes.

## 2020-02-18 ENCOUNTER — Other Ambulatory Visit: Payer: Self-pay

## 2020-02-18 ENCOUNTER — Ambulatory Visit: Payer: Self-pay

## 2020-02-18 ENCOUNTER — Telehealth: Payer: Self-pay

## 2020-02-18 DIAGNOSIS — E1141 Type 2 diabetes mellitus with diabetic mononeuropathy: Secondary | ICD-10-CM

## 2020-02-18 DIAGNOSIS — I10 Essential (primary) hypertension: Secondary | ICD-10-CM

## 2020-02-19 NOTE — Chronic Care Management (AMB) (Signed)
  Chronic Care Management   Outreach Note  02/19/2020 Name: Marcus Davis MRN: [id number] DOB: 09-07-1951  Referred by: [name], MD Reason for referral : Chronic Care Management (RQ Initial RN CM Call - see Stacey's message)   An unsuccessful telephone outreach was attempted today. The patient was referred to the case management team for assistance with care management and care coordination.   Follow Up Plan: Telephone follow up appointment with care management team member scheduled for: 03/18/20  Delsa Sale, RN, BSN, CCM Care Management Coordinator [facility] Care Management/[facility]  Direct Phone: [phone]

## 2020-02-21 ENCOUNTER — Ambulatory Visit: Payer: Medicare HMO

## 2020-02-21 ENCOUNTER — Other Ambulatory Visit: Payer: Self-pay

## 2020-02-21 DIAGNOSIS — E1141 Type 2 diabetes mellitus with diabetic mononeuropathy: Secondary | ICD-10-CM

## 2020-02-21 DIAGNOSIS — I10 Essential (primary) hypertension: Secondary | ICD-10-CM

## 2020-02-21 DIAGNOSIS — E7800 Pure hypercholesterolemia, unspecified: Secondary | ICD-10-CM

## 2020-02-21 NOTE — Chronic Care Management (AMB) (Signed)
Chronic Care Management Pharmacy  Name: Marcus Davis  MRN: [id number] DOB: 12-Oct-1950  Chief Complaint/ HPI  Marcus Davis,  69 y.o. , male presents for their Initial CCM visit with the clinical pharmacist via telephone due to COVID-19 Pandemic.  PCP : Glendale Chard, MD  Their chronic conditions include: Hypertension, Diabetes, Hypercholesterolemia  Office Visits: 11/27/19 OV: Presented for HTN and DM follow up. Pt reported Lyrica 85m twice daily had greatly improved his neuropathy symptoms. Increased Lyrica to 79mtwice daily since symptoms improved but not resolved. Labs ordered (Lipid panel, HgbA1c, BMP8+EGFR). Lymphedema appears to be improving. HTN controlled  10/12/19 Telephone call: Pt contacted office regarding neurology referral for numbness in feet. Instructed that no neurology referral was needed at this time. Gabapentin 10046mightly offered, but pt said he tried in the past with no improvement. Rx sent in for Lyrica. Pt instructed to follow up in 4 weeks.   09/03/19 OV: Presented for HTN and DM follow up. Labs ordered (CMP14+EGFR, HgbA1c, Vitamin B12, TSH). Referred to Physical Therapy for lymphedema. Noted symptoms suggestive of diabetic neuropathy. Exercise and weight loss recommendations provided. Pt instructed not to take B12 supplement on weekends due to elevated B12 levels.   Consult Visit: 09/27/19 PT OV w/ C. Dowtin: Pt reported insidious onset of right lower leg/foot edema. Elevation overnight helps, but swelling returns within 1 hour. 15-20 mmHg compression socks still allow swelling and dig into upper calf. Was not able to get on 20-30 mmHg compression socks recommended by PCP. Pt fitted for Circaid juxta-lite lower leg garments, PAC bands, and Farrow basic leg piece. Pt shown how to use ButLorettor putting on compression socks. Provided pt education.   CCM Encounters: 02/07/20 SW: Confirmed pt has received assistance for Janumet. Reviewed and provided education  regarding advanced directives.   09/03/19 PharmD: Assisted pt with PAP application at PCP OV today for Janumet (pt reported cost prohibitive on [employer]). Comprehensive medication review performed. Pt reports compliance, but no fill history available. Review compliance at follow up.   Medications: Outpatient Encounter Medications as of 02/21/2020  Medication Sig  . amLODipine (NORVASC) 10 MG tablet Take by mouth.  . aMarland Kitchenpirin EC 81 MG tablet Take 81 mg by mouth daily.  . aMarland Kitchenorvastatin (LIPITOR) 40 MG tablet Take 40 mg by mouth daily. 1/2 tablet daily  . hydrochlorothiazide (HYDRODIURIL) 25 MG tablet Take by mouth.  . lMarland Kitchensinopril (PRINIVIL,ZESTRIL) 40 MG tablet Take by mouth.  . pregabalin (LYRICA) 50 MG capsule Take 1 capsule by mouth twice daily  . pregabalin (LYRICA) 75 MG capsule Take 1 capsule (75 mg total) by mouth 2 (two) times daily.  . sitaGLIPtin-metformin (JANUMET) 50-1000 MG tablet Take 1 tablet by mouth 2 (two) times daily with a meal.  . Vitamin D, Cholecalciferol, 25 MCG (1000 UT) CAPS Take by mouth.   No facility-administered encounter medications on file as of 02/21/2020.    Current Diagnosis/Assessment:  SDOH Interventions     Most Recent Value  SDOH Interventions  Financial Strain Interventions Other (Comment)  [Contacted [employer] regarding patient assistance for Janumet]      Goals Addressed            This Visit's Progress   . Pharmacy Care Plan       CARE PLAN ENTRY (see longitudinal plan of care for additional care plan information)  Current Barriers:  . Chronic Disease Management support, education, and care coordination needs related to Hypertension, Hyperlipidemia, and Diabetes   Hypertension BP Readings from Last  3 Encounters:  11/27/19 116/74  09/03/19 114/68  07/17/19 132/80   . Pharmacist Clinical Goal(s): o Over the next 180 days, patient will work with PharmD and providers to maintain BP goal <130/80 . Current regimen:   Amlodipine 65m  daily  Hydrochlorothiazide 260mdaily  Lisinopril 4044maily . Interventions: o Provided dietary recommendations . Patient self care activities - Over the next 180 days, patient will: o Check BP when occasionally and if symptomatic, document, and provide at future appointments o Ensure daily salt intake < 2300 mg/day o Continue exercising at least 150 minutes weekly o Try to drink 64 ounces of water daily o Utilize PLATE method for meal planning (information mailed)  Hyperlipidemia Lab Results  Component Value Date/Time   LDLCALC 42 11/27/2019 02:54 PM   . Pharmacist Clinical Goal(s): o Over the next 180 days, patient will work with PharmD and providers to maintain LDL goal < 70 . Current regimen:  o Atorvastatin 57m42m2 tablet daily . Interventions: o Provided dietary recommendations o Counseled on the importance of healthy fats (avocados, walnuts, flaxseed, etc) o Counseled on the importance of reducing snacking on foods high in saturated fats . Patient self care activities - Over the next 180 days, patient will: o Continue cholesterol medication daily o Reduce snacking on unhealthy foods that are high in saturated fats o Eat more healthy fats  Diabetes Lab Results  Component Value Date/Time   HGBA1C 6.9 (H) 11/27/2019 02:54 PM   HGBA1C 6.1 (H) 09/03/2019 12:18 PM   . Pharmacist Clinical Goal(s): o Over the next 180 days, patient will work with PharmD and providers to maintain A1c goal <7% . Current regimen:  o Janumet 50/1000mg twice daily with a meal . Interventions: o Provided dietary recommendations o Counseled on the PLATE method for meal planning o Advised patient to limit sweet o Contacted [employer] patient assistance program to verify refills remaining on Janumet . Patient self care activities - Over the next 180 days, patient will: o Check blood sugar once daily, document, and provide at future appointments o Contact provider with any episodes of  hypoglycemia o Focus on eating a well-balanced diet, limiting carbohydrates o Reduce consumption of sweets and junk foods  Medication management . Pharmacist Clinical Goal(s): o Over the next 180 days, patient will work with PharmD and providers to maintain optimal medication adherence . [facility] o Comprehensive medication review performed. o Continue current medication management strategy . Patient self care activities - Over the next 180 days, patient will: o Focus on medication adherence by utilizing a system to remember to take medications o Take medications as prescribed o Report any questions or concerns to PharmD and/or provider(s)  Initial goal documentation        Diabetes   Recent Relevant Labs: Lab Results  Component Value Date/Time   HGBA1C 6.9 (H) 11/27/2019 02:54 PM   HGBA1C 6.1 (H) 09/03/2019 12:18 PM   MICROALBUR 80 01/30/2019 11:53 AM    Checking BG: Daily  Recent FBG Readings: 110, 126 Recent pre-meal BG readings:  Recent 2hr PP BG readings:   Recent HS BG readings:  Patient has failed these meds in past: N/A Patient is currently controlled on the following medications:   Janumet 50/1000mg twice daily with a meal  Aspirin 81mg23mly  Last diabetic Foot exam: 01/30/19 Last diabetic Eye exam: Lab Results  Component Value Date/Time   HMDIABEYEEXA Retinopathy (A) 05/24/2019 12:00 AM   We discussed:  Diet extensively  Snacks more than he should  Doesn't like fast food  Likes soup and sandwich from [employer]  Chicken, steak, fish, sauteed spinach, some salad   Drinks: Flavored seltzers (not sweetened), some bottled water  Recommend PLATE method for meal planning a well-balanced diet  Recommend limit sweets  Recommend increase water intake to 64oz daily  Exercise extensively  Pt walks 3 times weekly for about an hour each time at local parks [place]  Pt feels  that recent increase in BG/A1c has been due to stress from wife passing away  Pt receives Janumet from [employer] PAP.   Next shipment scheduled for delivery on 03/05/20 for 90 day supply per Caren Griffins at [facility]. 3 refills remaining on prescription. Pt has to call for refills.   Plan -Continue current medications   Hypertension   Office blood pressures are  BP Readings from Last 3 Encounters:  11/27/19 116/74  09/03/19 114/68  07/17/19 132/80   Patient has failed these meds in the past: N/A Patient is currently controlled on the following medications:   Amlodipine 48m daily  HCTZ 288mdaily  Lisinopril 4067maily  Patient checks BP at home infrequently  Patient home BP readings are ranging: None to report  We discussed:  Diet and exercise extensively  [facility] [place] [facility] and BP was 132/80  Check BP occasionally and if symptomatic  Denies nocturia with HCTZ  Goal BP <130/80  Plan -Continue current medications    Hyperlipidemia   Lipid Panel     Component Value Date/Time   CHOL 113 11/27/2019 1454   TRIG 250 (H) 11/27/2019 1454   HDL 32 (L) 11/27/2019 1454   LDLCALC 42 11/27/2019 1454    The ASCVD Risk score (Goff DC Jr., et al., 2013) failed to calculate for the following reasons:   The valid total cholesterol range is 130 to 320 mg/dL   Patient has failed these meds in past: N/A Patient is currently uncontrolled on the following medications:   Atorvastatin 54m36m2 tablet daily   We discussed:   Diet and exercise extensively  Reviewed and discussed recent lipid panel   Elevated triglycerides (>150)  Low HDL (<40)  Recommended pt limit snacking on junk food  Recommend increased intake of healthy fats (avocados, walnuts, flaxseed, etc)  Plan -Continue current medications  Neuropathy   Patient has failed these meds in past: Gabapentin Patient is currently controlled on the following medications:   Pregabalin 75mg44mce  daily  We discussed:    Pt reports neuropathy is better, has a lot more sensation in feet than before Lyrica  Plan -Continue current medications   Over the Counter Medications   Patient is currently on the following medications:   Cholecalciferol 1000 units daily  Plan -Continue current medications  Vaccines   Reviewed and discussed patient's vaccination history.   Immunization History  Administered Date(s) Administered  . Janssen (J&J) SARS-COV-2 Vaccination 12/17/2019   Pt states that he had a pneumonia shot 3-4 years ago. No records on [facility].  Plan  -Recommended patient receive Shingrix vaccine in pharmacy.   Medication Management   [facility] Uses pill box? No - Has medications in a drawer and has a routine. Feels pill box is too cumbersome Pt endorses 100% compliance  We discussed:   Importance of medication compliance  Plan Continue current medication management strategy   Follow up: 6 week phone visit  Jannette Fogo, PharmD Clinical Pharmacist [facility] [phone]

## 2020-03-03 ENCOUNTER — Telehealth: Payer: Self-pay

## 2020-03-03 NOTE — Telephone Encounter (Signed)
I left the pt message that his samples of Janumet is ready for pickup and that I would let Toni Amend the pharmacist know that he needed a call back about getting refills from the patient assistance program

## 2020-03-03 NOTE — Telephone Encounter (Signed)
The pt was told that his samples of Janumet is ready for pickup and that he needed an appt for a f/u.  The pt wanted to know if he can come at his scheduled appt next month and the pt was told yes and to make sure he keeps his appt next month.

## 2020-03-06 ENCOUNTER — Ambulatory Visit: Payer: Self-pay

## 2020-03-06 ENCOUNTER — Telehealth: Payer: Self-pay

## 2020-03-06 DIAGNOSIS — I10 Essential (primary) hypertension: Secondary | ICD-10-CM

## 2020-03-06 DIAGNOSIS — E1141 Type 2 diabetes mellitus with diabetic mononeuropathy: Secondary | ICD-10-CM

## 2020-03-06 NOTE — Chronic Care Management (AMB) (Signed)
  Chronic Care Management   Outreach Note  03/06/2020 Name: Marcus Davis MRN: [id number] DOB: 10-25-1950  Referred by: [name], MD Reason for referral : Care Coordination   SW placed an unsuccessful outbound call to the patient to assist with care coordination needs and assess patient goal progression. SW left a HIPAA compliant voice message requesting a return call.  Follow Up Plan: The care management team will reach out to the patient again over the next 14 days.   Bevelyn Ngo, BSW, CDP Social Worker, Certified Dementia Practitioner [facility] Care Management [phone]

## 2020-03-18 ENCOUNTER — Ambulatory Visit: Payer: Self-pay

## 2020-03-18 ENCOUNTER — Other Ambulatory Visit: Payer: Self-pay

## 2020-03-18 ENCOUNTER — Telehealth: Payer: Medicare HMO

## 2020-03-18 DIAGNOSIS — I10 Essential (primary) hypertension: Secondary | ICD-10-CM

## 2020-03-18 DIAGNOSIS — E1141 Type 2 diabetes mellitus with diabetic mononeuropathy: Secondary | ICD-10-CM

## 2020-03-19 ENCOUNTER — Telehealth: Payer: Self-pay

## 2020-03-19 ENCOUNTER — Ambulatory Visit: Payer: Self-pay

## 2020-03-19 DIAGNOSIS — E1141 Type 2 diabetes mellitus with diabetic mononeuropathy: Secondary | ICD-10-CM

## 2020-03-19 DIAGNOSIS — I10 Essential (primary) hypertension: Secondary | ICD-10-CM

## 2020-03-19 NOTE — Chronic Care Management (AMB) (Signed)
  Chronic Care Management   Outreach Note  03/19/2020 Name: Jaylun Fleener MRN: [id number] DOB: 1951/03/08  Referred by: [name], MD Reason for referral : Care Coordination   SW placed second unsuccessful outbound call to the patient to assess progression of patient stated goal. SW left a HIPAA compliant voice message requesting a return call.  Follow Up Plan: The care management team will reach out to the patient again over the next 14 days.   Bevelyn Ngo, BSW, CDP Social Worker, Certified Dementia Practitioner [facility] Care Management [phone]

## 2020-03-19 NOTE — Chronic Care Management (AMB) (Signed)
  Chronic Care Management   Outreach Note  03/19/2020 Name: Marcus Davis MRN: [id number] DOB: [birth year]  Referred by: [name], MD Reason for referral : Chronic Care Management (RQ #2 Initial RN CM Call - DM/HTN/HLD)   A second unsuccessful telephone outreach was attempted today. The patient was referred to the case management team for assistance with care management and care coordination.   Follow Up Plan: Telephone follow up appointment with care management team member scheduled for: 04/30/20  Delsa Sale, RN, BSN, CCM Care Management Coordinator [facility] Care Management/[facility]  Direct Phone: [phone]

## 2020-03-27 ENCOUNTER — Ambulatory Visit: Payer: Self-pay

## 2020-03-27 ENCOUNTER — Telehealth: Payer: Self-pay

## 2020-03-27 DIAGNOSIS — I10 Essential (primary) hypertension: Secondary | ICD-10-CM

## 2020-03-27 DIAGNOSIS — E1141 Type 2 diabetes mellitus with diabetic mononeuropathy: Secondary | ICD-10-CM

## 2020-03-27 NOTE — Chronic Care Management (AMB) (Signed)
°  Chronic Care Management   Outreach Note  03/27/2020 Name: Marcus Davis MRN: [id number] DOB: 1950-11-02  Referred by: [name], MD Reason for referral : Care Coordination   Third unsuccessful telephone outreach was attempted today. The patient was referred to the case management team for assistance with care management and care coordination. The patient's primary care provider has been notified of SW unsuccessful attempts to make or maintain contact with the patient. SW is pleased to engage with this patient at any time in the future should he be interested in assistance.  Follow Up Plan: No SW follow up planned at this time. The patient will remain active with RN Care Manager and embedded PharmD.  Bevelyn Ngo, BSW, CDP Social Worker, Certified Dementia Practitioner [facility] Care Management [phone]

## 2020-03-27 NOTE — Patient Instructions (Addendum)
Visit Information  Goals Addressed            This Visit's Progress   . Pharmacy Care Plan       CARE PLAN ENTRY (see longitudinal plan of care for additional care plan information)  Current Barriers:  . Chronic Disease Management support, education, and care coordination needs related to Hypertension, Hyperlipidemia, and Diabetes   Hypertension BP Readings from Last 3 Encounters:  11/27/19 116/74  09/03/19 114/68  07/17/19 132/80   . Pharmacist Clinical Goal(s): o Over the next 180 days, patient will work with PharmD and providers to maintain BP goal <130/80 . Current regimen:   Amlodipine 10mg  daily  Hydrochlorothiazide 25mg  daily  Lisinopril 40mg  daily . Interventions: o Provided dietary recommendations . Patient self care activities - Over the next 180 days, patient will: o Check BP when occasionally and if symptomatic, document, and provide at future appointments o Ensure daily salt intake < 2300 mg/day o Continue exercising at least 150 minutes weekly o Try to drink 64 ounces of water daily o Utilize PLATE method for meal planning (information mailed)  Hyperlipidemia Lab Results  Component Value Date/Time   LDLCALC 42 11/27/2019 02:54 PM   . Pharmacist Clinical Goal(s): o Over the next 180 days, patient will work with PharmD and providers to maintain LDL goal < 70 . Current regimen:  o Atorvastatin 40mg  1/2 tablet daily . Interventions: o Provided dietary recommendations o Counseled on the importance of healthy fats (avocados, walnuts, flaxseed, etc) o Counseled on the importance of reducing snacking on foods high in saturated fats . Patient self care activities - Over the next 180 days, patient will: o Continue cholesterol medication daily o Reduce snacking on unhealthy foods that are high in saturated fats o Eat more healthy fats  Diabetes Lab Results  Component Value Date/Time   HGBA1C 6.9 (H) 11/27/2019 02:54 PM   HGBA1C 6.1 (H) 09/03/2019 12:18  PM   . Pharmacist Clinical Goal(s): o Over the next 180 days, patient will work with PharmD and providers to maintain A1c goal <7% . Current regimen:  o Janumet 50/1000mg  twice daily with a meal . Interventions: o Provided dietary recommendations o Counseled on the PLATE method for meal planning o Advised patient to limit sweet o Contacted [employer] patient assistance program to verify refills remaining on Janumet . Patient self care activities - Over the next 180 days, patient will: o Check blood sugar once daily, document, and provide at future appointments o Contact provider with any episodes of hypoglycemia o Focus on eating a well-balanced diet, limiting carbohydrates o Reduce consumption of sweets and junk foods  Medication management . Pharmacist Clinical Goal(s): o Over the next 180 days, patient will work with PharmD and providers to maintain optimal medication adherence . [facility] 11/[facility]/2019 . Interventions o Comprehensive medication review performed. o Continue current medication management strategy . Patient self care activities - Over the next 180 days, patient will: o Focus on medication adherence by utilizing a system to remember to take medications o Take medications as prescribed o Report any questions or concerns to PharmD and/or provider(s)  Initial goal documentation        Marcus Davis was given information about Chronic Care Management services today including:  1. CCM service includes personalized support from designated clinical staff supervised by his physician, including individualized plan of care and coordination with other care providers 2. 24/7 contact phone numbers for assistance for urgent and routine care needs. 3.  Standard insurance, coinsurance, copays and deductibles apply for chronic care management only during months in which we provide at least 20 minutes of these services. Most insurances cover these services at 100%, however  patients may be responsible for any copay, coinsurance and/or deductible if applicable. This service may help you avoid the need for more expensive face-to-face services. 4. Only one practitioner may furnish and bill the service in a calendar month. 5. The patient may stop CCM services at any time (effective at the end of the month) by phone call to the office staff.  Patient agreed to services and verbal consent obtained.   The patient verbalized understanding of instructions provided today and agreed to receive a mailed copy of patient instruction and/or educational materials. Face to Face appointment with pharmacist scheduled for: 04/09/20 @ 3PM  Beryle Flock, PharmD Clinical Pharmacist [facility] [phone]    Diabetes Mellitus and Nutrition, Adult When you have diabetes (diabetes mellitus), it is very important to have healthy eating habits because your blood sugar (glucose) levels are greatly affected by what you eat and drink. Eating healthy foods in the appropriate amounts, at about the same times every day, can help you:  Control your blood glucose.  Lower your risk of heart disease.  Improve your blood pressure.  Reach or maintain a healthy weight. Every person with diabetes is different, and each person has different needs for a meal plan. Your health care provider may recommend that you work with a diet and nutrition specialist (dietitian) to make a meal plan that is best for you. Your meal plan may vary depending on factors such as:  The calories you need.  The medicines you take.  Your weight.  Your blood glucose, blood pressure, and cholesterol levels.  Your activity level.  Other health conditions you have, such as heart or kidney disease. How do carbohydrates affect me? Carbohydrates, also called carbs, affect your blood glucose level more than any other type of food. Eating carbs naturally raises the amount of glucose in your  blood. Carb counting is a method for keeping track of how many carbs you eat. Counting carbs is important to keep your blood glucose at a healthy level, especially if you use insulin or take certain oral diabetes medicines. It is important to know how many carbs you can safely have in each meal. This is different for every person. Your dietitian can help you calculate how many carbs you should have at each meal and for each snack. Foods that contain carbs include:  Bread, cereal, rice, pasta, and crackers.  Potatoes and corn.  Peas, beans, and lentils.  Milk and yogurt.  Fruit and juice.  Desserts, such as cakes, cookies, ice cream, and candy. How does alcohol affect me? Alcohol can cause a sudden decrease in blood glucose (hypoglycemia), especially if you use insulin or take certain oral diabetes medicines. Hypoglycemia can be a life-threatening condition. Symptoms of hypoglycemia (sleepiness, dizziness, and confusion) are similar to symptoms of having too much alcohol. If your health care provider says that alcohol is safe for you, follow these guidelines:  Limit alcohol intake to no more than 1 drink per day for nonpregnant women and 2 drinks per day for men. One drink equals 12 oz of beer, 5 oz of wine, or 1 oz of hard liquor.  Do not drink on an empty stomach.  Keep yourself hydrated with water, diet soda, or unsweetened iced tea.  Keep in mind that regular soda, juice,  and other mixers may contain a lot of sugar and must be counted as carbs. What are tips for following this plan?  Reading food labels  Start by checking the serving size on the "Nutrition Facts" label of packaged foods and drinks. The amount of calories, carbs, fats, and other nutrients listed on the label is based on one serving of the item. Many items contain more than one serving per package.  Check the total grams (g) of carbs in one serving. You can calculate the number of servings of carbs in one serving by  dividing the total carbs by 15. For example, if a food has 30 g of total carbs, it would be equal to 2 servings of carbs.  Check the number of grams (g) of saturated and trans fats in one serving. Choose foods that have low or no amount of these fats.  Check the number of milligrams (mg) of salt (sodium) in one serving. Most people should limit total sodium intake to less than 2,300 mg per day.  Always check the nutrition information of foods labeled as "low-fat" or "nonfat". These foods may be higher in added sugar or refined carbs and should be avoided.  Talk to your dietitian to identify your daily goals for nutrients listed on the label. Shopping  Avoid buying canned, premade, or processed foods. These foods tend to be high in fat, sodium, and added sugar.  Shop around the outside edge of the grocery store. This includes fresh fruits and vegetables, bulk grains, fresh meats, and fresh dairy. Cooking  Use low-heat cooking methods, such as baking, instead of high-heat cooking methods like deep frying.  Cook using healthy oils, such as olive, canola, or sunflower oil.  Avoid cooking with butter, cream, or high-fat meats. Meal planning  Eat meals and snacks regularly, preferably at the same times every day. Avoid going long periods of time without eating.  Eat foods high in fiber, such as fresh fruits, vegetables, beans, and whole grains. Talk to your dietitian about how many servings of carbs you can eat at each meal.  Eat 4-6 ounces (oz) of lean protein each day, such as lean meat, chicken, fish, eggs, or tofu. One oz of lean protein is equal to: ? 1 oz of meat, chicken, or fish. ? 1 egg. ?  cup of tofu.  Eat some foods each day that contain healthy fats, such as avocado, nuts, seeds, and fish. Lifestyle  Check your blood glucose regularly.  Exercise regularly as told by your health care provider. This may include: ? 150 minutes of moderate-intensity or vigorous-intensity  exercise each week. This could be brisk walking, biking, or water aerobics. ? Stretching and doing strength exercises, such as yoga or weightlifting, at least 2 times a week.  Take medicines as told by your health care provider.  Do not use any products that contain nicotine or tobacco, such as cigarettes and e-cigarettes. If you need help quitting, ask your health care provider.  Work with a Veterinary surgeon or diabetes educator to identify strategies to manage stress and any emotional and social challenges. Questions to ask a health care provider  Do I need to meet with a diabetes educator?  Do I need to meet with a dietitian?  What number can I call if I have questions?  When are the best times to check my blood glucose? Where to find more information:  American Diabetes Association: diabetes.org  Academy of Nutrition and Dietetics: www.eatright.[employer] of Diabetes and  Digestive and Kidney Diseases (NIH): [employer] Summary  A healthy meal plan will help you control your blood glucose and maintain a healthy lifestyle.  Working with a diet and nutrition specialist (dietitian) can help you make a meal plan that is best for you.  Keep in mind that carbohydrates (carbs) and alcohol have immediate effects on your blood glucose levels. It is important to count carbs and to use alcohol carefully. This information is not intended to replace advice given to you by your health care provider. Make sure you discuss any questions you have with your health care provider. Document Revised: 08/12/2017 Document Reviewed: 10/04/2016 Elsevier Patient Education  2020 [employer].

## 2020-04-09 ENCOUNTER — Ambulatory Visit (INDEPENDENT_AMBULATORY_CARE_PROVIDER_SITE_OTHER): Payer: Medicare HMO | Admitting: Internal Medicine

## 2020-04-09 ENCOUNTER — Ambulatory Visit (INDEPENDENT_AMBULATORY_CARE_PROVIDER_SITE_OTHER): Payer: Medicare HMO

## 2020-04-09 ENCOUNTER — Encounter: Payer: Self-pay | Admitting: Internal Medicine

## 2020-04-09 ENCOUNTER — Ambulatory Visit: Payer: Self-pay

## 2020-04-09 ENCOUNTER — Other Ambulatory Visit: Payer: Self-pay

## 2020-04-09 VITALS — BP 124/64 | HR 69 | Temp 98.7°F | Ht 69.0 in | Wt 216.2 lb

## 2020-04-09 VITALS — BP 124/64 | HR 85 | Temp 98.7°F | Ht 69.0 in | Wt 216.2 lb

## 2020-04-09 DIAGNOSIS — E1141 Type 2 diabetes mellitus with diabetic mononeuropathy: Secondary | ICD-10-CM

## 2020-04-09 DIAGNOSIS — E559 Vitamin D deficiency, unspecified: Secondary | ICD-10-CM | POA: Diagnosis not present

## 2020-04-09 DIAGNOSIS — E1165 Type 2 diabetes mellitus with hyperglycemia: Secondary | ICD-10-CM | POA: Diagnosis not present

## 2020-04-09 DIAGNOSIS — Z0000 Encounter for general adult medical examination without abnormal findings: Secondary | ICD-10-CM

## 2020-04-09 DIAGNOSIS — R351 Nocturia: Secondary | ICD-10-CM

## 2020-04-09 DIAGNOSIS — I890 Lymphedema, not elsewhere classified: Secondary | ICD-10-CM

## 2020-04-09 DIAGNOSIS — Z6831 Body mass index (BMI) 31.0-31.9, adult: Secondary | ICD-10-CM

## 2020-04-09 DIAGNOSIS — E6609 Other obesity due to excess calories: Secondary | ICD-10-CM

## 2020-04-09 DIAGNOSIS — I10 Essential (primary) hypertension: Secondary | ICD-10-CM

## 2020-04-09 LAB — POCT URINALYSIS DIPSTICK
Bilirubin, UA: NEGATIVE
Blood, UA: NEGATIVE
Glucose, UA: POSITIVE — AB
Leukocytes, UA: NEGATIVE
Nitrite, UA: NEGATIVE
Protein, UA: POSITIVE — AB
Spec Grav, UA: 1.02 (ref 1.010–1.025)
Urobilinogen, UA: 0.2 E.U./dL
pH, UA: 6 (ref 5.0–8.0)

## 2020-04-09 LAB — POCT UA - MICROALBUMIN
Creatinine, POC: 200 mg/dL
Microalbumin Ur, POC: 80 mg/L

## 2020-04-09 MED ORDER — PREGABALIN 100 MG PO CAPS: 100.0000 mg | ORAL_CAPSULE | Freq: Two times a day (BID) | ORAL | 2 refills | Status: DC

## 2020-04-09 NOTE — Progress Notes (Signed)
I,Katawbba Wiggins,acting as a Education administrator for [name], MD.,have documented all relevant documentation on the behalf of [name], MD,as directed by  [name], MD while in the presence of [name], MD.  This visit occurred during the SARS-CoV-2 public health emergency.  Safety protocols were in place, including screening questions prior to the visit, additional usage of staff PPE, and extensive cleaning of exam room while observing appropriate contact time as indicated for disinfecting solutions.  Subjective:     Patient ID: Marcus Davis , male    DOB: 1951-03-28 , 69 y.o.   MRN: [id number]   Chief Complaint  Patient presents with  . Annual Exam  . Diabetes  . Hypertension    HPI  He is here today for f/u DM/HTN. He reports compliance with meds. His last visit was in March 2021. Unfortunately, since his last visit, his wife has passed.   Diabetes He presents for his follow-up diabetic visit. He has type 2 diabetes mellitus. There are no hypoglycemic associated symptoms. There are no diabetic associated symptoms. Pertinent negatives for diabetes include no blurred vision and no chest pain. There are no hypoglycemic complications. Risk factors for coronary artery disease include diabetes mellitus, dyslipidemia, hypertension, male sex and sedentary lifestyle. He is compliant with treatment most of the time. He is following a diabetic diet. He participates in exercise intermittently. Eye exam is current.  Hypertension This is a chronic problem. The current episode started more than 1 year ago. The problem has been gradually improving since onset. The problem is controlled. Pertinent negatives include no blurred vision, chest pain or palpitations. The current treatment provides moderate improvement.     Past Medical History:  Diagnosis Date  . Allergy   . DM (diabetes mellitus) ([place])   . HTN (hypertension)   . Hyperlipidemia      Family History  Problem Relation  Age of Onset  . Diabetes Mother   . Hypertension Mother   . Stroke Mother   . Hypertension Father   . Diabetes Father   . Stroke Paternal Grandfather      Current Outpatient Medications:  .  amLODipine (NORVASC) 10 MG tablet, Take by mouth., Disp: , Rfl:  .  aspirin EC 81 MG tablet, Take 81 mg by mouth daily., Disp: , Rfl:  .  atorvastatin (LIPITOR) 40 MG tablet, Take 40 mg by mouth daily. 1/2 tablet daily, Disp: , Rfl:  .  hydrochlorothiazide (HYDRODIURIL) 25 MG tablet, Take by mouth., Disp: , Rfl:  .  lisinopril (PRINIVIL,ZESTRIL) 40 MG tablet, Take by mouth., Disp: , Rfl:  .  pregabalin (LYRICA) 50 MG capsule, Take 1 capsule by mouth twice daily (Patient not taking: Reported on 04/09/2020), Disp: 60 capsule, Rfl: 5 .  pregabalin (LYRICA) 75 MG capsule, Take 1 capsule (75 mg total) by mouth 2 (two) times daily., Disp: 180 capsule, Rfl: 1 .  sitaGLIPtin-metformin (JANUMET) 50-1000 MG tablet, Take 1 tablet by mouth 2 (two) times daily with a meal., Disp: , Rfl:  .  Vitamin D, Cholecalciferol, 25 MCG (1000 UT) CAPS, Take by mouth., Disp: , Rfl:    Allergies  Allergen Reactions  . Pollen Extract Other (See Comments)    Sneezing, runny nose, congestion,      Men's preventive visit. Patient Health Questionnaire (PHQ-2) is    Clinical Support from 04/09/2020 in [facility]  PHQ-2 Total Score 0    . Patient is on a healthy diet. Marital status: Widowed. Relevant  history for alcohol use is:  Social History   Substance and Sexual Activity  Alcohol Use Not Currently   Comment: Holidays  . Relevant history for tobacco use is:  Social History   Tobacco Use  Smoking Status Never Smoker  Smokeless Tobacco Never Used  .   Review of Systems  Constitutional: Negative.   HENT: Negative.   Eyes: Negative.  Negative for blurred vision.  Respiratory: Negative.   Cardiovascular: Negative.  Negative for chest pain and palpitations.  Endocrine: Negative.    Genitourinary: Negative.        He c/o nocturia. He admits that his sx have improved since taking HCTZ earlier in the day  Musculoskeletal: Negative.   Skin: Negative.   Allergic/Immunologic: Negative.   Neurological: Negative.   Hematological: Negative.   Psychiatric/Behavioral: Negative.      Today's Vitals   04/09/20 1443  BP: (!) 124/64  Pulse: 69  Temp: 98.7 F (37.1 C)  TempSrc: Oral  Weight: (!) 216 lb 3.2 oz (98.1 kg)  Height: 5' 9"  (1.753 m)  PainSc: 0-No pain   Body mass index is 31.93 kg/m.  Wt Readings from Last 3 Encounters:  04/09/20 (!) 216 lb 3.2 oz (98.1 kg)  04/09/20 (!) 216 lb 3.2 oz (98.1 kg)  11/27/19 210 lb (95.3 kg)   Objective:  Physical Exam Vitals and nursing note reviewed.  Constitutional:      Appearance: Normal appearance.  HENT:     Head: Normocephalic and atraumatic.     Right Ear: Tympanic membrane, ear canal and external ear normal.     Left Ear: Tympanic membrane, ear canal and external ear normal.     Nose:     Comments: Deferred, masked    Mouth/Throat:     Comments: Deferred, masked Eyes:     Extraocular Movements: Extraocular movements intact.     Conjunctiva/sclera: Conjunctivae normal.     Pupils: Pupils are equal, round, and reactive to light.  Cardiovascular:     Rate and Rhythm: Normal rate and regular rhythm.     Pulses:          Dorsalis pedis pulses are 1+ on the right side and 1+ on the left side.     Heart sounds: Normal heart sounds.  Pulmonary:     Effort: Pulmonary effort is normal.     Breath sounds: Normal breath sounds.  Chest:     Breasts:        Right: Normal. No swelling, bleeding, inverted nipple, mass or nipple discharge.        Left: Normal. No swelling, bleeding, inverted nipple, mass or nipple discharge.  Abdominal:     General: Abdomen is flat. Bowel sounds are normal.     Palpations: Abdomen is soft.  Genitourinary:    Prostate: Normal.     Rectum: Normal. Guaiac result negative.   Musculoskeletal:        General: Normal range of motion.     Cervical back: Normal range of motion and neck supple.     Right lower leg: Edema present.     Left lower leg: Edema present.  Feet:     Right foot:     Protective Sensation: 5 sites tested. 5 sites sensed.     Skin integrity: Callus and dry skin present.     Toenail Condition: Right toenails are abnormally thick.     Left foot:     Protective Sensation: 5 sites tested. 5 sites sensed.  Skin integrity: Callus and dry skin present.     Toenail Condition: Left toenails are abnormally thick.  Skin:    General: Skin is warm.     Comments: Thickened skin on RLE, brawny.   Neurological:     General: No focal deficit present.     Mental Status: He is alert.  Psychiatric:        Mood and Affect: Mood normal.        Behavior: Behavior normal.         Assessment And Plan:   1. Routine general medical examination at a health care facility  A full exam was performed.  DRE deferred, per patient. PATIENT IS ADVISED TO GET 30-45 MINUTES REGULAR EXERCISE NO LESS THAN FOUR TO FIVE DAYS PER WEEK - BOTH WEIGHTBEARING EXERCISES AND AEROBIC ARE RECOMMENDED.  PATIENT IS ADVISED TO FOLLOW A HEALTHY DIET WITH AT LEAST SIX FRUITS/VEGGIES PER DAY, DECREASE INTAKE OF RED MEAT, AND TO INCREASE FISH INTAKE TO TWO DAYS PER WEEK.  MEATS/FISH SHOULD NOT BE FRIED, BAKED OR BROILED IS PREFERABLE.  I SUGGEST WEARING SPF 50 SUNSCREEN ON EXPOSED PARTS AND ESPECIALLY WHEN IN THE DIRECT SUNLIGHT FOR AN EXTENDED PERIOD OF TIME.  PLEASE AVOID FAST FOOD RESTAURANTS AND INCREASE YOUR WATER INTAKE.  2. Type 2 diabetes mellitus with diabetic mononeuropathy, without long-term current use of insulin ([place])  Diabetic foot exam was performed. I DISCUSSED WITH THE PATIENT AT LENGTH REGARDING THE GOALS OF GLYCEMIC CONTROL AND POSSIBLE LONG-TERM COMPLICATIONS.  I  ALSO STRESSED THE IMPORTANCE OF COMPLIANCE WITH HOME GLUCOSE MONITORING, DIETARY RESTRICTIONS INCLUDING  AVOIDANCE OF SUGARY DRINKS/PROCESSED FOODS,  ALONG WITH REGULAR EXERCISE.  I  ALSO STRESSED THE IMPORTANCE OF ANNUAL EYE EXAMS, SELF FOOT CARE AND COMPLIANCE WITH OFFICE VISITS.  - CBC - Hemoglobin A1c - CMP14+EGFR  3. Essential hypertension  Chronic, well controlled. He will continue with current meds for now . He is encouraged to avoid adding salt to his foods. EKG performed, NSR w/o acute changes.  - EKG 12-Lead  4. Lymphedema  Chronic. I will refer him to lymphatic PT. He is in agreement with his treatment plan.   5. Vitamin D deficiency  I WILL CHECK A VIT D LEVEL AND SUPPLEMENT AS NEEDED.  ALSO ENCOURAGED TO SPEND 15 MINUTES IN THE SUN DAILY.  - VITAMIN D 25 Hydroxy (Vit-D Deficiency, Fractures)  6. Nocturia  Improved since taking HCTZ in am.  - PSA  7. Class 1 obesity due to excess calories with serious comorbidity and body mass index (BMI) of 31.0 to 31.9 in adult  He is encouraged to initially strive for BMI less than 30 to decrease cardiac risk. He is advised to exercise no less than 150 minutes per week.    Patient was given opportunity to ask questions. Patient verbalized understanding of the plan and was able to repeat key elements of the plan. All questions were answered to their satisfaction.   [name], MD   I, [name], MD, have reviewed all documentation for this visit. The documentation on 04/09/20 for the exam, diagnosis, procedures, and orders are all accurate and complete.  THE PATIENT IS ENCOURAGED TO PRACTICE SOCIAL DISTANCING DUE TO THE COVID-19 PANDEMIC.

## 2020-04-09 NOTE — Patient Instructions (Signed)
[name] , Thank you for taking time to come for your Medicare Wellness Visit. I appreciate your ongoing commitment to your health goals. Please review the following plan we discussed and let me know if I can assist you in the future.   Screening recommendations/referrals: Colonoscopy: cologuard 03/06/2019 Recommended yearly ophthalmology/optometry visit for glaucoma screening and checkup Recommended yearly dental visit for hygiene and checkup  Vaccinations: Influenza vaccine: decline Pneumococcal vaccine: had one but not sure which (at the [place]) Tdap vaccine: completed 02/19/2016 Shingles vaccine: discussed   Covid-19: 12/17/2019, 11/17/2019  Advanced directives: Advance directive discussed with you today. Even though you declined this today please call our office should you change your mind and we can give you the proper paperwork for you to fill out.  Conditions/risks identified: none  Next appointment:08/13/2020 at 11:45  Follow up in one year for your annual wellness visit.   Preventive Care 4 Years and Older, Male Preventive care refers to lifestyle choices and visits with your health care provider that can promote health and wellness. What does preventive care include?  A yearly physical exam. This is also called an annual well check.  Dental exams once or twice a year.  Routine eye exams. Ask your health care provider how often you should have your eyes checked.  Personal lifestyle choices, including:  Daily care of your teeth and gums.  Regular physical activity.  Eating a healthy diet.  Avoiding tobacco and drug use.  Limiting alcohol use.  Practicing safe sex.  Taking low doses of aspirin every day.  Taking vitamin and mineral supplements as recommended by your health care provider. What happens during an annual well check? The services and screenings done by your health care provider during your annual well check will depend on your age, overall health, lifestyle  risk factors, and family history of disease. Counseling  Your health care provider may ask you questions about your:  Alcohol use.  Tobacco use.  Drug use.  Emotional well-being.  Home and relationship well-being.  Sexual activity.  Eating habits.  History of falls.  Memory and ability to understand (cognition).  Work and work Astronomer. Screening  You may have the following tests or measurements:  Height, weight, and BMI.  Blood pressure.  Lipid and cholesterol levels. These may be checked every 5 years, or more frequently if you are over 52 years old.  Skin check.  Lung cancer screening. You may have this screening every year starting at age 25 if you have a 30-pack-year history of smoking and currently smoke or have quit within the past 15 years.  Fecal occult blood test (FOBT) of the stool. You may have this test every year starting at age 41.  Flexible sigmoidoscopy or colonoscopy. You may have a sigmoidoscopy every 5 years or a colonoscopy every 10 years starting at age 53.  Prostate cancer screening. Recommendations will vary depending on your family history and other risks.  Hepatitis C blood test.  Hepatitis B blood test.  Sexually transmitted disease (STD) testing.  Diabetes screening. This is done by checking your blood sugar (glucose) after you have not eaten for a while (fasting). You may have this done every 1-3 years.  Abdominal aortic aneurysm (AAA) screening. You may need this if you are a current or former smoker.  Osteoporosis. You may be screened starting at age 58 if you are at high risk. Talk with your health care provider about your test results, treatment options, and if necessary, the need  for more tests. Vaccines  Your health care provider may recommend certain vaccines, such as:  Influenza vaccine. This is recommended every year.  Tetanus, diphtheria, and acellular pertussis (Tdap, Td) vaccine. You may need a Td booster every 10  years.  Zoster vaccine. You may need this after age 31.  Pneumococcal 13-valent conjugate (PCV13) vaccine. One dose is recommended after age 84.  Pneumococcal polysaccharide (PPSV23) vaccine. One dose is recommended after age 53. Talk to your health care provider about which screenings and vaccines you need and how often you need them. This information is not intended to replace advice given to you by your health care provider. Make sure you discuss any questions you have with your health care provider. Document Released: 09/26/2015 Document Revised: 05/19/2016 Document Reviewed: 07/01/2015 Elsevier Interactive Patient Education  2017 [place] Prevention in the Home Falls can cause injuries. They can happen to people of all ages. There are many things you can do to make your home safe and to help prevent falls. What can I do on the outside of my home?  Regularly fix the edges of walkways and driveways and fix any cracks.  Remove anything that might make you trip as you walk through a door, such as a raised step or threshold.  Trim any bushes or trees on the path to your home.  Use bright outdoor lighting.  Clear any walking paths of anything that might make someone trip, such as rocks or tools.  Regularly check to see if handrails are loose or broken. Make sure that both sides of any steps have handrails.  Any raised decks and porches should have guardrails on the edges.  Have any leaves, snow, or ice cleared regularly.  Use sand or salt on walking paths during winter.  Clean up any spills in your garage right away. This includes oil or grease spills. What can I do in the bathroom?  Use night lights.  Install grab bars by the toilet and in the tub and shower. Do not use towel bars as grab bars.  Use non-skid mats or decals in the tub or shower.  If you need to sit down in the shower, use a plastic, non-slip stool.  Keep the floor dry. Clean up any water that  spills on the floor as soon as it happens.  Remove soap buildup in the tub or shower regularly.  Attach bath mats securely with double-sided non-slip rug tape.  Do not have throw rugs and other things on the floor that can make you trip. What can I do in the bedroom?  Use night lights.  Make sure that you have a light by your bed that is easy to reach.  Do not use any sheets or blankets that are too big for your bed. They should not hang down onto the floor.  Have a firm chair that has side arms. You can use this for support while you get dressed.  Do not have throw rugs and other things on the floor that can make you trip. What can I do in the kitchen?  Clean up any spills right away.  Avoid walking on wet floors.  Keep items that you use a lot in easy-to-reach places.  If you need to reach something above you, use a strong step stool that has a grab bar.  Keep electrical cords out of the way.  Do not use floor polish or wax that makes floors slippery. If you must use wax, use  non-skid floor wax.  Do not have throw rugs and other things on the floor that can make you trip. What can I do with my stairs?  Do not leave any items on the stairs.  Make sure that there are handrails on both sides of the stairs and use them. Fix handrails that are broken or loose. Make sure that handrails are as long as the stairways.  Check any carpeting to make sure that it is firmly attached to the stairs. Fix any carpet that is loose or worn.  Avoid having throw rugs at the top or bottom of the stairs. If you do have throw rugs, attach them to the floor with carpet tape.  Make sure that you have a light switch at the top of the stairs and the bottom of the stairs. If you do not have them, ask someone to add them for you. What else can I do to help prevent falls?  Wear shoes that:  Do not have high heels.  Have rubber bottoms.  Are comfortable and fit you well.  Are closed at the  toe. Do not wear sandals.  If you use a stepladder:  Make sure that it is fully opened. Do not climb a closed stepladder.  Make sure that both sides of the stepladder are locked into place.  Ask someone to hold it for you, if possible.  Clearly mark and make sure that you can see:  Any grab bars or handrails.  First and last steps.  Where the edge of each step is.  Use tools that help you move around (mobility aids) if they are needed. These include:  Canes.  Walkers.  Scooters.  Crutches.  Turn on the lights when you go into a dark area. Replace any light bulbs as soon as they burn out.  Set up your furniture so you have a clear path. Avoid moving your furniture around.  If any of your floors are uneven, fix them.  If there are any pets around you, be aware of where they are.  Review your medicines with your doctor. Some medicines can make you feel dizzy. This can increase your chance of falling. Ask your doctor what other things that you can do to help prevent falls. This information is not intended to replace advice given to you by your health care provider. Make sure you discuss any questions you have with your health care provider. Document Released: 06/26/2009 Document Revised: 02/05/2016 Document Reviewed: 10/04/2014 Elsevier Interactive Patient Education  2017 [employer].

## 2020-04-09 NOTE — Patient Instructions (Signed)
Health Maintenance After Age 67 After age 67, you are at a higher risk for certain long-term diseases and infections as well as injuries from falls. Falls are a major cause of broken bones and head injuries in people who are older than age 67. Getting regular preventive care can help to keep you healthy and well. Preventive care includes getting regular testing and making lifestyle changes as recommended by your health care provider. Talk with your health care provider about:  Which screenings and tests you should have. A screening is a test that checks for a disease when you have no symptoms.  A diet and exercise plan that is right for you. What should I know about screenings and tests to prevent falls? Screening and testing are the best ways to find a health problem early. Early diagnosis and treatment give you the best chance of managing medical conditions that are common after age 67. Certain conditions and lifestyle choices may make you more likely to have a fall. Your health care provider may recommend:  Regular vision checks. Poor vision and conditions such as cataracts can make you more likely to have a fall. If you wear glasses, make sure to get your prescription updated if your vision changes.  Medicine review. Work with your health care provider to regularly review all of the medicines you are taking, including over-the-counter medicines. Ask your health care provider about any side effects that may make you more likely to have a fall. Tell your health care provider if any medicines that you take make you feel dizzy or sleepy.  Osteoporosis screening. Osteoporosis is a condition that causes the bones to get weaker. This can make the bones weak and cause them to break more easily.  Blood pressure screening. Blood pressure changes and medicines to control blood pressure can make you feel dizzy.  Strength and balance checks. Your health care provider may recommend certain tests to check your  strength and balance while standing, walking, or changing positions.  Foot health exam. Foot pain and numbness, as well as not wearing proper footwear, can make you more likely to have a fall.  Depression screening. You may be more likely to have a fall if you have a fear of falling, feel emotionally low, or feel unable to do activities that you used to do.  Alcohol use screening. Using too much alcohol can affect your balance and may make you more likely to have a fall. What actions can I take to lower my risk of falls? General instructions  Talk with your health care provider about your risks for falling. Tell your health care provider if: ? You fall. Be sure to tell your health care provider about all falls, even ones that seem minor. ? You feel dizzy, sleepy, or off-balance.  Take over-the-counter and prescription medicines only as told by your health care provider. These include any supplements.  Eat a healthy diet and maintain a healthy weight. A healthy diet includes low-fat dairy products, low-fat (lean) meats, and fiber from whole grains, beans, and lots of fruits and vegetables. Home safety  Remove any tripping hazards, such as rugs, cords, and clutter.  Install safety equipment such as grab bars in bathrooms and safety rails on stairs.  Keep rooms and walkways well-lit. Activity   Follow a regular exercise program to stay fit. This will help you maintain your balance. Ask your health care provider what types of exercise are appropriate for you.  If you need a cane or   walker, use it as recommended by your health care provider.  Wear supportive shoes that have nonskid soles. Lifestyle  Do not drink alcohol if your health care provider tells you not to drink.  If you drink alcohol, limit how much you have: ? 0-1 drink a day for women. ? 0-2 drinks a day for men.  Be aware of how much alcohol is in your drink. In the [place] one drink equals one typical bottle of beer (12  oz), one-half glass of wine (5 oz), or one shot of hard liquor (1 oz).  Do not use any products that contain nicotine or tobacco, such as cigarettes and e-cigarettes. If you need help quitting, ask your health care provider. Summary  Having a healthy lifestyle and getting preventive care can help to protect your health and wellness after age 67.  Screening and testing are the best way to find a health problem early and help you avoid having a fall. Early diagnosis and treatment give you the best chance for managing medical conditions that are more common for people who are older than age 67.  Falls are a major cause of broken bones and head injuries in people who are older than age 67. Take precautions to prevent a fall at home.  Work with your health care provider to learn what changes you can make to improve your health and wellness and to prevent falls. This information is not intended to replace advice given to you by your health care provider. Make sure you discuss any questions you have with your health care provider. Document Revised: 12/21/2018 Document Reviewed: 07/13/2017 Elsevier Patient Education  2020 Elsevier Inc.  

## 2020-04-09 NOTE — Chronic Care Management (AMB) (Deleted)
Chronic Care Management Pharmacy  Name: Jaivion Kingsley  MRN: [id number] DOB: Apr 23, 1951  Chief Complaint/ HPI  Cheryle Horsfall,  69 y.o. , male presents for their Follow-Up CCM visit with the clinical pharmacist In office.  PCP : Glendale Chard, MD  Their chronic conditions include: Hypertension, Diabetes, Hypercholesterolemia  Office Visits: 11/27/19 OV: Presented for HTN and DM follow up. Pt reported Lyrica 39m twice daily had greatly improved his neuropathy symptoms. Increased Lyrica to 737mtwice daily since symptoms improved but not resolved. Labs ordered (Lipid panel, HgbA1c, BMP8+EGFR). Lymphedema appears to be improving. HTN controlled  10/12/19 Telephone call: Pt contacted office regarding neurology referral for numbness in feet. Instructed that no neurology referral was needed at this time. Gabapentin 10035mightly offered, but pt said he tried in the past with no improvement. Rx sent in for Lyrica. Pt instructed to follow up in 4 weeks.   09/03/19 OV: Presented for HTN and DM follow up. Labs ordered (CMP14+EGFR, HgbA1c, Vitamin B12, TSH). Referred to Physical Therapy for lymphedema. Noted symptoms suggestive of diabetic neuropathy. Exercise and weight loss recommendations provided. Pt instructed not to take B12 supplement on weekends due to elevated B12 levels.   Consult Visit: 09/27/19 PT OV w/ C. Dowtin: Pt reported insidious onset of right lower leg/foot edema. Elevation overnight helps, but swelling returns within 1 hour. 15-20 mmHg compression socks still allow swelling and dig into upper calf. Was not able to get on 20-30 mmHg compression socks recommended by PCP. Pt fitted for Circaid juxta-lite lower leg garments, PAC bands, and Farrow basic leg piece. Pt shown how to use ButHalesiter putting on compression socks. Provided pt education.   CCM Encounters: 02/07/20 SW: Confirmed pt has received assistance for Janumet. Reviewed and provided education regarding advanced directives.    09/03/19 PharmD: Assisted pt with PAP application at PCP OV today for Janumet (pt reported cost prohibitive on [employer]). Comprehensive medication review performed. Pt reports compliance, but no fill history available. Review compliance at follow up.   Medications: Outpatient Encounter Medications as of 04/09/2020  Medication Sig  . amLODipine (NORVASC) 10 MG tablet Take by mouth.  . aMarland Kitchenpirin EC 81 MG tablet Take 81 mg by mouth daily.  . aMarland Kitchenorvastatin (LIPITOR) 40 MG tablet Take 40 mg by mouth daily. 1/2 tablet daily  . hydrochlorothiazide (HYDRODIURIL) 25 MG tablet Take by mouth.  . lMarland Kitchensinopril (PRINIVIL,ZESTRIL) 40 MG tablet Take by mouth.  . pregabalin (LYRICA) 50 MG capsule Take 1 capsule by mouth twice daily  . pregabalin (LYRICA) 75 MG capsule Take 1 capsule (75 mg total) by mouth 2 (two) times daily.  . sitaGLIPtin-metformin (JANUMET) 50-1000 MG tablet Take 1 tablet by mouth 2 (two) times daily with a meal.  . Vitamin D, Cholecalciferol, 25 MCG (1000 UT) CAPS Take by mouth.   No facility-administered encounter medications on file as of 04/09/2020.    Current Diagnosis/Assessment:    Goals Addressed   None     Diabetes   Recent Relevant Labs: Lab Results  Component Value Date/Time   HGBA1C 6.9 (H) 11/27/2019 02:54 PM   HGBA1C 6.1 (H) 09/03/2019 12:18 PM   MICROALBUR 80 01/30/2019 11:53 AM    Checking BG: Daily  Recent FBG Readings: 110, 126 Recent pre-meal BG readings:  Recent 2hr PP BG readings:   Recent HS BG readings:  Patient has failed these meds in past: N/A Patient is currently controlled on the following medications:   Janumet 50/1000mg twice daily with a meal  Aspirin 33m23m  daily  Last diabetic Foot exam: 01/30/19 Last diabetic Eye exam: Lab Results  Component Value Date/Time   HMDIABEYEEXA Retinopathy (A) 05/24/2019 12:00 AM   We discussed:   Diet extensively  Snacks more than he should  Doesn't like fast food  Likes soup and sandwich from  [employer]  Chicken, steak, fish, sauteed spinach, some salad   Drinks: Flavored seltzers (not sweetened), some bottled water  Recommend PLATE method for meal planning a well-balanced diet  Recommend limit sweets  Recommend increase water intake to 64oz daily  Exercise extensively  Pt walks 3 times weekly for about an hour each time at local parks [place], [place], [place]  Pt feels that recent increase in BG/A1c has been due to stress from wife passing away  Pt receives Janumet from [employer] PAP.   Next shipment scheduled for delivery on 03/05/20 for 90 day supply per Caren Griffins at [facility]. 3 refills remaining on prescription. Pt has to call for refills.   Plan -Continue current medications   Hypertension   Office blood pressures are  BP Readings from Last 3 Encounters:  11/27/19 116/74  09/03/19 114/68  07/17/19 132/80   Patient has failed these meds in the past: N/A Patient is currently controlled on the following medications:   Amlodipine 3m daily  HCTZ 251mdaily  Lisinopril 4066maily  Patient checks BP at home infrequently  Patient home BP readings are ranging: None to report  We discussed:  Diet and exercise extensively  [facility] [place] [facility] and BP was 132/80  Check BP occasionally and if symptomatic  Denies nocturia with HCTZ  Goal BP <130/80  Plan -Continue current medications    Hyperlipidemia   Lipid Panel     Component Value Date/Time   CHOL 113 11/27/2019 1454   TRIG 250 (H) 11/27/2019 1454   HDL 32 (L) 11/27/2019 1454   LDLCALC 42 11/27/2019 1454    The ASCVD Risk score (Goff DC Jr., et al., 2013) failed to calculate for the following reasons:   The valid total cholesterol range is 130 to 320 mg/dL   Patient has failed these meds in past: N/A Patient is currently uncontrolled on the following medications:   Atorvastatin 68m14m2 tablet daily   We discussed:   Diet and exercise extensively  Reviewed and  discussed recent lipid panel   Elevated triglycerides (>150)  Low HDL (<40)  Recommended pt limit snacking on junk food  Recommend increased intake of healthy fats (avocados, walnuts, flaxseed, etc)  Plan -Continue current medications  Neuropathy   Patient has failed these meds in past: Gabapentin Patient is currently controlled on the following medications:   Pregabalin 75mg33mce daily  We discussed:    Pt reports neuropathy is better, has a lot more sensation in feet than before Lyrica  Plan -Continue current medications   Over the Counter Medications   Patient is currently on the following medications:   Cholecalciferol 1000 units daily  Plan -Continue current medications  Vaccines   Reviewed and discussed patient's vaccination history.   Immunization History  Administered Date(s) Administered  . Janssen (J&J) SARS-COV-2 Vaccination 12/17/2019   Pt states that he had a pneumonia shot 3-4 years ago. No records on [facility].  Plan  -Recommended patient receive Shingrix vaccine in pharmacy.   Med[facility] Management   [facility] Uses pill box? No - Has medications in a drawer and has a routine. Feels pill box is  too cumbersome Pt endorses 100% compliance  We discussed:   Importance of medication compliance  Plan Continue current medication management strategy   Follow up: 6 week phone visit  Jannette Fogo, PharmD Clinical Pharmacist [facility] [phone]

## 2020-04-09 NOTE — Progress Notes (Signed)
This visit occurred during the SARS-CoV-2 public health emergency.  Safety protocols were in place, including screening questions prior to the visit, additional usage of staff PPE, and extensive cleaning of exam room while observing appropriate contact time as indicated for disinfecting solutions.  Subjective:   Kyllian Clingerman is a 69 y.o. male who presents for Medicare Annual/Subsequent preventive examination.  Review of Systems     Cardiac Risk Factors include: advanced age (>86men, >74 women);diabetes mellitus;hypertension;male gender;obesity (BMI >30kg/m2)     Objective:    Today's Vitals   04/09/20 1424  BP: (!) 124/64  Pulse: 85  Temp: 98.7 F (37.1 C)  TempSrc: Oral  SpO2: 99%  Weight: (!) 216 lb 3.2 oz (98.1 kg)  Height:  (1.753 m)   Body mass index is 31.93 kg/m.  Advanced Directives 04/09/2020 02/07/2020 07/17/2019 01/30/2019  Does Patient Have a Medical Advance Directive? No No No No  Would patient like information on creating a medical advance directive? - Yes (MAU/Ambulatory/Procedural Areas - Information given) No - Patient declined No - Patient declined    Current Medications (verified) Outpatient Encounter Medications as of 04/09/2020  Medication Sig  . amLODipine (NORVASC) 10 MG tablet Take by mouth.  Marland Kitchen aspirin EC 81 MG tablet Take 81 mg by mouth daily.  Marland Kitchen atorvastatin (LIPITOR) 40 MG tablet Take 40 mg by mouth daily. 1/2 tablet daily  . hydrochlorothiazide (HYDRODIURIL) 25 MG tablet Take by mouth.  Marland Kitchen lisinopril (PRINIVIL,ZESTRIL) 40 MG tablet Take by mouth.  . sitaGLIPtin-metformin (JANUMET) 50-1000 MG tablet Take 1 tablet by mouth 2 (two) times daily with a meal.  . Vitamin D, Cholecalciferol, 25 MCG (1000 UT) CAPS Take by mouth.  . [DISCONTINUED] pregabalin (LYRICA) 75 MG capsule Take 1 capsule (75 mg total) by mouth 2 (two) times daily.  . [DISCONTINUED] pregabalin (LYRICA) 50 MG capsule Take 1 capsule by mouth twice daily (Patient not taking: Reported  on 04/09/2020)   No facility-administered encounter medications on file as of 04/09/2020.    Allergies (verified) Pollen extract   History: Past Medical History:  Diagnosis Date  . Allergy   . DM (diabetes mellitus) (HCC)   . HTN (hypertension)   . Hyperlipidemia    Past Surgical History:  Procedure Laterality Date  . CATARACT EXTRACTION Right 2019  . TONSILLECTOMY     age 17   Family History  Problem Relation Age of Onset  . Diabetes Mother   . Hypertension Mother   . Stroke Mother   . Hypertension Father   . Diabetes Father   . Stroke Paternal Grandfather    Social History   Socioeconomic History  . Marital status: Widowed    Spouse name: Not on file  . Number of children: Not on file  . Years of education: Not on file  . Highest education level: Not on file  Occupational History  . Occupation: retired  Tobacco Use  . Smoking status: Never Smoker  . Smokeless tobacco: Never Used  Vaping Use  . Vaping Use: Never used  Substance and Sexual Activity  . Alcohol use: Not Currently    Comment: Holidays  . Drug use: Never  . Sexual activity: Not Currently  Other Topics Concern  . Not on file  Social History Narrative  . Not on file   Social Determinants of Health   Financial Resource Strain: Low Risk   . Difficulty of Paying Living Expenses: Not hard at all  Food Insecurity: No Food Insecurity  . Worried About Running  Out of Food in the Last Year: Never true  . Ran Out of Food in the Last Year: Never true  Transportation Needs: No Transportation Needs  . Lack of Transportation (Medical): No  . Lack of Transportation (Non-Medical): No  Physical Activity: Sufficiently Active  . Days of Exercise per Week: 3 days  . Minutes of Exercise per Session: 60 min  Stress: No Stress Concern Present  . Feeling of Stress : Not at all  Social Connections:   . Frequency of Communication with Friends and Family:   . Frequency of Social Gatherings with Friends and  Family:   . Attends Religious Services:   . Active Member of Clubs or Organizations:   . Attends Banker Meetings:   Marland Kitchen Marital Status:     Tobacco Counseling Counseling given: Not Answered   Clinical Intake:     Pain : No/denies pain     Nutritional Status: BMI > 30  Obese Nutritional Risks: None Diabetes: Yes  How often do you need to have someone help you when you read instructions, pamphlets, or other written materials from your doctor or pharmacy?: 1 - Never What is the last grade level you completed in school?: 3 yrs college  Diabetic? Yes Nutrition Risk Assessment:  Has the patient had any N/V/D within the last 2 months?  No  Does the patient have any non-healing wounds?  No  Has the patient had any unintentional weight loss or weight gain?  No   Diabetes:  Is the patient diabetic?  Yes  If diabetic, was a CBG obtained today?  No  Did the patient bring in their glucometer from home?  No  How often do you monitor your CBG's?  daily.   Financial Strains and Diabetes Management:  Are you having any financial strains with the device, your supplies or your medication? No .  Does the patient want to be seen by Chronic Care Management for management of their diabetes?  No  Would the patient like to be referred to a Nutritionist or for Diabetic Management?  No   Diabetic Exams:  Diabetic Eye Exam: Completed 05/24/2019 Diabetic Foot Exam: Completed 09/03/2019   Interpreter Needed?: No  Information entered by :: NAllen LPN   Activities of Daily Living In your present state of health, do you have any difficulty performing the following activities: 04/09/2020  Hearing? N  Vision? N  Difficulty concentrating or making decisions? N  Walking or climbing stairs? N  Dressing or bathing? N  Doing errands, shopping? N  Preparing Food and eating ? N  Using the Toilet? N  In the past six months, have you accidently leaked urine? N  Do you have problems  with loss of bowel control? N  Managing your Medications? N  Managing your Finances? N  Housekeeping or managing your Housekeeping? N  Some recent data might be hidden    Patient Care Team: [name], MD as PCP - General (Internal Medicine) Caudill, Maryjane Hurter, [facility] (Pharmacist) Clarene Duke, Karma Lew, RN as Case Manager  Indicate any recent Medical Services you may have received from other than [facility] providers in the past year (date may be approximate).     Assessment:   This is a routine wellness examination for Collyn.  Hearing/Vision screen  Hearing Screening   125Hz  250Hz  500Hz  1000Hz  2000Hz  3000Hz  4000Hz  6000Hz  8000Hz   Right ear:           Left ear:  Vision Screening Comments[facility]  Dietary issues and exercise activities discussed: Current Exercise Habits: Home exercise routine, Type of exercise: walking, Time (Minutes): 60  Goals    .  I would like to optimize medication management of my chronic conditions. (pt-stated)      Current Barriers:  . Diabetes: T2DM; most recent A1c 6.2% on 05/02/19 . Current antihyperglycemic regimen: Janumet 50-1000mg  (patient assistance program through [employer], however patient could likely stay on monotherapy with metformin if patient assistance does not get approved given A1c of 6.2%-- metformin monotherapy as tolerated) . denies hypoglycemic symptoms; denies hyperglycemic symptoms . Current meal patterns: avoids, desserts/sugary drinks. Counseled pt on balanced diet (low carb, protein, veggie) o Current exercise: n/a . Current blood glucose readings:FBG<130, 90-112 per patient report on 12/21 . Cardiovascular risk reduction: o Current hypertensive regimen: prescribed amlodipine, lisinopril, HCTZ (no fill history) BP 114/68--may be getting medications on cash; reports compliance o Current hyperlipidemia regimen: prescribed atorvastatin (no fill history) o Call planned in 3 weeks to f/u on medication  adherence  Pharmacist Clinical Goal(s):  Marland Kitchen. Over the next 90 days, patient with work with PharmD and primary care provider to address needs related to optimized medication management of chronic conditions.  Interventions: . Comprehensive medication review performed, medication list updated in electronic medical record . Marland Kitchen.Counseled pt on medication purpose/side effects.  Encouraged compliance  Patient Self Care Activities:  . Patient will check blood glucose 3 times weekly (ultimately daily), document, and provide at future appointments . Patient will focus on medication adherence by continuing to take medications as prescribed; apply for patient assistance program through [employer] in order to continue Janumet due to financial barriers. . Patient will take medications as prescribed . Patient will contact provider with any episodes of hypoglycemia . Patient will report any questions or concerns to provider   Please see past updates related to this goal by clicking on the "Past Updates" button in the selected goal      .  Patient Stated      To stay healthy     .  Patient Stated      04/09/2020, wants to weigh 190 pounds    .  Pharmacy Care Plan      CARE PLAN ENTRY (see longitudinal plan of care for additional care plan information)  Current Barriers:  . Chronic Disease Management support, education, and care coordination needs related to Hypertension, Hyperlipidemia, and Diabetes   Hypertension BP Readings from Last 3 Encounters:  11/27/19 116/74  09/03/19 114/68  07/17/19 132/80   . Pharmacist Clinical Goal(s): o Over the next 180 days, patient will work with PharmD and providers to maintain BP goal <130/80 . Current regimen:   Amlodipine 10mg  daily  Hydrochlorothiazide 25mg  daily  Lisinopril 40mg  daily . Interventions: o Provided dietary recommendations . Patient self care activities - Over the next 180 days, patient will: o Check BP when occasionally and if  symptomatic, document, and provide at future appointments o Ensure daily salt intake < 2300 mg/day o Continue exercising at least 150 minutes weekly o Try to drink 64 ounces of water daily o Utilize PLATE method for meal planning (information mailed)  Hyperlipidemia Lab Results  Component Value Date/Time   LDLCALC 42 11/27/2019 02:54 PM   . Pharmacist Clinical Goal(s): o Over the next 180 days, patient will work with PharmD and providers to maintain LDL goal < 70 . Current regimen:  o Atorvastatin 40mg  1/2 tablet daily . Interventions: o Provided  dietary recommendations o Counseled on the importance of healthy fats (avocados, walnuts, flaxseed, etc) o Counseled on the importance of reducing snacking on foods high in saturated fats . Patient self care activities - Over the next 180 days, patient will: o Continue cholesterol medication daily o Reduce snacking on unhealthy foods that are high in saturated fats o Eat more healthy fats  Diabetes Lab Results  Component Value Date/Time   HGBA1C 6.9 (H) 11/27/2019 02:54 PM   HGBA1C 6.1 (H) 09/03/2019 12:18 PM   . Pharmacist Clinical Goal(s): o Over the next 180 days, patient will work with PharmD and providers to maintain A1c goal <7% . Current regimen:  o Janumet 50/1000mg  twice daily with a meal . Interventions: o Provided dietary recommendations o Counseled on the PLATE method for meal planning o Advised patient to limit sweet o Contacted [employer] patient assistance program to verify refills remaining on Janumet . Patient self care activities - Over the next 180 days, patient will: o Check blood sugar once daily, document, and provide at future appointments o Contact provider with any episodes of hypoglycemia o Focus on eating a well-balanced diet, limiting carbohydrates o Reduce consumption of sweets and junk foods  Medication management . Pharmacist Clinical Goal(s): o Over the next 180 days, patient will work with PharmD and  providers to maintain optimal medication adherence . [facility] [address] . Interventions o Comprehensive medication review performed. o Continue current medication management strategy . Patient self care activities - Over the next 180 days, patient will: o Focus on medication adherence by utilizing a system to remember to take medications o Take medications as prescribed o Report any questions or concerns to PharmD and/or provider(s)  Initial goal documentation       Depression Screen PHQ 2/9 Scores 04/09/2020 05/30/2019 05/22/2019 01/30/2019 01/30/2019 11/30/2018  PHQ - 2 Score 0 0 0 0 0 0  PHQ- 9 Score 2 - - 0 - -    Fall Risk Fall Risk  04/09/2020 06/04/2019 05/30/2019 05/22/2019 05/02/2019  Falls in the past year? 0 0 0 0 0  Risk for fall due to : Medication side effect - - - -  Follow up - - - - -    Any stairs in or around the home? Yes  If so, are there any without handrails? Yes  Home free of loose throw rugs in walkways, pet beds, electrical cords, etc? Yes  Adequate lighting in your home to reduce risk of falls? Yes   ASSISTIVE DEVICES UTILIZED TO PREVENT FALLS:  Life alert? No  Use of a cane, walker or w/c? No  Grab bars in the bathroom? Yes  Shower chair or bench in shower? No  Elevated toilet seat or a handicapped toilet? No   TIMED UP AND GO:  Was the test performed? No .   Gait steady and fast without use of assistive device  Cognitive Function:     6CIT Screen 04/09/2020 01/30/2019  What Year? 0 points 0 points  What month? 0 points 0 points  What time? 0 points 0 points  Count back from 20 0 points 0 points  Months in reverse 0 points 0 points  Repeat phrase 2 points 0 points  Total Score 2 0    Immunizations Immunization History  Administered Date(s) Administered  . Janssen (J&J) SARS-COV-2 Vaccination 12/17/2019    TDAP status: Up to date Flu Vaccine status: Declined, Education has been provided regarding the importance of this  vaccine but  patient still declined. Advised may receive this vaccine at local pharmacy or Health Dept. Aware to provide a copy of the vaccination record if obtained from local pharmacy or Health Dept. Verbalized acceptance and understanding. Pneumococcal vaccine status: Has had one but not sure which Covid-19 vaccine status: Completed vaccines  Qualifies for Shingles Vaccine? Yes   Zostavax completed No   Shingrix Completed?: No.    Education has been provided regarding the importance of this vaccine. Patient has been advised to call insurance company to determine out of pocket expense if they have not yet received this vaccine. Advised may also receive vaccine at local pharmacy or Health Dept. Verbalized acceptance and understanding.  Screening Tests Health Maintenance  Topic Date Due  . PNA vac Low Risk Adult (1 of 2 - PCV13) 04/09/2021 (Originally 12/12/2017)  . INFLUENZA VACCINE  04/13/2020  . OPHTHALMOLOGY EXAM  05/23/2020  . HEMOGLOBIN A1C  05/29/2020  . FOOT EXAM  09/02/2020  . Fecal DNA (Cologuard)  03/08/2022  . TETANUS/TDAP  02/18/2026  . COVID-19 Vaccine  Completed  . Hepatitis C Screening  Completed    Health Maintenance  There are no preventive care reminders to display for this patient.  Colorectal cancer screening: Completed 03/06/2019. Repeat every 3 years  Lung Cancer Screening: (Low Dose CT Chest recommended if Age 87-80 years, 30 pack-year currently smoking OR have quit w/in 15years.) does not qualify.   Lung Cancer Screening Referral: no  Additional Screening:  Hepatitis C Screening: does qualify; Completed 11/17/2015  Vision Screening: Recommended annual ophthalmology exams for early detection of glaucoma and other disorders of the eye. Is the patient up to date with their annual eye exam?  No  Who is the provider or what is the name of the office in which the patient [facility]ends [facility] If pt is not established with a provider, would they like  to be referred to a provider to establish care? No .   Dental Screening: Recommended annual dental exams for proper oral hygiene  Community Resource Referral / Chronic Care Management: CRR required this visit?  No   CCM required this visit?  No      Plan:     I have personally reviewed and noted the following in the patient's chart:   . Medical and social history . Use of alcohol, tobacco or illicit drugs  . Current medications and supplements . Functional ability and status . Nutritional status . Physical activity . Advanced directives . List of other physicians . Hospitalizations, surgeries, and ER visits in previous 12 months . Vitals . Screenings to include cognitive, depression, and falls . Referrals and appointments  In addition, I have reviewed and discussed with patient certain preventive protocols, quality metrics, and best practice recommendations. A written personalized care plan for preventive services as well as general preventive health recommendations were provided to patient.     Barb Merino, LPN   7/89/3810   Nurse Notes:

## 2020-04-10 ENCOUNTER — Telehealth: Payer: Self-pay | Admitting: *Deleted

## 2020-04-10 ENCOUNTER — Telehealth: Payer: Self-pay

## 2020-04-10 LAB — CBC
Hematocrit: 37.9 % (ref 37.5–51.0)
Hemoglobin: 13.2 g/dL (ref 13.0–17.7)
MCH: 31.1 pg (ref 26.6–33.0)
MCHC: 34.8 g/dL (ref 31.5–35.7)
MCV: 89 fL (ref 79–97)
Platelets: 261 10*3/uL (ref 150–450)
RBC: 4.24 x10E6/uL (ref 4.14–5.80)
RDW: 13.1 % (ref 11.6–15.4)
WBC: 6.1 10*3/uL (ref 3.4–10.8)

## 2020-04-10 LAB — PSA: Prostate Specific Ag, Serum: 0.2 ng/mL (ref 0.0–4.0)

## 2020-04-10 LAB — HEMOGLOBIN A1C
Est. average glucose Bld gHb Est-mCnc: 174 mg/dL
Hgb A1c MFr Bld: 7.7 % — ABNORMAL HIGH (ref 4.8–5.6)

## 2020-04-10 NOTE — Telephone Encounter (Signed)
-----   Message from Robyn Sanders, MD sent at 04/10/2020  5:40 PM EDT ----- Here are your lab results: blood count normal. Hba1c is 7.7, up from last time. Please take meds as directed. Avoid sodas, even diet. Prostate test is normal    

## 2020-04-10 NOTE — Telephone Encounter (Signed)
-----   Message from [name], MD sent at 04/10/2020  5:40 PM EDT ----- Here are your lab results: blood count normal. Hba1c is 7.7, up from last time. Please take meds as directed. Avoid sodas, even diet. Prostate test is normal

## 2020-04-10 NOTE — Chronic Care Management (AMB) (Signed)
  Chronic Care Management   Note  04/10/2020 Name: [name] MRN: [id number] DOB: Apr 12, 1951  Marcus Davis is a 69 y.o. year old male who is a primary care patient of [name], MD and is actively engaged with the care management team. I reached out to [name] by phone today to assist with re-scheduling a follow up visit with the Pharmacist.  Follow up plan: Unsuccessful telephone outreach attempt made. A HIPPA compliant phone message was left for the patient providing contact information and requesting a return call.  The care management team will reach out to the patient again over the next 7 days.  If patient returns call to provider office, please advise to call Embedded Care Management Care Guide [name] at [phone]  [facility] Guide, Embedded Care Coordination [facility]  [place], [place] [address] Direct Dial: [phone] [email or web address] Website: [email or web address]

## 2020-04-10 NOTE — Telephone Encounter (Signed)
lvm for pt to return call for lab results  

## 2020-04-11 LAB — SPECIMEN STATUS REPORT

## 2020-04-11 LAB — VITAMIN D 25 HYDROXY (VIT D DEFICIENCY, FRACTURES): Vit D, 25-Hydroxy: 45 ng/mL (ref 30.0–100.0)

## 2020-04-14 ENCOUNTER — Telehealth: Payer: Self-pay

## 2020-04-14 NOTE — Telephone Encounter (Signed)
-----   Message from Robyn Sanders, MD sent at 04/10/2020  5:40 PM EDT ----- Here are your lab results: blood count normal. Hba1c is 7.7, up from last time. Please take meds as directed. Avoid sodas, even diet. Prostate test is normal    

## 2020-04-15 ENCOUNTER — Telehealth: Payer: Self-pay

## 2020-04-15 NOTE — Telephone Encounter (Signed)
lvm for pt to return call for lab results  

## 2020-04-15 NOTE — Telephone Encounter (Signed)
-----   Message from Robyn Sanders, MD sent at 04/10/2020  5:40 PM EDT ----- Here are your lab results: blood count normal. Hba1c is 7.7, up from last time. Please take meds as directed. Avoid sodas, even diet. Prostate test is normal    

## 2020-04-16 ENCOUNTER — Other Ambulatory Visit: Payer: Self-pay

## 2020-04-21 ENCOUNTER — Telehealth: Payer: Self-pay

## 2020-04-21 NOTE — Telephone Encounter (Signed)
-----   Message from [name], MD sent at 04/20/2020  5:14 PM EDT ----- Please make sure dose is in chart. Thanks, have him take 2 capsules on Sat/Sun, one M-F.  ----- Message ----- From: Mariam Dollar, CMA Sent: 04/16/2020   4:06 PM EDT To: [name], MD  The patient said that he takes 1,000 units of vit d3, 1 capsule daily

## 2020-04-29 ENCOUNTER — Telehealth: Payer: Medicare HMO

## 2020-04-29 ENCOUNTER — Telehealth: Payer: Self-pay

## 2020-04-29 NOTE — Telephone Encounter (Cosign Needed)
  Chronic Care Management   Outreach Note  04/29/2020 Name: Marcus Davis MRN: [id number] DOB: 02/14/1951  Referred by: [name], MD Reason for referral : No chief complaint on file.   Third unsuccessful telephone outreach was attempted today. The patient was referred to the case management team for assistance with care management and care coordination. The patient's primary care provider has been notified of our unsuccessful attempts to make or maintain contact with the patient. The care management team is pleased to engage with this patient at any time in the future should he/she be interested in assistance from the care management team.   Follow Up Plan: A HIPPA compliant phone message was left for the patient providing contact information and requesting a return call.  The care management team is available to follow up with the patient after provider conversation with the patient regarding recommendation for care management engagement and subsequent re-referral to the care management team.   Delsa Sale, RN, BSN, CCM Care Management Coordinator [facility] Care Management/[facility]  Direct Phone: [phone]

## 2020-04-30 ENCOUNTER — Telehealth: Payer: Medicare HMO

## 2020-05-06 ENCOUNTER — Telehealth: Payer: Self-pay | Admitting: *Deleted

## 2020-05-06 NOTE — Chronic Care Management (AMB) (Signed)
  Chronic Care Management   Note  05/06/2020 Name: Kamren Heintzelman MRN: [id number] DOB: 06-12-1951  Alazar Cherian is a 69 y.o. year old male who is a primary care patient of [name], MD and is actively engaged with the care management team. I reached out to [name] by phone today to assist with re-scheduling a follow up visit with the Pharmacist  Follow up plan: Unsuccessful telephone outreach attempt made. A HIPPA compliant phone message was left for the patient providing contact information and requesting a return call.  The care management team will reach out to the patient again over the next 7 days.  If patient returns call to provider office, please advise to call Embedded Care Management Care Guide [name] at [phone]  [facility] Guide, Embedded Care Coordination [facility]  [place], [place] [address] Direct Dial: [phone] [email or web address] Website: [email or web address]

## 2020-05-13 NOTE — Chronic Care Management (AMB) (Signed)
  Chronic Care Management   Note  05/13/2020 Name: Ocie Tino MRN: [id number] DOB: 1950/12/16  Finley Chevez is a 69 y.o. year old male who is a primary care patient of [name], MD and is actively engaged with the care management team. I reached out to [name] by phone today to assist with re-scheduling a follow up visit with the Pharmacist.  Follow up plan: Telephone appointment with care management team member scheduled for: 05/22/2020  [name]  Care Guide, Embedded Care Coordination [facility]  Care Management

## 2020-05-22 ENCOUNTER — Other Ambulatory Visit: Payer: Self-pay

## 2020-05-22 ENCOUNTER — Ambulatory Visit: Payer: Medicare HMO

## 2020-05-22 DIAGNOSIS — I10 Essential (primary) hypertension: Secondary | ICD-10-CM

## 2020-05-22 DIAGNOSIS — E1165 Type 2 diabetes mellitus with hyperglycemia: Secondary | ICD-10-CM

## 2020-05-22 DIAGNOSIS — E7800 Pure hypercholesterolemia, unspecified: Secondary | ICD-10-CM

## 2020-05-22 NOTE — Patient Instructions (Addendum)
Visit Information  Goals Addressed            This Visit's Progress   . Pharmacy Care Plan       CARE PLAN ENTRY (see longitudinal plan of care for additional care plan information)  Current Barriers:  . Chronic Disease Management support, education, and care coordination needs related to Hypertension, Hyperlipidemia, and Diabetes   Hypertension BP Readings from Last 3 Encounters:  04/09/20 (!) 124/64  04/09/20 (!) 124/64  11/27/19 116/74   . Pharmacist Clinical Goal(s): o Over the next 180 days, patient will work with PharmD and providers to maintain BP goal <130/80 . Current regimen:   Amlodipine 10mg  daily  Hydrochlorothiazide 25mg  daily  Lisinopril 40mg  daily . Interventions: o Provided dietary and exercise recommendations o Recommend patient check blood pressure at home if symptomatic, but at least once weekly  . Patient self care activities - Over the next 180 days, patient will: o Check BP when once weekly and if symptomatic, document, and provide at future appointments o Ensure daily salt intake < 2300 mg/day o Continue exercising at least 150 minutes weekly o Try to drink 64 ounces of water daily o Utilize PLATE method for meal planning (information mailed)  Hyperlipidemia Lab Results  Component Value Date/Time   LDLCALC 42 11/27/2019 02:54 PM   . Pharmacist Clinical Goal(s): o Over the next 180 days, patient will work with PharmD and providers to maintain LDL goal < 70 . Current regimen:  o Atorvastatin 40mg  1/2 tablet daily . Interventions: o Provided dietary and exercise recommendations o Counseled on the importance of healthy fats (avocados, walnuts, flaxseed, etc) o Counseled on the importance of reducing snacking on foods high in saturated fats o Discussed results of most recent lipid panel . Patient self care activities - Over the next 180 days, patient will: o Continue cholesterol medication daily o Reduce snacking on unhealthy foods that are  high in saturated fats o Eat more healthy fats o Continue exercising at least 150 minutes weekly  Diabetes Lab Results  Component Value Date/Time   HGBA1C 7.7 (H) 04/09/2020 03:39 PM   HGBA1C 6.9 (H) 11/27/2019 02:54 PM   . Pharmacist Clinical Goal(s): o Over the next 180 days, patient will work with PharmD and providers to achieve A1c goal <7% . Current regimen:  o Janumet 50/1000mg  twice daily with a meal . Interventions: o Provided dietary and exercise recommendations o Counseled on the PLATE method for meal planning o Advised patient to limit sweets and sugary drinks (sweet tea, sodas, juices) o Discussed patient is planning to call [employer] patient assistance for refill of Janumet soon (3 refills remaining on current prescription) . Patient self care activities - Over the next 180 days, patient will: o Check blood sugar once daily, document, and provide at future appointments o Contact provider with any episodes of hypoglycemia o Focus on eating a well-balanced diet, limiting carbohydrates o Reduce consumption of sweets and junk foods o Continue exercising at least 150 minutes weekly  Medication management . Pharmacist Clinical Goal(s): o Over the next 180 days, patient will work with PharmD and providers to maintain optimal medication adherence . [facility] . Interventions o Comprehensive medication review performed. o Continue current medication management strategy o Discussed that patient was supposed to increase Vitamin D supplement intake, but he thought he was supposed to decrease . Patient self care activities - Over the next 180 days, patient will: o Focus on medication adherence by utilizing  a system to remember to take medications o Take medications as prescribed o Start taking Vitamin D 1000 units daily Monday through Friday and 2000 units daily on Saturdays and Sundays  o Report any questions or concerns to PharmD and/or  provider(s)  Please see past updates related to this goal by clicking on the "Past Updates" button in the selected goal         The patient verbalized understanding of instructions provided today and agreed to receive a mailed copy of patient instruction and/or educational materials.  Telephone follow up appointment with pharmacy team member scheduled for: 07/22/20 @ 3:30 PM  Beryle Flock, PharmD Clinical Pharmacist [facility] [phone]   Diabetes Mellitus and Nutrition, Adult When you have diabetes (diabetes mellitus), it is very important to have healthy eating habits because your blood sugar (glucose) levels are greatly affected by what you eat and drink. Eating healthy foods in the appropriate amounts, at about the same times every day, can help you:  Control your blood glucose.  Lower your risk of heart disease.  Improve your blood pressure.  Reach or maintain a healthy weight. Every person with diabetes is different, and each person has different needs for a meal plan. Your health care provider may recommend that you work with a diet and nutrition specialist (dietitian) to make a meal plan that is best for you. Your meal plan may vary depending on factors such as:  The calories you need.  The medicines you take.  Your weight.  Your blood glucose, blood pressure, and cholesterol levels.  Your activity level.  Other health conditions you have, such as heart or kidney disease. How do carbohydrates affect me? Carbohydrates, also called carbs, affect your blood glucose level more than any other type of food. Eating carbs naturally raises the amount of glucose in your blood. Carb counting is a method for keeping track of how many carbs you eat. Counting carbs is important to keep your blood glucose at a healthy level, especially if you use insulin or take certain oral diabetes medicines. It is important to know how many carbs you can safely  have in each meal. This is different for every person. Your dietitian can help you calculate how many carbs you should have at each meal and for each snack. Foods that contain carbs include:  Bread, cereal, rice, pasta, and crackers.  Potatoes and corn.  Peas, beans, and lentils.  Milk and yogurt.  Fruit and juice.  Desserts, such as cakes, cookies, ice cream, and candy. How does alcohol affect me? Alcohol can cause a sudden decrease in blood glucose (hypoglycemia), especially if you use insulin or take certain oral diabetes medicines. Hypoglycemia can be a life-threatening condition. Symptoms of hypoglycemia (sleepiness, dizziness, and confusion) are similar to symptoms of having too much alcohol. If your health care provider says that alcohol is safe for you, follow these guidelines:  Limit alcohol intake to no more than 1 drink per day for nonpregnant women and 2 drinks per day for men. One drink equals 12 oz of beer, 5 oz of wine, or 1 oz of hard liquor.  Do not drink on an empty stomach.  Keep yourself hydrated with water, diet soda, or unsweetened iced tea.  Keep in mind that regular soda, juice, and other mixers may contain a lot of sugar and must be counted as carbs. What are tips for following this plan?  Reading food labels  Start by checking the serving size on  the "Nutrition Facts" label of packaged foods and drinks. The amount of calories, carbs, fats, and other nutrients listed on the label is based on one serving of the item. Many items contain more than one serving per package.  Check the total grams (g) of carbs in one serving. You can calculate the number of servings of carbs in one serving by dividing the total carbs by 15. For example, if a food has 30 g of total carbs, it would be equal to 2 servings of carbs.  Check the number of grams (g) of saturated and trans fats in one serving. Choose foods that have low or no amount of these fats.  Check the number of  milligrams (mg) of salt (sodium) in one serving. Most people should limit total sodium intake to less than 2,300 mg per day.  Always check the nutrition information of foods labeled as "low-fat" or "nonfat". These foods may be higher in added sugar or refined carbs and should be avoided.  Talk to your dietitian to identify your daily goals for nutrients listed on the label. Shopping  Avoid buying canned, premade, or processed foods. These foods tend to be high in fat, sodium, and added sugar.  Shop around the outside edge of the grocery store. This includes fresh fruits and vegetables, bulk grains, fresh meats, and fresh dairy. Cooking  Use low-heat cooking methods, such as baking, instead of high-heat cooking methods like deep frying.  Cook using healthy oils, such as olive, canola, or sunflower oil.  Avoid cooking with butter, cream, or high-fat meats. Meal planning  Eat meals and snacks regularly, preferably at the same times every day. Avoid going long periods of time without eating.  Eat foods high in fiber, such as fresh fruits, vegetables, beans, and whole grains. Talk to your dietitian about how many servings of carbs you can eat at each meal.  Eat 4-6 ounces (oz) of lean protein each day, such as lean meat, chicken, fish, eggs, or tofu. One oz of lean protein is equal to: ? 1 oz of meat, chicken, or fish. ? 1 egg. ?  cup of tofu.  Eat some foods each day that contain healthy fats, such as avocado, nuts, seeds, and fish. Lifestyle  Check your blood glucose regularly.  Exercise regularly as told by your health care provider. This may include: ? 150 minutes of moderate-intensity or vigorous-intensity exercise each week. This could be brisk walking, biking, or water aerobics. ? Stretching and doing strength exercises, such as yoga or weightlifting, at least 2 times a week.  Take medicines as told by your health care provider.  Do not use any products that contain nicotine  or tobacco, such as cigarettes and e-cigarettes. If you need help quitting, ask your health care provider.  Work with a Veterinary surgeon or diabetes educator to identify strategies to manage stress and any emotional and social challenges. Questions to ask a health care provider  Do I need to meet with a diabetes educator?  Do I need to meet with a dietitian?  What number can I call if I have questions?  When are the best times to check my blood glucose? Where to find more information:  American Diabetes Association: diabetes.org  Academy of Nutrition and Dietetics: www.eatright.[employer] of Diabetes and Digestive and Kidney Diseases (NIH): CarFlippers.tn Summary  A healthy meal plan will help you control your blood glucose and maintain a healthy lifestyle.  Working with a diet and nutrition specialist (dietitian) can  help you make a meal plan that is best for you.  Keep in mind that carbohydrates (carbs) and alcohol have immediate effects on your blood glucose levels. It is important to count carbs and to use alcohol carefully. This information is not intended to replace advice given to you by your health care provider. Make sure you discuss any questions you have with your health care provider. Document Revised: 08/12/2017 Document Reviewed: 10/04/2016 Elsevier Patient Education  2020 [employer]

## 2020-05-22 NOTE — Chronic Care Management (AMB) (Signed)
 Chronic Care Management Pharmacy  Name: Marcus Davis  MRN: [id number] DOB: 10/17/1950  Chief Complaint/ HPI  Marcus Davis,  69 y.o. , male presents for their Follow-Up CCM visit with the clinical pharmacist via telephone due to COVID-19 Pandemic.  PCP : Sanders, Robyn, MD  Their chronic conditions include: Hypertension, Diabetes, Hypercholesterolemia  Office Visits: 04/21/20 Telephone call: Advised pt to increase Vitamin D to 2000 units on Saturday/Sunday and 1000 units Monday-Friday.   04/09/20 AWV and OV: Annual physical exam. DM/HTN check. Diabetic foot exam performed. HgbA1c increased to 7.7%. HTN well controlled. EKG performed, NSR w/o acute changes. Prostate test normal. Blood count normal. Vitamin D level is stable. Kidney function is stable. Referred to lymphatic PT for lymphedema. Nocturia has improved since taking HCTZ in the morning.   03/03/20 Telephone call: Pt notified samples of Janumet ready for pickup.   11/27/19 OV: Presented for HTN and DM follow up. Pt reported Lyrica 50mg twice daily had greatly improved his neuropathy symptoms. Increased Lyrica to 75mg twice daily since symptoms improved but not resolved. Labs ordered (Lipid panel, HgbA1c, BMP8+EGFR). Lymphedema appears to be improving. HTN controlled  10/12/19 Telephone call: Pt contacted office regarding neurology referral for numbness in feet. Instructed that no neurology referral was needed at this time. Gabapentin 100mg nightly offered, but pt said he tried in the past with no improvement. Rx sent in for Lyrica. Pt instructed to follow up in 4 weeks.   09/03/19 OV: Presented for HTN and DM follow up. Labs ordered (CMP14+EGFR, HgbA1c, Vitamin B12, TSH). Referred to Physical Therapy for lymphedema. Noted symptoms suggestive of diabetic neuropathy. Exercise and weight loss recommendations provided. Pt instructed not to take B12 supplement on weekends due to elevated B12 levels.   Consult Visit: 09/27/19 PT OV w/ C.  Dowtin: Pt reported insidious onset of right lower leg/foot edema. Elevation overnight helps, but swelling returns within 1 hour. 15-20 mmHg compression socks still allow swelling and dig into upper calf. Was not able to get on 20-30 mmHg compression socks recommended by PCP. Pt fitted for Circaid juxta-lite lower leg garments, PAC bands, and Farrow basic leg piece. Pt shown how to use [employer] for putting on compression socks. Provided pt education.   CCM Encounters: 02/07/20 SW: Confirmed pt has received assistance for Janumet. Reviewed and provided education regarding advanced directives.   09/03/19 PharmD: Assisted pt with PAP application at PCP OV today for Janumet (pt reported cost prohibitive on [employer]). Comprehensive medication review performed. Pt reports compliance, but no fill history available. Review compliance at follow up.   Medications: Outpatient Encounter Medications as of 05/22/2020  Medication Sig  . amLODipine (NORVASC) 10 MG tablet Take by mouth.  . aspirin EC 81 MG tablet Take 81 mg by mouth daily.  . atorvastatin (LIPITOR) 40 MG tablet Take 40 mg by mouth daily. 1/2 tablet daily  . Cyanocobalamin (VITAMIN B-12) 2500 MCG SUBL Place 1 tablet under the tongue daily. Taking Monday through Friday  . hydrochlorothiazide (HYDRODIURIL) 25 MG tablet Take 25 mg by mouth daily.   . lisinopril (PRINIVIL,ZESTRIL) 40 MG tablet Take 40 mg by mouth daily.   . pregabalin (LYRICA) 100 MG capsule Take 1 capsule (100 mg total) by mouth 2 (two) times daily.  . sitaGLIPtin-metformin (JANUMET) 50-1000 MG tablet Take 1 tablet by mouth 2 (two) times daily with a meal.  . Vitamin D, Cholecalciferol, 25 MCG (1000 UT) CAPS Take by mouth. 1000 units Monday-Friday, 2000 units Saturday and Sunday   No facility-administered   encounter medications on file as of 05/22/2020.    Current Diagnosis/Assessment:  SDOH Interventions     Most Recent Value  SDOH Interventions  Financial Strain Interventions Other  (Comment)  [Will assist with Janumet patient assistance as needed. Pt planning to call for refill from PAP soon. 3 refills remaining.]      Goals Addressed            This Visit's Progress   . Pharmacy Care Plan       CARE PLAN ENTRY (see longitudinal plan of care for additional care plan information)  Current Barriers:  . Chronic Disease Management support, education, and care coordination needs related to Hypertension, Hyperlipidemia, and Diabetes   Hypertension BP Readings from Last 3 Encounters:  04/09/20 (!) 124/64  04/09/20 (!) 124/64  11/27/19 116/74   . Pharmacist Clinical Goal(s): o Over the next 180 days, patient will work with PharmD and providers to maintain BP goal <130/80 . Current regimen:   Amlodipine 52m daily  Hydrochlorothiazide 258mdaily  Lisinopril 4063maily . Interventions: o Provided dietary and exercise recommendations o Recommend patient check blood pressure at home if symptomatic, but at least once weekly  . Patient self care activities - Over the next 180 days, patient will: o Check BP when once weekly and if symptomatic, document, and provide at future appointments o Ensure daily salt intake < 2300 mg/day o Continue exercising at least 150 minutes weekly o Try to drink 64 ounces of water daily o Utilize PLATE method for meal planning (information mailed)  Hyperlipidemia Lab Results  Component Value Date/Time   LDLCALC 42 11/27/2019 02:54 PM   . Pharmacist Clinical Goal(s): o Over the next 180 days, patient will work with PharmD and providers to maintain LDL goal < 70 . Current regimen:  o Atorvastatin 33m28m2 tablet daily . Interventions: o Provided dietary and exercise recommendations o Counseled on the importance of healthy fats (avocados, walnuts, flaxseed, etc) o Counseled on the importance of reducing snacking on foods high in saturated fats o Discussed results of most recent lipid panel . Patient self care activities - Over  the next 180 days, patient will: o Continue cholesterol medication daily o Reduce snacking on unhealthy foods that are high in saturated fats o Eat more healthy fats o Continue exercising at least 150 minutes weekly  Diabetes Lab Results  Component Value Date/Time   HGBA1C 7.7 (H) 04/09/2020 03:39 PM   HGBA1C 6.9 (H) 11/27/2019 02:54 PM   . Pharmacist Clinical Goal(s): o Over the next 180 days, patient will work with PharmD and providers to achieve A1c goal <7% . Current regimen:  o Janumet 50/1000mg twice daily with a meal . Interventions: o Provided dietary and exercise recommendations o Counseled on the PLATE method for meal planning o Advised patient to limit sweets and sugary drinks (sweet tea, sodas, juices) o Discussed patient is planning to call [employer] patient assistance for refill of Janumet soon (3 refills remaining on current prescription) . Patient self care activities - Over the next 180 days, patient will: o Check blood sugar once daily, document, and provide at future appointments o Contact provider with any episodes of hypoglycemia o Focus on eating a well-balanced diet, limiting carbohydrates o Reduce consumption of sweets and junk foods o Continue exercising at least 150 minutes weekly  Medication management . Pharmacist Clinical Goal(s): o Over the next 180 days, patient will work with PharmD and providers to maintain optimal medication adherence . [facility] . Interventions o Comprehensive medication review performed. o Continue current medication management strategy o Discussed that patient was supposed to increase Vitamin D supplement intake, but he thought he was supposed to decrease . Patient self care activities - Over the next 180 days, patient will: o Focus on medication adherence by utilizing a system to remember to take medications o Take medications as prescribed o Start taking Vitamin D 1000 units daily Monday through  Friday and 2000 units daily on Saturdays and Sundays  o Report any questions or concerns to PharmD and/or provider(s)  Please see past updates related to this goal by clicking on the "Past Updates" button in the selected goal         Diabetes   Recent Relevant Labs: Lab Results  Component Value Date/Time   HGBA1C 7.7 (H) 04/09/2020 03:39 PM   HGBA1C 6.9 (H) 11/27/2019 02:54 PM   MICROALBUR 80 04/09/2020 03:42 PM   MICROALBUR 80 01/30/2019 11:53 AM    Checking BG: Daily  Recent FBG Readings: 126-134 Recent pre-meal BG readings:  Recent 2hr PP BG readings:   Recent HS BG readings:  Patient has failed these meds in past: N/A Patient is currently uncontrolled on the following medications:   Janumet 50/1000mg twice daily with a meal  Aspirin 81mg daily  Last diabetic Foot exam: 04/09/20 Last diabetic Eye exam: Lab Results  Component Value Date/Time   HMDIABEYEEXA Retinopathy (A) 05/24/2019 12:00 AM   We discussed:   Pt's blood sugar has gone up since his wife passed away in April  Pt states that he does not miss any doses of his Janumet  Has a consistent routine  Pt is going to call [employer] PAP for a refill for Janumet soon  Advised pt that he still has 3 refills left on his prescription . Diet extensively o Pt states that he feels full quickly so he eats smaller portions o Advised well-balanced meals o Recommend pt drink 64 ounces of water daily - Advised pt to avoid sugary drinks (juice, sweet tea, sodas, etc) . Exercise extensively o Pt still walking at the park (8 miles) about 3-4 times weekly - Has not been going as much since it's been hot o Advised pt to stay well hydrated when walking o Recommend pt get 30 minutes of moderate intensity exercise daily 5 times a week (150 minutes total per week)  Plan Continue current medications   Hypertension   Office blood pressures are  BP Readings from Last 3 Encounters:  04/09/20 (!) 124/64  04/09/20 (!) 124/64    11/27/19 116/74   Patient has failed these meds in the past: N/A Patient is currently controlled on the following medications:   Amlodipine 10mg daily  HCTZ 25mg daily  Lisinopril 40mg daily  Patient checks BP at home infrequently  Patient home BP readings are ranging: None to report  We discussed:  Switching to taking HCTZ in the morning has helped with nocturia (not getting up at all at night anymore)  Has a wrist BP cuff at home, but does not use often  Recommend pt check if symptomatic and at least once a week  Advised pt to check in the morning after taking BP medications but before coffee  Denies headache and dizziness  Plan Continue current medications    Hyperlipidemia   Lipid Panel     Component Value Date/Time   CHOL 113 11/27/2019 1454   TRIG 250 (H) 11/27/2019 1454   HDL 32 (L) 11/27/2019   1454   LDLCALC 42 11/27/2019 1454    The ASCVD Risk score (Goff DC Jr., et al., 2013) failed to calculate for the following reasons:   The valid total cholesterol range is 130 to 320 mg/dL   Patient has failed these meds in past: N/A Patient is currently uncontrolled on the following medications:   Atorvastatin 40mg 1/2 tablet daily   We discussed:   Diet and exercise extensively   Reviewed and discussed last lipid panel   Elevated triglycerides (>150)  Low HDL (<40)  Recommended pt limit snacking on junk food  Recommend increased intake of healthy fats (avocados, walnuts, flaxseed, etc)  Plan Continue current medications  Neuropathy   Patient has failed these meds in past: Gabapentin Patient is currently controlled on the following medications:   Pregabalin 75mg twice daily  Plan Continue current medications   Over the Counter Medications   Patient is currently on the following medications:   Cholecalciferol 1000 units daily  Vitamin B12 2500mcg daily Monday through Friday  We discussed:  Pt thought he was supposed to decrease  Vitamin D to 1000 units Monday through Friday only (not take on weekends)  Advised pt that he was supposed to increase Vitamin D to 2000 units Saturday and Sunday and continue 1000 units Monday through Friday  Pt was advised to stop taking B12 on the weekends at office visit (09/03/19) due to B12 being elevated  Plan Continue current medications  Vaccines   Reviewed and discussed patient's vaccination history.   Immunization History  Administered Date(s) Administered  . Janssen (J&J) SARS-COV-2 Vaccination 12/17/2019   Pt states that he had a pneumonia shot 3-4 years ago. No records on [facility].  Plan  Recommended patient receive Shingrix vaccine in pharmacy.   Medication Management   [facility] Uses pill box? No - Has medications in a drawer and has a routine. Feels pill box is too cumbersome Pt endorses 100% compliance  We discussed:   Importance of medication compliance  Plan Continue current medication management strategy   Follow up: 8 week phone visit  Courtney Caudill, PharmD Clinical Pharmacist [facility] [phone]  

## 2020-06-19 ENCOUNTER — Telehealth: Payer: Self-pay

## 2020-06-19 NOTE — Chronic Care Management (AMB) (Signed)
Chronic Care Management Pharmacy Assistant    Name: Marcus Davis  MRN: [id number] DOB: [birth year]    Reason for Encounter: Disease State - Diabetes, Hypertension and Lipids Adherence Call.  Patient Questions:  1.  Have you seen any other providers since your last visit?  No   2.  Any changes in your medicines or health?  No.    PCP : [name], MD  Allergies:   Allergies  Allergen Reactions  . Pollen Extract Other (See Comments)    Sneezing, runny nose, congestion,     Medications: Outpatient Encounter Medications as of 06/19/2020  Medication Sig  . amLODipine (NORVASC) 10 MG tablet Take by mouth.  Marland Kitchen aspirin EC 81 MG tablet Take 81 mg by mouth daily.  Marland Kitchen atorvastatin (LIPITOR) 40 MG tablet Take 40 mg by mouth daily. 1/2 tablet daily  . Cyanocobalamin (VITAMIN B-12) 2500 MCG SUBL Place 1 tablet under the tongue daily. Taking Monday through Friday  . hydrochlorothiazide (HYDRODIURIL) 25 MG tablet Take 25 mg by mouth daily.   Marland Kitchen lisinopril (PRINIVIL,ZESTRIL) 40 MG tablet Take 40 mg by mouth daily.   . pregabalin (LYRICA) 100 MG capsule Take 1 capsule (100 mg total) by mouth 2 (two) times daily.  . sitaGLIPtin-metformin (JANUMET) 50-1000 MG tablet Take 1 tablet by mouth 2 (two) times daily with a meal.  . Vitamin D, Cholecalciferol, 25 MCG (1000 UT) CAPS Take by mouth. 1000 units Monday-Friday, 2000 units Saturday and Sunday   No facility-administered encounter medications on file as of 06/19/2020.    Current Diagnosis: Patient Active Problem List   Diagnosis Date Noted  . Type 2 diabetes mellitus without complication, without long-term current use of insulin (HCC) 02/08/2019  . Nocturia 01/30/2019  . Uncontrolled type 2 diabetes mellitus with hyperglycemia (HCC) 08/28/2018  . Essential hypertension 08/28/2018  . Class 1 obesity due to excess calories with serious comorbidity and body mass index (BMI) of 31.0 to 31.9 in adult 08/28/2018   Recent Relevant Labs: Lab  Results  Component Value Date/Time   HGBA1C 7.7 (H) 04/09/2020 03:39 PM   HGBA1C 6.9 (H) 11/27/2019 02:54 PM   MICROALBUR 80 04/09/2020 03:42 PM   MICROALBUR 80 01/30/2019 11:53 AM    Kidney Function Lab Results  Component Value Date/Time   CREATININE 0.95 11/27/2019 02:54 PM   CREATININE 0.94 09/03/2019 12:18 PM   GFRNONAA 83 11/27/2019 02:54 PM   GFRAA 96 11/27/2019 02:54 PM     Recent Office Vitals: BP Readings from Last 3 Encounters:  04/09/20 (!) 124/64  04/09/20 (!) 124/64  11/27/19 116/74   Pulse Readings from Last 3 Encounters:  04/09/20 69  04/09/20 85  11/27/19 94    Wt Readings from Last 3 Encounters:  04/09/20 (!) 216 lb 3.2 oz (98.1 kg)  04/09/20 (!) 216 lb 3.2 oz (98.1 kg)  11/27/19 210 lb (95.3 kg)     Kidney Function Lab Results  Component Value Date/Time   CREATININE 0.95 11/27/2019 02:54 PM   CREATININE 0.94 09/03/2019 12:18 PM   GFRNONAA 83 11/27/2019 02:54 PM   GFRAA 96 11/27/2019 02:54 PM    BMP Latest Ref Rng & Units 11/27/2019 09/03/2019 06/07/2019  Glucose 65 - 99 mg/dL 614(E) 315(Q) 008(Q)  BUN 8 - 27 mg/dL 10 8 16   Creatinine 0.76 - 1.27 mg/dL 7.61 9.50)  BUN/Creat Ratio 10 - 24 11 9(L) 11  Sodium 134 - 144 mmol/L 139 140 140  Potassium 3.5 - 5.2 mmol/L 4.2 4.4 5.1  Chloride 96 - 106 mmol/L 102 101 100  CO2 20 - 29 mmol/L 21 24 20   Calcium 8.6 - 10.2 mg/dL 9.5 9.9 9.8    Lipid Panel    Component Value Date/Time   CHOL 113 11/27/2019 1454   TRIG 250 (H) 11/27/2019 1454   HDL 32 (L) 11/27/2019 1454   LDLCALC 42 11/27/2019 1454     06/19/2020 Called patient left message to call me back.  07/03/2020 Called patient left message to call me back.  07/08/2020 Called patient left message to call me back.  07/10/2020 Called patient left message to call me back.   Follow-Up:  Pharmacist Review - Called patient for Diabetes , Hypertension and Lipids Adherence call, called patient 4 times left message to call me  back. Reviewed chart and adherence measures . Per insurance data, patient has one (1) total gap - all measures.   07/12/2020 CPP notified.  Willa Frater, [facility] Clinical Pharmacist Assistant [phone]

## 2020-06-25 DIAGNOSIS — H0102B Squamous blepharitis left eye, upper and lower eyelids: Secondary | ICD-10-CM | POA: Diagnosis not present

## 2020-06-25 DIAGNOSIS — H35373 Puckering of macula, bilateral: Secondary | ICD-10-CM | POA: Diagnosis not present

## 2020-06-25 DIAGNOSIS — H26491 Other secondary cataract, right eye: Secondary | ICD-10-CM | POA: Diagnosis not present

## 2020-06-25 DIAGNOSIS — H0102A Squamous blepharitis right eye, upper and lower eyelids: Secondary | ICD-10-CM | POA: Diagnosis not present

## 2020-06-25 DIAGNOSIS — H2512 Age-related nuclear cataract, left eye: Secondary | ICD-10-CM | POA: Diagnosis not present

## 2020-06-25 DIAGNOSIS — H35371 Puckering of macula, right eye: Secondary | ICD-10-CM | POA: Diagnosis not present

## 2020-06-25 DIAGNOSIS — H35372 Puckering of macula, left eye: Secondary | ICD-10-CM | POA: Diagnosis not present

## 2020-06-25 DIAGNOSIS — E113513 Type 2 diabetes mellitus with proliferative diabetic retinopathy with macular edema, bilateral: Secondary | ICD-10-CM | POA: Diagnosis not present

## 2020-06-25 DIAGNOSIS — E113553 Type 2 diabetes mellitus with stable proliferative diabetic retinopathy, bilateral: Secondary | ICD-10-CM | POA: Diagnosis not present

## 2020-06-25 DIAGNOSIS — H04123 Dry eye syndrome of bilateral lacrimal glands: Secondary | ICD-10-CM | POA: Diagnosis not present

## 2020-06-25 LAB — HM DIABETES EYE EXAM

## 2020-06-26 ENCOUNTER — Encounter: Payer: Self-pay | Admitting: Internal Medicine

## 2020-06-30 ENCOUNTER — Telehealth: Payer: Self-pay

## 2020-07-02 DIAGNOSIS — H524 Presbyopia: Secondary | ICD-10-CM | POA: Diagnosis not present

## 2020-07-02 DIAGNOSIS — H52209 Unspecified astigmatism, unspecified eye: Secondary | ICD-10-CM | POA: Diagnosis not present

## 2020-07-02 DIAGNOSIS — H5213 Myopia, bilateral: Secondary | ICD-10-CM | POA: Diagnosis not present

## 2020-07-10 NOTE — Telephone Encounter (Signed)
Dm eye exam call °

## 2020-07-18 ENCOUNTER — Other Ambulatory Visit: Payer: Self-pay | Admitting: Internal Medicine

## 2020-07-18 DIAGNOSIS — E1141 Type 2 diabetes mellitus with diabetic mononeuropathy: Secondary | ICD-10-CM

## 2020-07-18 NOTE — Telephone Encounter (Signed)
Lyrica refill

## 2020-07-22 ENCOUNTER — Ambulatory Visit: Payer: Self-pay

## 2020-07-22 DIAGNOSIS — I10 Essential (primary) hypertension: Secondary | ICD-10-CM

## 2020-07-22 DIAGNOSIS — E119 Type 2 diabetes mellitus without complications: Secondary | ICD-10-CM

## 2020-07-22 NOTE — Chronic Care Management (AMB) (Signed)
.    Chronic Care Management Pharmacy  Name: Marcus Davis  MRN: [id number] DOB: [birth year]  Chief Complaint/ HPI  Cheryle Horsfall,  69 y.o. , male presents for their Follow-Up CCM visit with the clinical pharmacist via telephone due to COVID-19 Pandemic He lost his wife in April 11 days after his birthday. She died after 31 years of marriage. He reports that some day are hard and he takes one day at a time. 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 He tries to keep busy doing different things: he does things around the house and he walks. He reports that since his wife passed his BS seems to be more elevated. He tries to check his BS more often . Patient reports that he is able to sleep well.    PCP : Glendale Chard, MD  Their chronic conditions include: Hypertension, Diabetes, Hypercholesterolemia  Office Visits:  04/21/20 Telephone call: Advised pt to increase Vitamin D to 2000 units on Saturday/Sunday and 1000 units Monday-Friday.   04/09/20 AWV and OV: Annual physical exam. DM/HTN check. Diabetic foot exam performed. HgbA1c increased to 7.7%. HTN well controlled. EKG performed, NSR w/o acute changes. Prostate test normal. Blood count normal. Vitamin D level is stable. Kidney function is stable. Referred to lymphatic PT for lymphedema. Nocturia has improved since taking HCTZ in the morning.   03/03/20 Telephone call: Pt notified samples of Janumet ready for pickup.   11/27/19 OV: Presented for HTN and DM follow up. Pt reported Lyrica 76m twice daily had greatly improved his neuropathy symptoms. Increased Lyrica to 72mtwice daily since symptoms improved but not resolved. Labs ordered (Lipid panel, HgbA1c, BMP8+EGFR). Lymphedema appears to be improving. HTN controlled  10/12/19 Telephone call: Pt contacted office regarding neurology referral for numbness in feet. Instructed that no neurology referral was needed at this time. Gabapentin 10070mightly offered, but pt  said he tried in the past with no improvement. Rx sent in for Lyrica. Pt instructed to follow up in 4 weeks.   09/03/19 OV: Presented for HTN and DM follow up. Labs ordered (CMP14+EGFR, HgbA1c, Vitamin B12, TSH). Referred to Physical Therapy for lymphedema. Noted symptoms suggestive of diabetic neuropathy. Exercise and weight loss recommendations provided. Pt instructed not to take B12 supplement on weekends due to elevated B12 levels.   Consult Visit: 09/27/19 PT OV w/ C. Dowtin: Pt reported insidious onset of right lower leg/foot edema. Elevation overnight helps, but swelling returns within 1 hour. 15-20 mmHg compression socks still allow swelling and dig into upper calf. Was not able to get on 20-30 mmHg compression socks recommended by PCP. Pt fitted for Circaid juxta-lite lower leg garments, PAC bands, and Farrow basic leg piece. Pt shown how to use ButPeaviner putting on compression socks. Provided pt education.   CCM Encounters: 02/07/20 SW: Confirmed pt has received assistance for Janumet. Reviewed and provided education regarding advanced directives.   09/03/19 PharmD: Assisted pt with PAP application at PCP OV today for Janumet (pt reported cost prohibitive on [employer]). Comprehensive medication review performed. Pt reports compliance, but no fill history available. Review compliance at follow up.   Medications: Outpatient Encounter Medications as of 07/22/2020  Medication Sig  . amLODipine (NORVASC) 10 MG tablet Take by mouth.  . aMarland Kitchenpirin EC 81 MG tablet Take 81 mg by mouth daily.  . aMarland Kitchenorvastatin (LIPITOR) 40 MG tablet Take 40 mg by mouth daily. 1/2 tablet daily  . Cyanocobalamin (VITAMIN B-12) 2500 MCG SUBL Place 1 tablet  under the tongue daily. Taking Monday through Friday  . hydrochlorothiazide (HYDRODIURIL) 25 MG tablet Take 25 mg by mouth daily.   Marland Kitchen lisinopril (PRINIVIL,ZESTRIL) 40 MG tablet Take 40 mg by mouth daily.   . pregabalin (LYRICA) 100 MG capsule Take 1 capsule by mouth twice  daily  . sitaGLIPtin-metformin (JANUMET) 50-1000 MG tablet Take 1 tablet by mouth 2 (two) times daily with a meal.  . Vitamin D, Cholecalciferol, 25 MCG (1000 UT) CAPS Take by mouth. 1000 units Monday-Friday, 2000 units Saturday and Sunday   No facility-administered encounter medications on file as of 07/22/2020.    Current Diagnosis/Assessment:    Goals Addressed            This Visit's Progress   . Pharmacy Care Plan       CARE PLAN ENTRY (see longitudinal plan of care for additional care plan information)  Current Barriers:  . Chronic Disease Management support, education, and care coordination needs related to Hypertension, Hyperlipidemia, and Diabetes   Hypertension BP Readings from Last 3 Encounters:  04/09/20 (!) 124/64  04/09/20 (!) 124/64  11/27/19 116/74   . Pharmacist Clinical Goal(s): o Over the next 180 days, patient will work with PharmD and providers to maintain BP goal <130/80 . Current regimen:   Amlodipine 3m daily  Hydrochlorothiazide 289mdaily  Lisinopril 4048maily . Interventions: o Provided dietary and exercise recommendations o Recommend patient check blood pressure at home if symptomatic, but at least once weekly  . Patient self care activities - Over the next 180 days, patient will: o Check BP when once weekly and if symptomatic, document, and provide at future appointments o Ensure daily salt intake < 2300 mg/day o Continue exercising at least 150 minutes weekly o Try to drink 64 ounces of water daily o Utilize PLATE method for meal planning (information mailed)  Hyperlipidemia Lab Results  Component Value Date/Time   LDLCALC 42 11/27/2019 02:54 PM   . Pharmacist Clinical Goal(s): o Over the next 180 days, patient will work with PharmD and providers to maintain LDL goal < 70 . Current regimen:  o Atorvastatin 9m84m2 tablet daily . Interventions: o Provided dietary and exercise recommendations o Counseled on the importance of  healthy fats (avocados, walnuts, flaxseed, etc) o Counseled on the importance of reducing snacking on foods high in saturated fats o Discussed results of most recent lipid panel . Patient self care activities - Over the next 180 days, patient will: o Continue cholesterol medication daily o Reduce snacking on unhealthy foods that are high in saturated fats o Limit fried food to once every other week in a meal or less o Eat more healthy fats o Continue exercising at least 150 minutes weekly  Diabetes Lab Results  Component Value Date/Time   HGBA1C 7.7 (H) 04/09/2020 03:39 PM   HGBA1C 6.9 (H) 11/27/2019 02:54 PM   . Pharmacist Clinical Goal(s): o Over the next 180 days, patient will work with PharmD and providers to achieve A1c goal <7% . Current regimen:  o Janumet 50/1000mg twice daily with a meal . Interventions: o Provided dietary and exercise recommendations o Counseled on the PLATE method for meal planning o Advised patient to limit sweets and sugary drinks (sweet tea, sodas, juices) o Discussed patient is planning to call [employer] patient assistance for refill of Janumet soon (3 refills remaining on current prescription) . Patient self care activities - Over the next 180 days, patient will: o Check blood sugar once daily, document, and provide  at future appointments o Contact provider with any episodes of hypoglycemia o Focus on eating a well-balanced diet, limiting carbohydrates o Reduce consumption of sweets and junk foods o Continue exercising at least 150 minutes weekly  Medication management . Pharmacist Clinical Goal(s): o Over the next 180 days, patient will work with PharmD and providers to maintain optimal medication adherence . Current pharmacy: [facility] . Interventions o Comprehensive medication review performed. o Continue current medication management strategy o Discussed that patient was supposed to increase Vitamin D supplement intake, but he thought  he was supposed to decrease . Patient self care activities - Over the next 180 days, patient will: o Focus on medication adherence by utilizing a system to remember to take medications o Take medications as prescribed o Start taking Vitamin D 1000 units daily Monday through Friday and 2000 units daily on Saturdays and Sundays  o Report any questions or concerns to PharmD and/or provider(s)  Please see past updates related to this goal by clicking on the "Past Updates" button in the selected goal         Diabetes   Recent Relevant Labs: Lab Results  Component Value Date/Time   HGBA1C 7.7 (H) 04/09/2020 03:39 PM   HGBA1C 6.9 (H) 11/27/2019 02:54 PM   MICROALBUR 80 04/09/2020 03:42 PM   MICROALBUR 80 01/30/2019 11:53 AM    Checking BG: Daily  Recent FBG Readings: 131-93 Recent pre-meal BG readings:  Recent 2hr PP BG readings:   Recent HS BG readings:  Patient has failed these meds in past: N/A Patient is currently uncontrolled on the following medications:   Janumet 50/1000mg twice daily with a meal  Aspirin 81m daily  Last diabetic Foot exam: 04/09/20 Last diabetic Eye exam: Lab Results  Component Value Date/Time   HMDIABEYEEXA Retinopathy (A) 06/25/2020 12:00 AM   We discussed:   Pt states that he does not miss any doses of his Janumet  Patient reports that he has a regular system with his medication . Diet extensively o Pt states that he feels full quickly so he eats smaller portions o Eats breakfast and dinner, but rarely eats lunch. He report that he tries to eat food low in carbohydrates. He tries to eat at the same time each day.  . Exercise extensively o Pt still walking at the park (8 miles) about 3-4 times weekly o Recommend pt get 30 minutes of moderate intensity exercise daily 5 times a week (150 minutes total per week)  Plan Continue current medications   Hypertension   Office blood pressures are  BP Readings from Last 3 Encounters:  04/09/20 (!)  124/64  04/09/20 (!) 124/64  11/27/19 116/74   Patient has failed these meds in the past: N/A Patient is currently controlled on the following medications:   Amlodipine 139mdaily  HCTZ 2526maily  Lisinopril 64m20mily  Patient checks BP at home infrequently  Patient home BP readings are ranging: None to report  We discussed:  Patient taking his medication on a consistent basis everyday at the same time each day  Limiting salt intake during his meals.   Plan Continue current medications    Hyperlipidemia   Lipid Panel     Component Value Date/Time   CHOL 113 11/27/2019 1454   TRIG 250 (H) 11/27/2019 1454   HDL 32 (L) 11/27/2019 1454   LDLCALC 42 11/27/2019 1454    The ASCVD Risk score (Goff DC Jr., et al., 2013) failed to calculate for  the following reasons:   The valid total cholesterol range is 130 to 320 mg/dL   Patient has failed these meds in past: N/A Patient is currently uncontrolled on the following medications:   Atorvastatin 58m 1/2 tablet daily  - patient reports that he takes his medication everyday around 6 or 7 pm.   We discussed:   Diet and exercise extensively   Patient reports that he is trying to eat more healthy  Limiting his fried food to once every other week.     Plan Continue current medications.   Neuropathy   Patient has failed these meds in past: Gabapentin Patient is currently controlled on the following medications:   Pregabalin 716mtwice daily  Plan Continue current medications   Over the Counter Medications   Patient is currently on the following medications:   Cholecalciferol 1000 units daily  Vitamin B12 250051mdaily Monday through Friday  We discussed:  Pt thought he was supposed to decrease Vitamin D to 1000 units Monday through Friday only (not take on weekends)  Advised pt that he was supposed to increase Vitamin D to 2000 units Saturday and Sunday and continue 1000 units Monday through  Friday  Plan Continue current medications  Vaccines   Reviewed and discussed patient's vaccination history.   Immunization History  Administered Date(s) Administered  . Janssen (J&J) SARS-COV-2 Vaccination 12/17/2019  . Moderna SARS-COVID-2 Vaccination 07/28/2020    We discussed: - The importance of getting a booster for COVID-19 vaccine. He reports that he will get the booster this month.  -Flu Vaccine HD: patient reports that he has gotten the vaccine before and he got sick, so he is not interested   Plan  Recommended patient receive COVID-19 vaccine booster. Patient reports that he will get the booster this month.  Medication Management   [facility] medications Uses pill box? No - Has medications in a drawer and has a routine. Feels pill box is too cumbersome Pt endorses 100% compliance  We discussed:   Importance of medication compliance  Plan Continue current medication management strategy   Follow up: 8 week phone visit  ValOrlando PennerharmD Clinical Pharmacist [facility] [phone] : [place] ChardD

## 2020-07-30 NOTE — Patient Instructions (Signed)
Visit Information  Goals Addressed            This Visit's Progress   . Pharmacy Care Plan       CARE PLAN ENTRY (see longitudinal plan of care for additional care plan information)  Current Barriers:  . Chronic Disease Management support, education, and care coordination needs related to Hypertension, Hyperlipidemia, and Diabetes   Hypertension BP Readings from Last 3 Encounters:  04/09/20 (!) 124/64  04/09/20 (!) 124/64  11/27/19 116/74   . Pharmacist Clinical Goal(s): o Over the next 180 days, patient will work with PharmD and providers to maintain BP goal <130/80 . Current regimen:   Amlodipine 10mg  daily  Hydrochlorothiazide 25mg  daily  Lisinopril 40mg  daily . Interventions: o Provided dietary and exercise recommendations o Recommend patient check blood pressure at home if symptomatic, but at least once weekly  . Patient self care activities - Over the next 180 days, patient will: o Check BP when once weekly and if symptomatic, document, and provide at future appointments o Ensure daily salt intake < 2300 mg/day o Continue exercising at least 150 minutes weekly o Try to drink 64 ounces of water daily o Utilize PLATE method for meal planning (information mailed)  Hyperlipidemia Lab Results  Component Value Date/Time   LDLCALC 42 11/27/2019 02:54 PM   . Pharmacist Clinical Goal(s): o Over the next 180 days, patient will work with PharmD and providers to maintain LDL goal < 70 . Current regimen:  o Atorvastatin 40mg  1/2 tablet daily . Interventions: o Provided dietary and exercise recommendations o Counseled on the importance of healthy fats (avocados, walnuts, flaxseed, etc) o Counseled on the importance of reducing snacking on foods high in saturated fats o Discussed results of most recent lipid panel . Patient self care activities - Over the next 180 days, patient will: o Continue cholesterol medication daily o Reduce snacking on unhealthy foods that are  high in saturated fats o Limit fried food to once every other week in a meal or less o Eat more healthy fats o Continue exercising at least 150 minutes weekly  Diabetes Lab Results  Component Value Date/Time   HGBA1C 7.7 (H) 04/09/2020 03:39 PM   HGBA1C 6.9 (H) 11/27/2019 02:54 PM   . Pharmacist Clinical Goal(s): o Over the next 180 days, patient will work with PharmD and providers to achieve A1c goal <7% . Current regimen:  o Janumet 50/1000mg  twice daily with a meal . Interventions: o Provided dietary and exercise recommendations o Counseled on the PLATE method for meal planning o Advised patient to limit sweets and sugary drinks (sweet tea, sodas, juices) o Discussed patient is planning to call [employer] patient assistance for refill of Janumet soon (3 refills remaining on current prescription) . Patient self care activities - Over the next 180 days, patient will: o Check blood sugar once daily, document, and provide at future appointments o Contact provider with any episodes of hypoglycemia o Focus on eating a well-balanced diet, limiting carbohydrates o Reduce consumption of sweets and junk foods o Continue exercising at least 150 minutes weekly  Medication management . Pharmacist Clinical Goal(s): o Over the next 180 days, patient will work with PharmD and providers to maintain optimal medication adherence . [facility] 11/[facility]/2019 . Interventions o Comprehensive medication review performed. o Continue current medication management strategy o Discussed that patient was supposed to increase Vitamin D supplement intake, but he thought he was supposed to decrease . Patient self care activities -  Over the next 180 days, patient will: o Focus on medication adherence by utilizing a system to remember to take medications o Take medications as prescribed o Start taking Vitamin D 1000 units daily Monday through Friday and 2000 units daily on Saturdays and Sundays   o Report any questions or concerns to PharmD and/or provider(s)  Please see past updates related to this goal by clicking on the "Past Updates" button in the selected goal         The patient verbalized understanding of instructions, educational materials, and care plan provided today and declined offer to receive copy of patient instructions, educational materials, and care plan.   Telephone follow up appointment with pharmacy team member scheduled for: 2 month follow up   Cherylin Mylar, PharmD Clinical Pharmacist [facility] [phone]   Hypertension, Adult Hypertension is another name for high blood pressure. High blood pressure forces your heart to work harder to pump blood. This can cause problems over time. There are two numbers in a blood pressure reading. There is a top number (systolic) over a bottom number (diastolic). It is best to have a blood pressure that is below 120/80. Healthy choices can help lower your blood pressure, or you may need medicine to help lower it. What are the causes? The cause of this condition is not known. Some conditions may be related to high blood pressure. What increases the risk?  Smoking.  Having type 2 diabetes mellitus, high cholesterol, or both.  Not getting enough exercise or physical activity.  Being overweight.  Having too much fat, sugar, calories, or salt (sodium) in your diet.  Drinking too much alcohol.  Having long-term (chronic) kidney disease.  Having a family history of high blood pressure.  Age. Risk increases with age.  Race. You may be at higher risk if you are African American.  Gender. Men are at higher risk than women before age 16. After age 66, women are at higher risk than men.  Having obstructive sleep apnea.  Stress. What are the signs or symptoms?  High blood pressure may not cause symptoms. Very high blood pressure (hypertensive crisis) may  cause: ? Headache. ? Feelings of worry or nervousness (anxiety). ? Shortness of breath. ? Nosebleed. ? A feeling of being sick to your stomach (nausea). ? Throwing up (vomiting). ? Changes in how you see. ? Very bad chest pain. ? Seizures. How is this treated?  This condition is treated by making healthy lifestyle changes, such as: ? Eating healthy foods. ? Exercising more. ? Drinking less alcohol.  Your health care provider may prescribe medicine if lifestyle changes are not enough to get your blood pressure under control, and if: ? Your top number is above 130. ? Your bottom number is above 80.  Your personal target blood pressure may vary. Follow these instructions at home: Eating and drinking   If told, follow the DASH eating plan. To follow this plan: ? Fill one half of your plate at each meal with fruits and vegetables. ? Fill one fourth of your plate at each meal with whole grains. Whole grains include whole-wheat pasta, brown rice, and whole-grain bread. ? Eat or drink low-fat dairy products, such as skim milk or low-fat yogurt. ? Fill one fourth of your plate at each meal with low-fat (lean) proteins. Low-fat proteins include fish, chicken without skin, eggs, beans, and tofu. ? Avoid fatty meat, cured and processed meat, or chicken with skin. ? Avoid pre-made or processed food.  Eat less than 1,500 mg of salt each day.  Do not drink alcohol if: ? Your doctor tells you not to drink. ? You are pregnant, may be pregnant, or are planning to become pregnant.  If you drink alcohol: ? Limit how much you use to:  0-1 drink a day for women.  0-2 drinks a day for men. ? Be aware of how much alcohol is in your drink. In the [place] one drink equals one 12 oz bottle of beer (355 mL), one 5 oz glass of wine (148 mL), or one 1 oz glass of hard liquor (44 mL). Lifestyle   Work with your doctor to stay at a healthy weight or to lose weight. Ask your doctor what the best  weight is for you.  Get at least 30 minutes of exercise most days of the week. This may include walking, swimming, or biking.  Get at least 30 minutes of exercise that strengthens your muscles (resistance exercise) at least 3 days a week. This may include lifting weights or doing Pilates.  Do not use any products that contain nicotine or tobacco, such as cigarettes, e-cigarettes, and chewing tobacco. If you need help quitting, ask your doctor.  Check your blood pressure at home as told by your doctor.  Keep all follow-up visits as told by your doctor. This is important. Medicines  Take over-the-counter and prescription medicines only as told by your doctor. Follow directions carefully.  Do not skip doses of blood pressure medicine. The medicine does not work as well if you skip doses. Skipping doses also puts you at risk for problems.  Ask your doctor about side effects or reactions to medicines that you should watch for. Contact a doctor if you:  Think you are having a reaction to the medicine you are taking.  Have headaches that keep coming back (recurring).  Feel dizzy.  Have swelling in your ankles.  Have trouble with your vision. Get help right away if you:  Get a very bad headache.  Start to feel mixed up (confused).  Feel weak or numb.  Feel faint.  Have very bad pain in your: ? Chest. ? Belly (abdomen).  Throw up more than once.  Have trouble breathing. Summary  Hypertension is another name for high blood pressure.  High blood pressure forces your heart to work harder to pump blood.  For most people, a normal blood pressure is less than 120/80.  Making healthy choices can help lower blood pressure. If your blood pressure does not get lower with healthy choices, you may need to take medicine. This information is not intended to replace advice given to you by your health care provider. Make sure you discuss any questions you have with your health care  provider. Document Revised: 05/10/2018 Document Reviewed: 05/10/2018 Elsevier Patient Education  2020 [employer].

## 2020-08-13 ENCOUNTER — Telehealth: Payer: Self-pay

## 2020-08-13 ENCOUNTER — Other Ambulatory Visit: Payer: Self-pay

## 2020-08-13 ENCOUNTER — Encounter: Payer: Self-pay | Admitting: Internal Medicine

## 2020-08-13 ENCOUNTER — Ambulatory Visit (INDEPENDENT_AMBULATORY_CARE_PROVIDER_SITE_OTHER): Payer: Medicare HMO | Admitting: Internal Medicine

## 2020-08-13 VITALS — BP 138/70 | HR 86 | Temp 98.5°F | Ht 69.0 in | Wt 218.0 lb

## 2020-08-13 DIAGNOSIS — E1165 Type 2 diabetes mellitus with hyperglycemia: Secondary | ICD-10-CM | POA: Diagnosis not present

## 2020-08-13 DIAGNOSIS — I10 Essential (primary) hypertension: Secondary | ICD-10-CM | POA: Diagnosis not present

## 2020-08-13 DIAGNOSIS — E6609 Other obesity due to excess calories: Secondary | ICD-10-CM

## 2020-08-13 DIAGNOSIS — E66811 Obesity, class 1: Secondary | ICD-10-CM

## 2020-08-13 DIAGNOSIS — Z6832 Body mass index (BMI) 32.0-32.9, adult: Secondary | ICD-10-CM

## 2020-08-13 NOTE — Patient Instructions (Signed)

## 2020-08-13 NOTE — Progress Notes (Signed)
I,Yamilka [employer] as a Education administrator for [name], MD.,have documented all relevant documentation on the behalf of [name], MD,as directed by  [name], MD while in the presence of [name], MD. This visit occurred during the SARS-CoV-2 public health emergency.  Safety protocols were in place, including screening questions prior to the visit, additional usage of staff PPE, and extensive cleaning of exam room while observing appropriate contact time as indicated for disinfecting solutions.  Subjective:     Patient ID: Marcus Davis , male    DOB: October 11, 1950 , 69 y.o.   MRN: [id number]   Chief Complaint  Patient presents with  . Hypertension  . Diabetes    HPI  Patient here for a f/u on his HTN and DM. He reports compliance with meds. He reports a wider range of BS - states sometimes sugars are in 90s in am, sometimes in 130s. He reports his appetite has changed as well, he eats less than he has in the past.   Hypertension This is a chronic problem. The current episode started more than 1 year ago. The problem has been gradually improving since onset. The problem is controlled. Pertinent negatives include no blurred vision, chest pain or palpitations. The current treatment provides moderate improvement.  Diabetes He presents for his follow-up diabetic visit. He has type 2 diabetes mellitus. There are no hypoglycemic associated symptoms. There are no diabetic associated symptoms. Pertinent negatives for diabetes include no blurred vision and no chest pain. There are no hypoglycemic complications. Risk factors for coronary artery disease include diabetes mellitus, dyslipidemia, hypertension, male sex and sedentary lifestyle. He is compliant with treatment most of the time. He is following a diabetic diet. He participates in exercise intermittently. Eye exam is current.     Past Medical History:  Diagnosis Date  . Allergy   . DM (diabetes mellitus) ([place])   . HTN  (hypertension)   . Hyperlipidemia      Family History  Problem Relation Age of Onset  . Diabetes Mother   . Hypertension Mother   . Stroke Mother   . Hypertension Father   . Diabetes Father   . Stroke Paternal Grandfather      Current Outpatient Medications:  .  amLODipine (NORVASC) 10 MG tablet, Take by mouth., Disp: , Rfl:  .  aspirin EC 81 MG tablet, Take 81 mg by mouth daily., Disp: , Rfl:  .  atorvastatin (LIPITOR) 40 MG tablet, Take 40 mg by mouth daily. 1/2 tablet daily, Disp: , Rfl:  .  Cyanocobalamin (VITAMIN B-12) 2500 MCG SUBL, Place 1 tablet under the tongue daily. Taking Monday through Friday, Disp: , Rfl:  .  hydrochlorothiazide (HYDRODIURIL) 25 MG tablet, Take 25 mg by mouth daily. , Disp: , Rfl:  .  lisinopril (PRINIVIL,ZESTRIL) 40 MG tablet, Take 40 mg by mouth daily. , Disp: , Rfl:  .  pregabalin (LYRICA) 100 MG capsule, Take 1 capsule by mouth twice daily, Disp: 60 capsule, Rfl: 5 .  sitaGLIPtin-metformin (JANUMET) 50-1000 MG tablet, Take 1 tablet by mouth 2 (two) times daily with a meal., Disp: , Rfl:  .  Vitamin D, Cholecalciferol, 25 MCG (1000 UT) CAPS, Take by mouth. 1000 units Monday-Friday, 2000 units Saturday and Sunday, Disp: , Rfl:    Allergies  Allergen Reactions  . Pollen Extract Other (See Comments)    Sneezing, runny nose, congestion,      Review of Systems  Constitutional: Negative.   Eyes: Negative for  blurred vision.  Respiratory: Negative.   Cardiovascular: Negative.  Negative for chest pain and palpitations.  Gastrointestinal: Negative.   Neurological: Negative.   Psychiatric/Behavioral: Negative.      Today's Vitals   08/13/20 1146 08/13/20 1249  BP: 140/70 138/70  Pulse: 86   Temp: 98.5 F (36.9 C)   TempSrc: Oral   Weight: 218 lb (98.9 kg)   Height: _0  (1.753 m)   PainSc: 0-No pain    Body mass index is 32.19 kg/m.  Wt Readings from Last 3 Encounters:  08/13/20 218 lb (98.9 kg)  04/09/20 (!) 216 lb 3.2 oz (98.1 kg)   04/09/20 (!) 216 lb 3.2 oz (98.1 kg)    Objective:  Physical Exam Vitals and nursing note reviewed.  Constitutional:      Appearance: Normal appearance.  Cardiovascular:     Rate and Rhythm: Normal rate and regular rhythm.     Heart sounds: Normal heart sounds.  Pulmonary:     Effort: Pulmonary effort is normal.     Breath sounds: Normal breath sounds.  Musculoskeletal:     Cervical back: Normal range of motion.  Skin:    General: Skin is warm.  Neurological:     General: No focal deficit present.     Mental Status: He is alert.  Psychiatric:        Mood and Affect: Mood normal.         Assessment And Plan:     1. Essential hypertension Comments: Chronic, fair control. I will check renal function today. He is aware that optimal BP is less than 130/80. Encouraged to incorporate more exercise into his dail  2. Uncontrolled type 2 diabetes mellitus with hyperglycemia (HCC) Comments: Chronic, I will check labs as listed below. I will adjust meds as needed. encouraged to eat regular meals.  - CMP14+EGFR - Hemoglobin A1c  3. Class 1 obesity due to excess calories with serious comorbidity and body mass index (BMI) of 32.0 to 32.9 in adult Comments: He is encouraged to strive to lose 10-15 pounds to decrease cardiac risk. He was reminded of importance of regular exercise.      Patient was given opportunity to ask questions. Patient verbalized understanding of the plan and was able to repeat key elements of the plan. All questions were answered to their satisfaction.  [name], MD   I, [name], MD, have reviewed all documentation for this visit. The documentation on 08/17/20 for the exam, diagnosis, procedures, and orders are all accurate and complete.  THE PATIENT IS ENCOURAGED TO PRACTICE SOCIAL DISTANCING DUE TO THE COVID-19 PANDEMIC.

## 2020-08-13 NOTE — Chronic Care Management (AMB) (Signed)
    Chronic Care Management Pharmacy Assistant   Name: Marcus Davis  MRN: [id number] DOB: August 14, 1951  Reason for Encounter: Patient Assistance Coordination  PCP : Marcus Peng, MD   08/13/2020- Patient brought [employer] patient assistance form to office visit with Dr Marcus Davis today. Patient has completed and signed his portion of form. Fill out medication portion of form, awaiting provider signature to mail to [employer] Patient Assistance Program. Marcus Davis, CPP notified.     Allergies:   Allergies  Allergen Reactions  . Pollen Extract Other (See Comments)    Sneezing, runny nose, congestion,     Medications: Outpatient Encounter Medications as of 08/13/2020  Medication Sig  . amLODipine (NORVASC) 10 MG tablet Take by mouth.  Marland Kitchen aspirin EC 81 MG tablet Take 81 mg by mouth daily.  Marland Kitchen atorvastatin (LIPITOR) 40 MG tablet Take 40 mg by mouth daily. 1/2 tablet daily  . Cyanocobalamin (VITAMIN B-12) 2500 MCG SUBL Place 1 tablet under the tongue daily. Taking Monday through Friday  . hydrochlorothiazide (HYDRODIURIL) 25 MG tablet Take 25 mg by mouth daily.   Marland Kitchen lisinopril (PRINIVIL,ZESTRIL) 40 MG tablet Take 40 mg by mouth daily.   . pregabalin (LYRICA) 100 MG capsule Take 1 capsule by mouth twice daily  . sitaGLIPtin-metformin (JANUMET) 50-1000 MG tablet Take 1 tablet by mouth 2 (two) times daily with a meal.  . Vitamin D, Cholecalciferol, 25 MCG (1000 UT) CAPS Take by mouth. 1000 units Monday-Friday, 2000 units Saturday and Sunday   No facility-administered encounter medications on file as of 08/13/2020.    Current Diagnosis: Patient Active Problem List   Diagnosis Date Noted  . Type 2 diabetes mellitus without complication, without long-term current use of insulin (HCC) 02/08/2019  . Nocturia 01/30/2019  . Uncontrolled type 2 diabetes mellitus with hyperglycemia (HCC) 08/28/2018  . Essential hypertension 08/28/2018  . Class 1 obesity due to excess calories with serious comorbidity  and body mass index (BMI) of 31.0 to 31.9 in adult 08/28/2018     Follow-Up:  Patient Assistance Coordination  08/22/2020- [employer] Application signed by provider, mailed form for [employer] to [employer] Patient Assistance Program. Marcus Davis notified.  Marcus Davis, CMA Clinical Pharmacist Assistant [phone]

## 2020-08-14 LAB — CMP14+EGFR
ALT: 32 IU/L (ref 0–44)
AST: 24 IU/L (ref 0–40)
Albumin/Globulin Ratio: 1.4 (ref 1.2–2.2)
Albumin: 4.3 g/dL (ref 3.8–4.8)
Alkaline Phosphatase: 82 IU/L (ref 44–121)
BUN/Creatinine Ratio: 10 (ref 10–24)
BUN: 12 mg/dL (ref 8–27)
Bilirubin Total: 0.5 mg/dL (ref 0.0–1.2)
CO2: 26 mmol/L (ref 20–29)
Calcium: 9.8 mg/dL (ref 8.6–10.2)
Chloride: 97 mmol/L (ref 96–106)
Creatinine, Ser: 1.15 mg/dL (ref 0.76–1.27)
GFR calc Af Amer: 76 mL/min/{1.73_m2} (ref >59)
GFR calc non Af Amer: 65 mL/min/{1.73_m2} (ref >59)
Globulin, Total: 3 g/dL (ref 1.5–4.5)
Glucose: 119 mg/dL — ABNORMAL HIGH (ref 65–99)
Potassium: 4.5 mmol/L (ref 3.5–5.2)
Sodium: 138 mmol/L (ref 134–144)
Total Protein: 7.3 g/dL (ref 6.0–8.5)

## 2020-08-14 LAB — HEMOGLOBIN A1C
Est. average glucose Bld gHb Est-mCnc: 194 mg/dL
Hgb A1c MFr Bld: 8.4 % — ABNORMAL HIGH (ref 4.8–5.6)

## 2020-08-15 ENCOUNTER — Telehealth: Payer: Self-pay

## 2020-08-15 NOTE — Telephone Encounter (Signed)
-----   Message from [name], MD sent at 08/14/2020  9:35 PM EST ----- Your liver and kidney function are stable. Your hba1c has gone up to 8.4. Have you been taking meds regularly--twice daily? I suggest cutting out sodas and juices. Also important to increase water intake. If this does not improve, we will need to add more meds.

## 2020-09-03 ENCOUNTER — Encounter: Payer: Self-pay | Admitting: Internal Medicine

## 2020-09-10 ENCOUNTER — Telehealth: Payer: Self-pay

## 2020-09-10 NOTE — Chronic Care Management (AMB) (Signed)
    Chronic Care Management Pharmacy Assistant   Name: Rydell Wiegel  MRN: [id number] DOB: Jun 23, 1951  Reason for Encounter: Medication Review    PCP : [name], MD  Allergies:   Allergies  Allergen Reactions  . Pollen Extract Other (See Comments)    Sneezing, runny nose, congestion,     Medications: Outpatient Encounter Medications as of 09/10/2020  Medication Sig  . amLODipine (NORVASC) 10 MG tablet Take by mouth.  Marland Kitchen aspirin EC 81 MG tablet Take 81 mg by mouth daily.  Marland Kitchen atorvastatin (LIPITOR) 40 MG tablet Take 40 mg by mouth daily. 1/2 tablet daily  . Cyanocobalamin (VITAMIN B-12) 2500 MCG SUBL Place 1 tablet under the tongue daily. Taking Monday through Friday  . hydrochlorothiazide (HYDRODIURIL) 25 MG tablet Take 25 mg by mouth daily.   Marland Kitchen lisinopril (PRINIVIL,ZESTRIL) 40 MG tablet Take 40 mg by mouth daily.   . pregabalin (LYRICA) 100 MG capsule Take 1 capsule by mouth twice daily  . sitaGLIPtin-metformin (JANUMET) 50-1000 MG tablet Take 1 tablet by mouth 2 (two) times daily with a meal.  . Vitamin D, Cholecalciferol, 25 MCG (1000 UT) CAPS Take by mouth. 1000 units Monday-Friday, 2000 units Saturday and Sunday   No facility-administered encounter medications on file as of 09/10/2020.    Current Diagnosis: Patient Active Problem List   Diagnosis Date Noted  . Type 2 diabetes mellitus without complication, without long-term current use of insulin (HCC) 02/08/2019  . Nocturia 01/30/2019  . Uncontrolled type 2 diabetes mellitus with hyperglycemia (HCC) 08/28/2018  . Essential hypertension 08/28/2018  . Class 1 obesity due to excess calories with serious comorbidity and body mass index (BMI) of 31.0 to 31.9 in adult 08/28/2018      Follow-Up:  Pharmacist Review - Reviewed chart and adherence measures. Per [employer] data Total Gaps - all measures are (1) 1. Diabetes Eye Exam - Performed from EHR data- need MD and result documented.  Total Gaps - Star measures are  (1).  Cherylin Mylar, CPP Notified  Jon Gills, [facility] Clinical Pharmacist Assistant [phone]

## 2020-09-22 ENCOUNTER — Telehealth: Payer: Self-pay | Admitting: *Deleted

## 2020-09-22 NOTE — Chronic Care Management (AMB) (Signed)
  Care Management   Note  09/22/2020 Name: Marcus Davis MRN: [id number] DOB: 1951/02/06  Marcus Davis is a 70 y.o. year old male who is a primary care patient of [name], MD and is actively engaged with the care management team. I reached out to [name] by phone today to assist with re-scheduling a follow up visit with the Pharmacist on 09/25/2020  Follow up plan: Unsuccessful telephone outreach attempt made. A HIPAA compliant phone message was left for the patient providing contact information and requesting a return call.  The care management team will reach out to the patient again over the next 7 days.  If patient returns call to provider office, please advise to call Embedded Care Management Care Guide [name] at [phone]  [facility] Guide, Embedded Care Coordination [facility] Management

## 2020-09-25 ENCOUNTER — Telehealth: Payer: Self-pay

## 2020-09-26 NOTE — Chronic Care Management (AMB) (Signed)
  Care Management   Note  09/26/2020 Name: [name] MRN: [id number] DOB: [birth year]  Marcus Davis is a 70 y.o. year old male who is a primary care patient of [name], MD and is actively engaged with the care management team. I reached out to [name] by phone today to assist with re-scheduling a follow up visit with the Pharmacist  Follow up plan: Unsuccessful telephone outreach attempt made. A HIPAA compliant phone message was left for the patient providing contact information and requesting a return call.  The care management team will reach out to the patient again over the next 7 days.  If patient returns call to provider office, please advise to call Embedded Care Management Care Guide [name] at [phone].  [name]  Care Guide, Embedded Care Coordination [facility] Management

## 2020-09-30 ENCOUNTER — Telehealth: Payer: Self-pay

## 2020-09-30 NOTE — Chronic Care Management (AMB) (Signed)
  Care Management   Note  09/30/2020 Name: Dagoberto Nealy MRN: [id number] DOB: August 07, 1951  Claudie Rathbone is a 70 y.o. year old male who is a primary care patient of [name], MD and is actively engaged with the care management team. I reached out to [name] by phone today to assist with re-scheduling a follow up visit with the Pharmacist  Follow up plan: Telephone appointment with care management team member scheduled for: 10/22/2020  [facility] Guide, Embedded Care Coordination [facility]  Care Management

## 2020-10-22 ENCOUNTER — Telehealth: Payer: Medicare HMO

## 2020-11-05 ENCOUNTER — Telehealth: Payer: Self-pay

## 2020-11-05 NOTE — Chronic Care Management (AMB) (Signed)
Chronic Care Management Pharmacy Assistant   Name: Marcus Davis  MRN: [id number] DOB: 01-29-1951  Reason for Encounter: Hypertension Adherence Call   08/13/20-Sanders, Melina Schools, MD (OV).  No Medication Changes.   PCP : [name], MD  Allergies:   Allergies  Allergen Reactions  . Pollen Extract Other (See Comments)    Sneezing, runny nose, congestion,     Medications: Outpatient Encounter Medications as of 11/05/2020  Medication Sig  . amLODipine (NORVASC) 10 MG tablet Take by mouth.  Marland Kitchen aspirin EC 81 MG tablet Take 81 mg by mouth daily.  Marland Kitchen atorvastatin (LIPITOR) 40 MG tablet Take 40 mg by mouth daily. 1/2 tablet daily  . Cyanocobalamin (VITAMIN B-12) 2500 MCG SUBL Place 1 tablet under the tongue daily. Taking Monday through Friday  . hydrochlorothiazide (HYDRODIURIL) 25 MG tablet Take 25 mg by mouth daily.   Marland Kitchen lisinopril (PRINIVIL,ZESTRIL) 40 MG tablet Take 40 mg by mouth daily.   . pregabalin (LYRICA) 100 MG capsule Take 1 capsule by mouth twice daily  . sitaGLIPtin-metformin (JANUMET) 50-1000 MG tablet Take 1 tablet by mouth 2 (two) times daily with a meal.  . Vitamin D, Cholecalciferol, 25 MCG (1000 UT) CAPS Take by mouth. 1000 units Monday-Friday, 2000 units Saturday and Sunday   No facility-administered encounter medications on file as of 11/05/2020.    Current Diagnosis: Patient Active Problem List   Diagnosis Date Noted  . Type 2 diabetes mellitus without complication, without long-term current use of insulin (HCC) 02/08/2019  . Nocturia 01/30/2019  . Uncontrolled type 2 diabetes mellitus with hyperglycemia (HCC) 08/28/2018  . Essential hypertension 08/28/2018  . Class 1 obesity due to excess calories with serious comorbidity and body mass index (BMI) of 31.0 to 31.9 in adult 08/28/2018    Reviewed chart prior to disease state call. Spoke with patient regarding BP  Recent Office Vitals: BP Readings from Last 3 Encounters:  08/13/20 138/70  04/09/20 (!)  124/64  04/09/20 (!) 124/64   Pulse Readings from Last 3 Encounters:  08/13/20 86  04/09/20 69  04/09/20 85    Wt Readings from Last 3 Encounters:  08/13/20 218 lb (98.9 kg)  04/09/20 (!) 216 lb 3.2 oz (98.1 kg)  04/09/20 (!) 216 lb 3.2 oz (98.1 kg)     Kidney Function Lab Results  Component Value Date/Time   CREATININE 1.15 08/13/2020 04:51 PM   CREATININE 0.95 11/27/2019 02:54 PM   GFRNONAA 65 08/13/2020 04:51 PM   GFRAA 76 08/13/2020 04:51 PM    BMP Latest Ref Rng & Units 08/13/2020 11/27/2019 09/03/2019  Glucose 65 - 99 mg/dL 956(O) 130(Q) 657(Q)  BUN 8 - 27 mg/dL 12 10 8   Creatinine 0.76 - 1.27 mg/dL 4.69 6.29  BUN/Creat Ratio 10 - 24 10 11  9(L)  Sodium 134 - 144 mmol/L 138 139 140  Potassium 3.5 - 5.2 mmol/L 4.5 4.2 4.4  Chloride 96 - 106 mmol/L 97 102 101  CO2 20 - 29 mmol/L 26 21 24   Calcium 8.6 - 10.2 mg/dL 9.8 9.5 9.9    . Current antihypertensive regimen:   Amlodipine 10mg  daily  Hydrochlorothiazide 25mg  daily  Lisinopril 40mg  daily   . Any recent hospitalizations or ED visits since last visit with CPP? No     11/05/20-Unsuccessful telephone outreach to the patient regarding his blood pressure monitoring. Left a voicemail with a call back number.  11/06/20-Second attempt to contact the patient regarding his blood pressure monitoring. No answer, left a voicemail for a call  back.  11/07/20-Third unsuccessful attempt to contact the patient regarding his blood pressure monitoring. Left a voicemail with a call back number.  Follow-Up:  Pharmacist Review   Cherylin Mylar, CPP Notified.  Suezanne Cheshire, CMA Clinical Pharmacist Assistant [phone] CCM Total Time: 15 minutes

## 2020-11-17 ENCOUNTER — Ambulatory Visit (INDEPENDENT_AMBULATORY_CARE_PROVIDER_SITE_OTHER): Payer: Medicare HMO | Admitting: Internal Medicine

## 2020-11-17 ENCOUNTER — Encounter: Payer: Self-pay | Admitting: Internal Medicine

## 2020-11-17 ENCOUNTER — Other Ambulatory Visit: Payer: Self-pay

## 2020-11-17 VITALS — BP 134/76 | HR 95 | Temp 98.0°F | Ht 69.0 in | Wt 224.0 lb

## 2020-11-17 DIAGNOSIS — E1141 Type 2 diabetes mellitus with diabetic mononeuropathy: Secondary | ICD-10-CM | POA: Diagnosis not present

## 2020-11-17 DIAGNOSIS — Z6833 Body mass index (BMI) 33.0-33.9, adult: Secondary | ICD-10-CM

## 2020-11-17 DIAGNOSIS — E1165 Type 2 diabetes mellitus with hyperglycemia: Secondary | ICD-10-CM | POA: Diagnosis not present

## 2020-11-17 DIAGNOSIS — E6609 Other obesity due to excess calories: Secondary | ICD-10-CM

## 2020-11-17 DIAGNOSIS — I10 Essential (primary) hypertension: Secondary | ICD-10-CM

## 2020-11-17 DIAGNOSIS — E7800 Pure hypercholesterolemia, unspecified: Secondary | ICD-10-CM

## 2020-11-17 MED ORDER — PREGABALIN 100 MG PO CAPS: 100.0000 mg | ORAL_CAPSULE | Freq: Two times a day (BID) | ORAL | 1 refills | Status: DC

## 2020-11-17 NOTE — Patient Instructions (Signed)
Diabetes Mellitus and Exercise Exercising regularly is important for overall health, especially for people who have diabetes mellitus. Exercising is not only about losing weight. It has many other health benefits, such as increasing muscle strength and bone density and reducing body fat and stress. This leads to improved fitness, flexibility, and endurance, all of which result in better overall health. What are the benefits of exercise if I have diabetes? Exercise has many benefits for people with diabetes. They include:  Helping to lower and control blood sugar (glucose).  Helping the body to respond better to the hormone insulin by improving insulin sensitivity.  Reducing how much insulin the body needs.  Lowering the risk for heart disease by: ? Lowering "bad" cholesterol and triglyceride levels. ? Increasing "good" cholesterol levels. ? Lowering blood pressure. ? Lowering blood glucose levels. What is my activity plan? Your health care provider or certified diabetes educator can help you make a plan for the type and frequency of exercise that works for you. This is called your activity plan. Be sure to:  Get at least 150 minutes of medium-intensity or high-intensity exercise each week. Exercises may include brisk walking, biking, or water aerobics.  Do stretching and strengthening exercises, such as yoga or weight lifting, at least 2 times a week.  Spread out your activity over at least 3 days of the week.  Get some form of physical activity each day. ? Do not go more than 2 days in a row without some kind of physical activity. ? Avoid being inactive for more than 90 minutes at a time. Take frequent breaks to walk or stretch.  Choose exercises or activities that you enjoy. Set realistic goals.  Start slowly and gradually increase your exercise intensity over time.   How do I manage my diabetes during exercise? Monitor your blood glucose  Check your blood glucose before and  after exercising. If your blood glucose is: ? 240 mg/dL (13.3 mmol/L) or higher before you exercise, check your urine for ketones. These are chemicals created by the liver. If you have ketones in your urine, do not exercise until your blood glucose returns to normal. ? 100 mg/dL (5.6 mmol/L) or lower, eat a snack containing 15-20 grams of carbohydrate. Check your blood glucose 15 minutes after the snack to make sure that your glucose level is above 100 mg/dL (5.6 mmol/L) before you start your exercise.  Know the symptoms of low blood glucose (hypoglycemia) and how to treat it. Your risk for hypoglycemia increases during and after exercise. Follow these tips and your health care provider's instructions  Keep a carbohydrate snack that is fast-acting for use before, during, and after exercise to help prevent or treat hypoglycemia.  Avoid injecting insulin into areas of the body that are going to be exercised. For example, avoid injecting insulin into: ? Your arms, when you are about to play tennis. ? Your legs, when you are about to go jogging.  Keep records of your exercise habits. Doing this can help you and your health care provider adjust your diabetes management plan as needed. Write down: ? Food that you eat before and after you exercise. ? Blood glucose levels before and after you exercise. ? The type and amount of exercise you have done.  Work with your health care provider when you start a new exercise or activity. He or she may need to: ? Make sure that the activity is safe for you. ? Adjust your insulin, other medicines, and food that   you eat.  Drink plenty of water while you exercise. This prevents loss of water (dehydration) and problems caused by a lot of heat in the body (heat stroke).   Where to find more information  American Diabetes Association: www.diabetes.org Summary  Exercising regularly is important for overall health, especially for people who have diabetes  mellitus.  Exercising has many health benefits. It increases muscle strength and bone density and reduces body fat and stress. It also lowers and controls blood glucose.  Your health care provider or certified diabetes educator can help you make an activity plan for the type and frequency of exercise that works for you.  Work with your health care provider to make sure any new activity is safe for you. Also work with your health care provider to adjust your insulin, other medicines, and the food you eat. This information is not intended to replace advice given to you by your health care provider. Make sure you discuss any questions you have with your health care provider. Document Revised: 05/28/2019 Document Reviewed: 05/28/2019 Elsevier Patient Education  2021 Elsevier Inc.  

## 2020-11-17 NOTE — Progress Notes (Signed)
I,Katawbba Wiggins,acting as a Education administrator for [name], MD.,have documented all relevant documentation on the behalf of [name], MD,as directed by  [name], MD while in the presence of [name], MD.  This visit occurred during the SARS-CoV-2 public health emergency.  Safety protocols were in place, including screening questions prior to the visit, additional usage of staff PPE, and extensive cleaning of exam room while observing appropriate contact time as indicated for disinfecting solutions.  Subjective:     Patient ID: Marcus Davis , male    DOB: March 19, 1951 , 70 y.o.   MRN: [id number]   Chief Complaint  Patient presents with  . Diabetes  . Hypertension    HPI  Patient here for a f/u on his HTN and DM. He is also followed by the [place]. He reports compliance with meds. He denies headaches, chest pain and shortness of breath. He reports he is moving around more since the weather has started to improve. He reports his sugars are running between 86-109.   Diabetes He presents for his follow-up diabetic visit. He has type 2 diabetes mellitus. There are no hypoglycemic associated symptoms. There are no diabetic associated symptoms. Pertinent negatives for diabetes include no blurred vision and no chest pain. There are no hypoglycemic complications. Risk factors for coronary artery disease include diabetes mellitus, dyslipidemia, hypertension, male sex and sedentary lifestyle. He is compliant with treatment most of the time. He is following a diabetic diet. He participates in exercise intermittently. Eye exam is current.  Hypertension This is a chronic problem. The current episode started more than 1 year ago. The problem has been gradually improving since onset. The problem is controlled. Pertinent negatives include no blurred vision, chest pain or palpitations. The current treatment provides moderate improvement.     Past Medical History:  Diagnosis Date  . Allergy   .  DM (diabetes mellitus) ([place])   . HTN (hypertension)   . Hyperlipidemia      Family History  Problem Relation Age of Onset  . Diabetes Mother   . Hypertension Mother   . Stroke Mother   . Hypertension Father   . Diabetes Father   . Stroke Paternal Grandfather      Current Outpatient Medications:  .  amLODipine (NORVASC) 10 MG tablet, Take by mouth., Disp: , Rfl:  .  aspirin EC 81 MG tablet, Take 81 mg by mouth daily., Disp: , Rfl:  .  atorvastatin (LIPITOR) 40 MG tablet, Take 40 mg by mouth daily. 1/2 tablet daily, Disp: , Rfl:  .  Cyanocobalamin (VITAMIN B-12) 2500 MCG SUBL, Place 1 tablet under the tongue daily. Taking Monday through Friday, Disp: , Rfl:  .  hydrochlorothiazide (HYDRODIURIL) 25 MG tablet, Take 25 mg by mouth daily. , Disp: , Rfl:  .  lisinopril (PRINIVIL,ZESTRIL) 40 MG tablet, Take 40 mg by mouth daily. , Disp: , Rfl:  .  sitaGLIPtin-metformin (JANUMET) 50-1000 MG tablet, Take 1 tablet by mouth 2 (two) times daily with a meal., Disp: , Rfl:  .  Vitamin D, Cholecalciferol, 25 MCG (1000 UT) CAPS, Take by mouth. 1000 units Monday-Friday, 2000 units Saturday and Sunday, Disp: , Rfl:  .  pregabalin (LYRICA) 100 MG capsule, Take 1 capsule (100 mg total) by mouth 2 (two) times daily., Disp: 180 capsule, Rfl: 1   Allergies  Allergen Reactions  . Pollen Extract Other (See Comments)    Sneezing, runny nose, congestion,      Review of Systems  Constitutional: Negative.   Eyes: Negative for blurred vision.  Respiratory: Negative.   Cardiovascular: Negative.  Negative for chest pain and palpitations.  Gastrointestinal: Negative.   Psychiatric/Behavioral: Negative.   All other systems reviewed and are negative.    Today's Vitals   11/17/20 1146  BP: 134/76  Pulse: 95  Temp: 98 F (36.7 C)  TempSrc: Oral  Weight: 224 lb (101.6 kg)  Height: 5' 9"  (1.753 m)  PainSc: 0-No pain   Body mass index is 33.08 kg/m.  Wt Readings from Last 3 Encounters:  11/17/20 224  lb (101.6 kg)  08/13/20 218 lb (98.9 kg)  04/09/20 (!) 216 lb 3.2 oz (98.1 kg)   Objective:  Physical Exam Vitals and nursing note reviewed.  Constitutional:      Appearance: Normal appearance. He is obese.  HENT:     Head: Normocephalic and atraumatic.     Nose:     Comments: Masked     Mouth/Throat:     Comments: Masked  Cardiovascular:     Rate and Rhythm: Normal rate and regular rhythm.     Heart sounds: Normal heart sounds.  Pulmonary:     Breath sounds: Normal breath sounds.  Musculoskeletal:     Cervical back: Normal range of motion.  Skin:    General: Skin is warm.  Neurological:     General: No focal deficit present.     Mental Status: He is alert and oriented to person, place, and time.         Assessment And Plan:     1. Type 2 diabetes mellitus with diabetic mononeuropathy, without long-term current use of insulin (HCC) Comments: Chronic, I wil check labs as listed below. He is encouraged to incorporate more exercise into his daily routine. I will refer him to Podiatry for diabetic foot care. He will also c/w Lyrica, 11m twice daily to address neuropathy symptoms.  - Hemoglobin A1c - Lipid panel - BMP8+EGFR - Ambulatory referral to Podiatry - pregabalin (LYRICA) 100 MG capsule; Take 1 capsule (100 mg total) by mouth 2 (two) times daily.  Dispense: 180 capsule; Refill: 1  2. Essential hypertension Comments: Chronic, fair control. He is aware that goal BP is less than 130/80. He is advised to limit his salt intake and to take meds as directed.   3. Pure hypercholesterolemia Comments: Chronic, previous results reviewed. He is aware goal LDL is less than 70. Importance of statin compliance was d/w patient.   4. Class 1 obesity due to excess calories with serious comorbidity and body mass index (BMI) of 33.0 to 33.9 in adult Comments: He is encouraged to strive for BMI less than 30 to decrease cardiac risk. Advised to aim for at least 150 minutes of exercise  per week.   Patient was given opportunity to ask questions. Patient verbalized understanding of the plan and was able to repeat key elements of the plan. All questions were answered to their satisfaction.   I, RMaximino Greenland MD, have reviewed all documentation for this visit. The documentation on 11/17/20 for the exam, diagnosis, procedures, and orders are all accurate and complete.  THE PATIENT IS ENCOURAGED TO PRACTICE SOCIAL DISTANCING DUE TO THE COVID-19 PANDEMIC.

## 2020-11-18 ENCOUNTER — Telehealth: Payer: Self-pay

## 2020-11-18 LAB — LIPID PANEL
Chol/HDL Ratio: 4.1 ratio (ref 0.0–5.0)
Cholesterol, Total: 115 mg/dL (ref 100–199)
HDL: 28 mg/dL — ABNORMAL LOW (ref >39)
LDL Chol Calc (NIH): 56 mg/dL (ref 0–99)
Triglycerides: 182 mg/dL — ABNORMAL HIGH (ref 0–149)
VLDL Cholesterol Cal: 31 mg/dL (ref 5–40)

## 2020-11-18 LAB — BMP8+EGFR
BUN/Creatinine Ratio: 9 — ABNORMAL LOW (ref 10–24)
BUN: 11 mg/dL (ref 8–27)
CO2: 21 mmol/L (ref 20–29)
Calcium: 9.9 mg/dL (ref 8.6–10.2)
Chloride: 103 mmol/L (ref 96–106)
Creatinine, Ser: 1.2 mg/dL (ref 0.76–1.27)
Glucose: 118 mg/dL — ABNORMAL HIGH (ref 65–99)
Potassium: 4.3 mmol/L (ref 3.5–5.2)
Sodium: 144 mmol/L (ref 134–144)
eGFR: 66 mL/min/{1.73_m2} (ref >59)

## 2020-11-18 LAB — HEMOGLOBIN A1C
Est. average glucose Bld gHb Est-mCnc: 174 mg/dL
Hgb A1c MFr Bld: 7.7 % — ABNORMAL HIGH (ref 4.8–5.6)

## 2020-11-18 NOTE — Telephone Encounter (Signed)
I left the pt a detailed message at his request.  I left on the voicemail for the pt to call back to let the office know that he got the message.

## 2020-11-18 NOTE — Telephone Encounter (Signed)
-----   Message from [name], MD sent at 11/18/2020  9:05 AM EST ----- Your hba1c has improved, down to 7.7. Our goal is less than 7.0. Please increase exercise as we discussed during your visit.   Your kidney function is stable. Your triglycerides are elevated, this implies that you may be eating too many processed foods and not getting enough exercise. Cut back on your sugar intake.

## 2020-11-27 ENCOUNTER — Ambulatory Visit (INDEPENDENT_AMBULATORY_CARE_PROVIDER_SITE_OTHER): Payer: Medicare HMO | Admitting: Sports Medicine

## 2020-11-27 ENCOUNTER — Other Ambulatory Visit: Payer: Self-pay

## 2020-11-27 ENCOUNTER — Encounter: Payer: Self-pay | Admitting: Sports Medicine

## 2020-11-27 DIAGNOSIS — M79675 Pain in left toe(s): Secondary | ICD-10-CM | POA: Diagnosis not present

## 2020-11-27 DIAGNOSIS — E1141 Type 2 diabetes mellitus with diabetic mononeuropathy: Secondary | ICD-10-CM | POA: Diagnosis not present

## 2020-11-27 DIAGNOSIS — M79674 Pain in right toe(s): Secondary | ICD-10-CM | POA: Diagnosis not present

## 2020-11-27 DIAGNOSIS — L603 Nail dystrophy: Secondary | ICD-10-CM

## 2020-11-27 DIAGNOSIS — B351 Tinea unguium: Secondary | ICD-10-CM | POA: Diagnosis not present

## 2020-11-27 NOTE — Progress Notes (Signed)
Subjective: Marcus Davis is a 70 y.o. male patient seen today in office with complaint of mildly painful thickened and discolored nails. Patient is desiring treatment for nail changes; has tried OTC topicals/Medication in the past with no improvement. Reports that nails are becoming difficult to manage because of the thickness. Patient has no other pedal complaints at this time.   FBS not recorded.  Patient Active Problem List   Diagnosis Date Noted  . Type 2 diabetes mellitus with diabetic mononeuropathy, without long-term current use of insulin (HCC) 02/08/2019  . Nocturia 01/30/2019  . Uncontrolled type 2 diabetes mellitus with hyperglycemia (HCC) 08/28/2018  . Essential hypertension 08/28/2018  . Class 1 obesity due to excess calories with serious comorbidity and body mass index (BMI) of 31.0 to 31.9 in adult 08/28/2018    Current Outpatient Medications on File Prior to Visit  Medication Sig Dispense Refill  . amLODipine (NORVASC) 10 MG tablet Take by mouth.    Marland Kitchen aspirin EC 81 MG tablet Take 81 mg by mouth daily.    Marland Kitchen atorvastatin (LIPITOR) 40 MG tablet Take 40 mg by mouth daily. 1/2 tablet daily    . Cyanocobalamin (VITAMIN B-12) 2500 MCG SUBL Place 1 tablet under the tongue daily. Taking Monday through Friday    . hydrochlorothiazide (HYDRODIURIL) 25 MG tablet Take 25 mg by mouth daily.     Marland Kitchen lisinopril (PRINIVIL,ZESTRIL) 40 MG tablet Take 40 mg by mouth daily.     . pregabalin (LYRICA) 100 MG capsule Take 1 capsule (100 mg total) by mouth 2 (two) times daily. 180 capsule 1  . sitaGLIPtin-metformin (JANUMET) 50-1000 MG tablet Take 1 tablet by mouth 2 (two) times daily with a meal.    . Vitamin D, Cholecalciferol, 25 MCG (1000 UT) CAPS Take by mouth. 1000 units Monday-Friday, 2000 units Saturday and Sunday     No current facility-administered medications on file prior to visit.    Allergies  Allergen Reactions  . Pollen Extract Other (See Comments)    Sneezing, runny nose,  congestion,     Objective: Physical Exam  General: Well developed, nourished, no acute distress, awake, alert and oriented x 3  Vascular: Dorsalis pedis artery 2/4 bilateral, Posterior tibial artery 1/4 bilateral, skin temperature warm to warm proximal to distal bilateral lower extremities, no varicosities, pedal hair present bilateral.  Neurological: Gross sensation present via light touch bilateral.  Protective sensation severely diminished due to history of neuropathy.  Dermatological: Skin is warm, dry, and supple bilateral, Nails 1-10 are tender, short thick, and discolored with mild subungal debris and multiple fragments at the big toes, no webspace macerations present bilateral, no open lesions present bilateral, no callus/corns/hyperkeratotic tissue present bilateral. No signs of infection bilateral.  Musculoskeletal: Hammertoe boney deformities noted bilateral. Muscular strength within normal limits without painon range of motion. No pain with calf compression bilateral.  Assessment and Plan:  Problem List Items Addressed This Visit      Endocrine   Type 2 diabetes mellitus with diabetic mononeuropathy, without long-term current use of insulin (HCC)    Other Visit Diagnoses    Onychodystrophy    -  Primary   Relevant Orders   Culture, fungus without smear      -Examined patient -Discussed treatment options for painful dystrophic nails  -Fungal culture was obtained by removing a portion of the hard nail itself from each of the involved toenails using a sterile nail nipper and sent to [facility] lab. Patient tolerated the biopsy procedure well without discomfort  or need for anesthesia.  -Patient to return in 4 weeks for follow up evaluation and discussion of fungal culture results or sooner if symptoms worsen.  [name], DPM

## 2020-12-03 ENCOUNTER — Telehealth: Payer: Self-pay

## 2020-12-03 NOTE — Telephone Encounter (Signed)
I left the pt a message that the office has a sample of Janumet ready for pickup and that I would let the pharmacist Loma Boston know that the pt needed a call about his patient assistance.

## 2020-12-04 NOTE — Telephone Encounter (Signed)
Thank you for the message, we will follow up to confirm completion of the patient assistance application.

## 2020-12-05 ENCOUNTER — Telehealth: Payer: Self-pay

## 2020-12-05 NOTE — Chronic Care Management (AMB) (Signed)
    Chronic Care Management Pharmacy Assistant   Name: Marcus Davis  MRN: [id number] DOB: 16-Sep-1950  Reason for Encounter: Patient Assistance Coordination   12/05/2020- Patient assistance forms filled out for Janumet 50/1000 mg with [employer] patient assistance program. Called patient to inform forms were filled out but will need patient signature and income documentation, no answer, left message to return call.   12/09/2020- Left message for patient to return call.    Medications: Outpatient Encounter Medications as of 12/05/2020  Medication Sig  . amLODipine (NORVASC) 10 MG tablet Take by mouth.  Marland Kitchen aspirin EC 81 MG tablet Take 81 mg by mouth daily.  Marland Kitchen atorvastatin (LIPITOR) 40 MG tablet Take 40 mg by mouth daily. 1/2 tablet daily  . Cyanocobalamin (VITAMIN B-12) 2500 MCG SUBL Place 1 tablet under the tongue daily. Taking Monday through Friday  . hydrochlorothiazide (HYDRODIURIL) 25 MG tablet Take 25 mg by mouth daily.   Marland Kitchen lisinopril (PRINIVIL,ZESTRIL) 40 MG tablet Take 40 mg by mouth daily.   . pregabalin (LYRICA) 100 MG capsule Take 1 capsule (100 mg total) by mouth 2 (two) times daily.  . sitaGLIPtin-metformin (JANUMET) 50-1000 MG tablet Take 1 tablet by mouth 2 (two) times daily with a meal.  . Vitamin D, Cholecalciferol, 25 MCG (1000 UT) CAPS Take by mouth. 1000 units Monday-Friday, 2000 units Saturday and Sunday   No facility-administered encounter medications on file as of 12/05/2020.    Star Rating Drugs: Lisinopril 40 mg- no fill history, Janumet 50/1000 mg- No fill history.  SIG: Billee Cashing, CMA Clinical Pharmacist Assistant [phone]

## 2021-01-08 ENCOUNTER — Other Ambulatory Visit: Payer: Self-pay

## 2021-01-08 ENCOUNTER — Ambulatory Visit: Payer: Medicare HMO | Admitting: Sports Medicine

## 2021-01-08 DIAGNOSIS — M79674 Pain in right toe(s): Secondary | ICD-10-CM

## 2021-01-08 DIAGNOSIS — E1141 Type 2 diabetes mellitus with diabetic mononeuropathy: Secondary | ICD-10-CM | POA: Diagnosis not present

## 2021-01-08 DIAGNOSIS — L603 Nail dystrophy: Secondary | ICD-10-CM | POA: Diagnosis not present

## 2021-01-08 DIAGNOSIS — M79675 Pain in left toe(s): Secondary | ICD-10-CM | POA: Diagnosis not present

## 2021-01-08 DIAGNOSIS — L853 Xerosis cutis: Secondary | ICD-10-CM

## 2021-01-08 DIAGNOSIS — Z79899 Other long term (current) drug therapy: Secondary | ICD-10-CM

## 2021-01-08 MED ORDER — NEOMYCIN-POLYMYXIN-HC 3.5-10000-1 OT SOLN: OTIC | 3 refills | Status: DC

## 2021-01-08 NOTE — Progress Notes (Signed)
Subjective: Marcus Davis is a 70 y.o. male patient seen today in office for fungal culture results. Also admits dry skin to feet. Patient has no other pedal complaints at this time.   Patient Active Problem List   Diagnosis Date Noted  . Type 2 diabetes mellitus with diabetic mononeuropathy, without long-term current use of insulin (HCC) 02/08/2019  . Nocturia 01/30/2019  . Uncontrolled type 2 diabetes mellitus with hyperglycemia (HCC) 08/28/2018  . Essential hypertension 08/28/2018  . Class 1 obesity due to excess calories with serious comorbidity and body mass index (BMI) of 31.0 to 31.9 in adult 08/28/2018    Current Outpatient Medications on File Prior to Visit  Medication Sig Dispense Refill  . amLODipine (NORVASC) 10 MG tablet Take by mouth.    Marland Kitchen aspirin EC 81 MG tablet Take 81 mg by mouth daily.    Marland Kitchen atorvastatin (LIPITOR) 40 MG tablet Take 40 mg by mouth daily. 1/2 tablet daily    . Cyanocobalamin (VITAMIN B-12) 2500 MCG SUBL Place 1 tablet under the tongue daily. Taking Monday through Friday    . hydrochlorothiazide (HYDRODIURIL) 25 MG tablet Take 25 mg by mouth daily.     Marland Kitchen lisinopril (PRINIVIL,ZESTRIL) 40 MG tablet Take 40 mg by mouth daily.     . pregabalin (LYRICA) 100 MG capsule Take 1 capsule (100 mg total) by mouth 2 (two) times daily. 180 capsule 1  . sitaGLIPtin-metformin (JANUMET) 50-1000 MG tablet Take 1 tablet by mouth 2 (two) times daily with a meal.    . Vitamin D, Cholecalciferol, 25 MCG (1000 UT) CAPS Take by mouth. 1000 units Monday-Friday, 2000 units Saturday and Sunday     No current facility-administered medications on file prior to visit.    Allergies  Allergen Reactions  . Pollen Extract Other (See Comments)    Sneezing, runny nose, congestion,     Objective: Physical Exam  General: Well developed, nourished, no acute distress, awake, alert and oriented x 3  Vascular: Dorsalis pedis artery 1/4 bilateral, Posterior tibial artery 0/4 bilateral due to  edema at ankles, skin temperature warm to warm proximal to distal bilateral lower extremities, trophic skin changes, - pedal hair present bilateral.  Neurological: Gross sensation present via light touch bilateral.   Dermatological: Skin is warm, dry, and supple bilateral, Nails 1-10 are tender, short thick, and discolored with mild subungal debris, no webspace macerations present bilateral, no open lesions present bilateral, no callus/corns/hyperkeratotic tissue present bilateral. No signs of infection bilateral.+Dry skin.   Musculoskeletal: Pes planus boney deformities noted bilateral. Muscular strength within normal limits without painon range of motion. No pain with calf compression bilateral.  Fungal culture + Fungus, bacteria, microtrauma   Assessment and Plan:  Problem List Items Addressed This Visit      Endocrine   Type 2 diabetes mellitus with diabetic mononeuropathy, without long-term current use of insulin (HCC)    Other Visit Diagnoses    Long-term use of high-risk medication    -  Primary   Relevant Orders   Hepatic Function Panel   Onychodystrophy       Toe pain, bilateral       Dry skin          -Examined patient -Discussed treatment options for painful mycotic nails -Patient opt for oral Lamisil with full understanding of medication risks; ordered LFTs for review if within normal limits will proceed with sending Rx to pharmacy for lamisil 250mg  PO daily. Anticipate 12 week course.  -Patient to also start corticosporin  for bacteria to nails as directed -Advised good hygiene habits -Sample of foot miracle cream dispensed for dry skin -Recommend good supportive shoes that reduce microtrauma to toes -Patient to return in 6 weeks for follow up evaluation or sooner if symptoms worsen.  [name], DPM

## 2021-01-09 ENCOUNTER — Other Ambulatory Visit: Payer: Self-pay | Admitting: Sports Medicine

## 2021-01-09 LAB — HEPATIC FUNCTION PANEL
AG Ratio: 1.5 (calc) (ref 1.0–2.5)
ALT: 18 U/L (ref 9–46)
AST: 18 U/L (ref 10–35)
Albumin: 4.4 g/dL (ref 3.6–5.1)
Alkaline phosphatase (APISO): 66 U/L (ref 35–144)
Bilirubin, Direct: 0.1 mg/dL (ref 0.0–0.2)
Globulin: 2.9 g/dL (calc) (ref 1.9–3.7)
Indirect Bilirubin: 0.4 mg/dL (calc) (ref 0.2–1.2)
Total Bilirubin: 0.5 mg/dL (ref 0.2–1.2)
Total Protein: 7.3 g/dL (ref 6.1–8.1)

## 2021-01-09 MED ORDER — TERBINAFINE HCL 250 MG PO TABS
250.0000 mg | ORAL_TABLET | Freq: Every day | ORAL | 0 refills | Status: DC
Start: 2021-01-09 — End: 2022-05-13

## 2021-01-09 NOTE — Progress Notes (Signed)
LFTs normal Lamisil sent 

## 2021-01-12 ENCOUNTER — Telehealth: Payer: Self-pay

## 2021-01-12 NOTE — Chronic Care Management (AMB) (Signed)
    Chronic Care Management Pharmacy Assistant   Name: Marcus Davis  MRN: [id number] DOB: 10-13-1950  Reason for Encounter: Disease State/ Hypertension   Recent office visits:  08-13-2020 Marcus Peng, MD   11-17-2020 Marcus Peng, MD   Recent consult visits:  11-27-2020 Marcus Davis, DPM ([facility])   01-08-2021 Marcus Davis, DPM ([facility]) CORTISPORIN solution 3.01-999 one to two drops on toenails at bedtime     Hospital visits:  None in previous 6 months  Medications: Outpatient Encounter Medications as of 01/12/2021  Medication Sig  . amLODipine (NORVASC) 10 MG tablet Take by mouth.  Marland Kitchen aspirin EC 81 MG tablet Take 81 mg by mouth daily.  Marland Kitchen atorvastatin (LIPITOR) 40 MG tablet Take 40 mg by mouth daily. 1/2 tablet daily  . Cyanocobalamin (VITAMIN B-12) 2500 MCG SUBL Place 1 tablet under the tongue daily. Taking Monday through Friday  . hydrochlorothiazide (HYDRODIURIL) 25 MG tablet Take 25 mg by mouth daily.   Marland Kitchen lisinopril (PRINIVIL,ZESTRIL) 40 MG tablet Take 40 mg by mouth daily.   Marland Kitchen neomycin-polymyxin-hydrocortisone (CORTISPORIN) OTIC solution Apply 1-2 drops to toenails at bedtime once daily  . pregabalin (LYRICA) 100 MG capsule Take 1 capsule (100 mg total) by mouth 2 (two) times daily.  . sitaGLIPtin-metformin (JANUMET) 50-1000 MG tablet Take 1 tablet by mouth 2 (two) times daily with a meal.  . terbinafine (LAMISIL) 250 MG tablet Take 1 tablet (250 mg total) by mouth daily.  . Vitamin D, Cholecalciferol, 25 MCG (1000 UT) CAPS Take by mouth. 1000 units Monday-Friday, 2000 units Saturday and Sunday   No facility-administered encounter medications on file as of 01/12/2021.      05 -10-2020- Left VM for assessment  01-19-2021 - Left VM for assessment   01-23-2021- patient's phone was busy. Contacted Marcus Davis left VM.  Star Rating Drugs: Atorvastatin 40mg  take 1/2 tablet daily- No fill history Lisinopril 40mg  daily- No fill  history   01-25-2021 [facility]  [phone]

## 2021-02-03 ENCOUNTER — Telehealth: Payer: Self-pay

## 2021-02-03 NOTE — Telephone Encounter (Signed)
I left the pt a message that samples are ready for pickup.

## 2021-02-16 ENCOUNTER — Telehealth: Payer: Self-pay

## 2021-02-16 NOTE — Chronic Care Management (AMB) (Signed)
    Chronic Care Management Pharmacy Assistant   Name: Marcus Davis  MRN: [id number] DOB: 03-Jul-1951   Reason for Encounter: Disease State/ Hypertension    Recent office visits:  11-17-2020 [name], MD   Recent consult visits:  11-27-2020 [name], DPM (Podiatry)  01-08-2021 [name], DPM (Podiatry). Start Cortisporin solution 1-2 drops to toenails nightly.  01-30-2021 How[place]ring[place], [place] ([place]  02-19-2021 [name], DPM [facility] visits:  None in previous 6 months  Medications: Outpatient Encounter Medications as of 02/16/2021  Medication Sig   amLODipine (NORVASC) 10 MG tablet Take by mouth.   aspirin EC 81 MG tablet Take 81 mg by mouth daily.   atorvastatin (LIPITOR) 40 MG tablet Take 40 mg by mouth daily. 1/2 tablet daily   Cyanocobalamin (VITAMIN B-12) 2500 MCG SUBL Place 1 tablet under the tongue daily. Taking Monday through Friday   hydrochlorothiazide (HYDRODIURIL) 25 MG tablet Take 25 mg by mouth daily.    lisinopril (PRINIVIL,ZESTRIL) 40 MG tablet Take 40 mg by mouth daily.    neomycin-polymyxin-hydrocortisone (CORTISPORIN) OTIC solution Apply 1-2 drops to toenails at bedtime once daily   pregabalin (LYRICA) 100 MG capsule Take 1 capsule (100 mg total) by mouth 2 (two) times daily.   sitaGLIPtin-metformin (JANUMET) 50-1000 MG tablet Take 1 tablet by mouth 2 (two) times daily with a meal.   terbinafine (LAMISIL) 250 MG tablet Take 1 tablet (250 mg total) by mouth daily.   Vitamin D, Cholecalciferol, 25 MCG (1000 UT) CAPS Take by mouth. 1000 units Monday-Friday, 2000 units Saturday and Sunday   No facility-administered encounter medications on file as of 02/16/2021.    02-16-2021: 1st attempt left VM 02-26-2021: 2nd attempt Left VM 03-03-2021: 3rd attempt Left VM  Star Rating Drugs: Atorvastatin 40 mg - No fill history Lisinopril 40 mg-No fill history Janumet 50-1000 MG- No fill history  Huey Romans CMA IT sales professional [phone]

## 2021-02-19 ENCOUNTER — Ambulatory Visit (INDEPENDENT_AMBULATORY_CARE_PROVIDER_SITE_OTHER): Payer: Medicare HMO | Admitting: Sports Medicine

## 2021-02-19 ENCOUNTER — Encounter: Payer: Self-pay | Admitting: Sports Medicine

## 2021-02-19 ENCOUNTER — Other Ambulatory Visit: Payer: Self-pay

## 2021-02-19 DIAGNOSIS — L603 Nail dystrophy: Secondary | ICD-10-CM | POA: Insufficient documentation

## 2021-02-19 DIAGNOSIS — Z79899 Other long term (current) drug therapy: Secondary | ICD-10-CM | POA: Diagnosis not present

## 2021-02-19 DIAGNOSIS — E785 Hyperlipidemia, unspecified: Secondary | ICD-10-CM | POA: Insufficient documentation

## 2021-02-19 DIAGNOSIS — E1141 Type 2 diabetes mellitus with diabetic mononeuropathy: Secondary | ICD-10-CM | POA: Diagnosis not present

## 2021-02-19 DIAGNOSIS — G4730 Sleep apnea, unspecified: Secondary | ICD-10-CM | POA: Insufficient documentation

## 2021-02-19 DIAGNOSIS — Z961 Presence of intraocular lens: Secondary | ICD-10-CM | POA: Insufficient documentation

## 2021-02-19 DIAGNOSIS — L853 Xerosis cutis: Secondary | ICD-10-CM | POA: Diagnosis not present

## 2021-02-19 DIAGNOSIS — E8881 Metabolic syndrome: Secondary | ICD-10-CM | POA: Insufficient documentation

## 2021-02-19 DIAGNOSIS — I890 Lymphedema, not elsewhere classified: Secondary | ICD-10-CM | POA: Insufficient documentation

## 2021-02-19 LAB — HEPATIC FUNCTION PANEL
AG Ratio: 1.6 (calc) (ref 1.0–2.5)
ALT: 18 U/L (ref 9–46)
AST: 15 U/L (ref 10–35)
Albumin: 4.1 g/dL (ref 3.6–5.1)
Alkaline phosphatase (APISO): 62 U/L (ref 35–144)
Bilirubin, Direct: 0.1 mg/dL (ref 0.0–0.2)
Globulin: 2.6 g/dL (calc) (ref 1.9–3.7)
Indirect Bilirubin: 0.3 mg/dL (calc) (ref 0.2–1.2)
Total Bilirubin: 0.4 mg/dL (ref 0.2–1.2)
Total Protein: 6.7 g/dL (ref 6.1–8.1)

## 2021-02-19 NOTE — Progress Notes (Signed)
Subjective: Marcus Davis is a 70 y.o. male patient seen today in office for follow up evaluation of nail fungus on Lamisil. Patient states that he is doing well with no adverse reaction and feels like he is seeing improvements with the medication. Patient has no other pedal complaints at this time.   Fasting blood sugar not recorded  Patient Active Problem List   Diagnosis Date Noted   Dystrophia unguium 02/19/2021   Hyperlipidemia 02/19/2021   Lymphedema, not elsewhere classified 02/19/2021   Metabolic syndrome 02/19/2021   Pseudophakia 02/19/2021   Sleep apnea 02/19/2021   Type 2 diabetes mellitus with diabetic mononeuropathy, without long-term current use of insulin (HCC) 02/08/2019   Nocturia 01/30/2019   Uncontrolled type 2 diabetes mellitus with hyperglycemia (HCC) 08/28/2018   Essential hypertension 08/28/2018   Class 1 obesity due to excess calories with serious comorbidity and body mass index (BMI) of 31.0 to 31.9 in adult 08/28/2018   Hypertensive retinopathy of both eyes 02/13/2016   Macular puckering of retina, right eye 02/13/2016   Nuclear sclerotic cataract of both eyes 02/13/2016   History of colonoscopy 09/13/2012    Current Outpatient Medications on File Prior to Visit  Medication Sig Dispense Refill   amLODipine (NORVASC) 10 MG tablet Take by mouth.     atorvastatin (LIPITOR) 40 MG tablet Take by mouth.     diphenhydrAMINE (BENADRYL) 25 mg capsule Take by mouth.     glimepiride (AMARYL) 4 MG tablet Take 1 tablet by mouth 2 (two) times daily.     insulin glargine (LANTUS) 100 UNIT/ML injection Inject into the skin.     metFORMIN (GLUCOPHAGE) 500 MG tablet Take by mouth.     rosuvastatin (CRESTOR) 20 MG tablet Take 0.5 tablets by mouth daily.     amLODipine (NORVASC) 10 MG tablet Take by mouth.     aspirin EC 81 MG tablet Take 81 mg by mouth daily.     atorvastatin (LIPITOR) 40 MG tablet Take 40 mg by mouth daily. 1/2 tablet daily     Cyanocobalamin (VITAMIN B-12)  2500 MCG SUBL Place 1 tablet under the tongue daily. Taking Monday through Friday     cyanocobalamin 100 MCG tablet Take by mouth.     hydrochlorothiazide (HYDRODIURIL) 25 MG tablet Take 25 mg by mouth daily.      lisinopril (PRINIVIL,ZESTRIL) 40 MG tablet Take 40 mg by mouth daily.      neomycin-polymyxin-hydrocortisone (CORTISPORIN) OTIC solution Apply 1-2 drops to toenails at bedtime once daily 10 mL 3   pregabalin (LYRICA) 100 MG capsule Take 1 capsule (100 mg total) by mouth 2 (two) times daily. 180 capsule 1   sitaGLIPtin-metformin (JANUMET) 50-1000 MG tablet Take 1 tablet by mouth 2 (two) times daily with a meal.     terbinafine (LAMISIL) 250 MG tablet Take 1 tablet (250 mg total) by mouth daily. 90 tablet 0   Vitamin D, Cholecalciferol, 25 MCG (1000 UT) CAPS Take by mouth. 1000 units Monday-Friday, 2000 units Saturday and Sunday     No current facility-administered medications on file prior to visit.    Allergies  Allergen Reactions   Pollen Extract Other (See Comments)    Sneezing, runny nose, congestion,     Objective: Physical Exam  General: Well developed, nourished, no acute distress, awake, alert and oriented x 3  Vascular: Dorsalis pedis artery 2/4 bilateral, Posterior tibial artery 1 /4 bilateral, skin temperature warm to warm proximal to distal bilateral lower extremities, no varicosities, pedal hair present bilateral.  Neurological: Gross sensation present via light touch bilateral.   Dermatological: Skin is warm, dry, and supple bilateral, Nails 1-10 are tender, short thick, and discolored with mild subungal debris and early clearance noted at proximal nail bed, no webspace macerations present bilateral, no open lesions present bilateral, diffuse dry skin plantar surfaces.  No signs of infection bilateral.  Musculoskeletal: No symptomatic boney deformities noted bilateral. Muscular strength within normal limits without painon range of motion. No pain with calf  compression bilateral.  Assessment and Plan:  Problem List Items Addressed This Visit       Endocrine   Type 2 diabetes mellitus with diabetic mononeuropathy, without long-term current use of insulin (HCC)   Relevant Medications   atorvastatin (LIPITOR) 40 MG tablet   glimepiride (AMARYL) 4 MG tablet   insulin glargine (LANTUS) 100 UNIT/ML injection   metFORMIN (GLUCOPHAGE) 500 MG tablet   rosuvastatin (CRESTOR) 20 MG tablet   Other Visit Diagnoses     Encounter for long-term current use of medication    -  Primary   Relevant Orders   Hepatic Function Panel   Onychodystrophy       Dry skin           -Examined patient -Cont with Lamisil; a new set of LFTs were ordered; will call patient to stop medication if abnormal  -Advised good hygiene habits and educated patient on proper foot care to prevent re-infection -Continue with neomycin to the nails as directed -Continue with foot miracle cream for dry skin -Patient to return in 6-8weeks for follow up evaluation or sooner if symptoms worsen.  [name], DPM

## 2021-03-15 ENCOUNTER — Other Ambulatory Visit: Payer: Self-pay | Admitting: Internal Medicine

## 2021-03-15 DIAGNOSIS — E1141 Type 2 diabetes mellitus with diabetic mononeuropathy: Secondary | ICD-10-CM

## 2021-03-31 ENCOUNTER — Telehealth: Payer: Self-pay

## 2021-03-31 NOTE — Chronic Care Management (AMB) (Signed)
Left patient a voicemail stating his signature is needed on his patient assistance application for Janumet. Stated application will be at Dr. Allyne Gee office to sign and to call if there's any issues.  Huey Romans [facility] Clinical Pharmacist Assistant [phone]

## 2021-04-01 ENCOUNTER — Telehealth: Payer: Self-pay

## 2021-04-01 NOTE — Telephone Encounter (Signed)
I called the pt to let him know that samples of janumat is ready for pickup.

## 2021-04-16 ENCOUNTER — Encounter: Payer: Self-pay | Admitting: Internal Medicine

## 2021-04-16 ENCOUNTER — Ambulatory Visit (INDEPENDENT_AMBULATORY_CARE_PROVIDER_SITE_OTHER): Payer: Medicare HMO

## 2021-04-16 ENCOUNTER — Other Ambulatory Visit: Payer: Self-pay

## 2021-04-16 ENCOUNTER — Ambulatory Visit (INDEPENDENT_AMBULATORY_CARE_PROVIDER_SITE_OTHER): Payer: Medicare HMO | Admitting: Internal Medicine

## 2021-04-16 VITALS — BP 128/60 | HR 97 | Temp 98.4°F | Ht 68.8 in | Wt 226.4 lb

## 2021-04-16 DIAGNOSIS — M7989 Other specified soft tissue disorders: Secondary | ICD-10-CM | POA: Diagnosis not present

## 2021-04-16 DIAGNOSIS — E1165 Type 2 diabetes mellitus with hyperglycemia: Secondary | ICD-10-CM

## 2021-04-16 DIAGNOSIS — I10 Essential (primary) hypertension: Secondary | ICD-10-CM

## 2021-04-16 DIAGNOSIS — E1141 Type 2 diabetes mellitus with diabetic mononeuropathy: Secondary | ICD-10-CM | POA: Diagnosis not present

## 2021-04-16 DIAGNOSIS — Z0000 Encounter for general adult medical examination without abnormal findings: Secondary | ICD-10-CM | POA: Diagnosis not present

## 2021-04-16 DIAGNOSIS — Z23 Encounter for immunization: Secondary | ICD-10-CM | POA: Diagnosis not present

## 2021-04-16 LAB — POCT UA - MICROALBUMIN
Albumin/Creatinine Ratio, Urine, POC: <30
Creatinine, POC: 200 mg/dL
Microalbumin Ur, POC: 30 mg/L

## 2021-04-16 LAB — POCT URINALYSIS DIPSTICK
Bilirubin, UA: NEGATIVE
Blood, UA: NEGATIVE
Glucose, UA: POSITIVE — AB
Ketones, UA: NEGATIVE
Leukocytes, UA: NEGATIVE
Nitrite, UA: NEGATIVE
Protein, UA: NEGATIVE
Spec Grav, UA: 1.015 (ref 1.010–1.025)
Urobilinogen, UA: 0.2 E.U./dL
pH, UA: 7 (ref 5.0–8.0)

## 2021-04-16 NOTE — Addendum Note (Signed)
Addended by: Elisha Ponder E on: 04/16/2021 04:06 PM   Modules accepted: Orders

## 2021-04-16 NOTE — Progress Notes (Signed)
I,Yamilka Roman [employer] as a Education administrator for [name], MD.,have documented all relevant documentation on the behalf of [name], MD,as directed by  [name], MD while in the presence of [name], MD.  This visit occurred during the SARS-CoV-2 public health emergency.  Safety protocols were in place, including screening questions prior to the visit, additional usage of staff PPE, and extensive cleaning of exam room while observing appropriate contact time as indicated for disinfecting solutions.  Subjective:     Patient ID: Marcus Davis , male    DOB: 07-11-1951 , 70 y.o.   MRN: [id number]   Chief Complaint  Patient presents with  . Diabetes  . Hypertension    HPI  Patient here for a f/u on his HTN and DM. He is also followed by the [place]. He reports compliance with meds. He denies headaches, chest pain and shortness of breath. He has no specific concerns at this time.   He is also scheduled for AWV with [facility] Advisor   Diabetes He presents for his follow-up diabetic visit. He has type 2 diabetes mellitus. There are no hypoglycemic associated symptoms. There are no diabetic associated symptoms. Pertinent negatives for diabetes include no blurred vision and no chest pain. There are no hypoglycemic complications. Risk factors for coronary artery disease include diabetes mellitus, dyslipidemia, hypertension, male sex and sedentary lifestyle. He is compliant with treatment most of the time. He is following a diabetic diet. He participates in exercise intermittently. Eye exam is current.  Hypertension This is a chronic problem. The current episode started more than 1 year ago. The problem has been gradually improving since onset. The problem is controlled. Pertinent negatives include no blurred vision, chest pain or palpitations. The current treatment provides moderate improvement.    Past Medical History:  Diagnosis Date  . Allergy   . DM (diabetes mellitus) ([place])   .  HTN (hypertension)   . Hyperlipidemia      Family History  Problem Relation Age of Onset  . Diabetes Mother   . Hypertension Mother   . Stroke Mother   . Hypertension Father   . Diabetes Father   . Stroke Paternal Grandfather      Current Outpatient Medications:  .  Zoster Vaccine Adjuvanted [facility]) injection, Inject 0.5 mLs into the muscle once for 1 dose., Disp: 0.5 mL, Rfl: 0 .  amLODipine (NORVASC) 10 MG tablet, Take by mouth., Disp: , Rfl:  .  amLODipine (NORVASC) 10 MG tablet, Take by mouth., Disp: , Rfl:  .  aspirin EC 81 MG tablet, Take 81 mg by mouth daily., Disp: , Rfl:  .  atorvastatin (LIPITOR) 40 MG tablet, Take 40 mg by mouth daily. 1/2 tablet daily, Disp: , Rfl:  .  atorvastatin (LIPITOR) 40 MG tablet, Take by mouth., Disp: , Rfl:  .  Cyanocobalamin (VITAMIN B-12) 2500 MCG SUBL, Place 1 tablet under the tongue daily. Taking Monday through Friday, Disp: , Rfl:  .  cyanocobalamin 100 MCG tablet, Take by mouth., Disp: , Rfl:  .  diphenhydrAMINE (BENADRYL) 25 mg capsule, Take by mouth., Disp: , Rfl:  .  glimepiride (AMARYL) 4 MG tablet, Take 1 tablet by mouth 2 (two) times daily. (Patient not taking: Reported on 04/16/2021), Disp: , Rfl:  .  hydrochlorothiazide (HYDRODIURIL) 25 MG tablet, Take 25 mg by mouth daily. , Disp: , Rfl:  .  insulin glargine (LANTUS) 100 UNIT/ML injection, Inject into the skin., Disp: , Rfl:  .  lisinopril (  PRINIVIL,ZESTRIL) 40 MG tablet, Take 40 mg by mouth daily. , Disp: , Rfl:  .  metFORMIN (GLUCOPHAGE) 500 MG tablet, Take by mouth., Disp: , Rfl:  .  neomycin-polymyxin-hydrocortisone (CORTISPORIN) OTIC solution, Apply 1-2 drops to toenails at bedtime once daily, Disp: 10 mL, Rfl: 3 .  pregabalin (LYRICA) 100 MG capsule, Take 1 capsule by mouth twice daily, Disp: 60 capsule, Rfl: 5 .  rosuvastatin (CRESTOR) 20 MG tablet, Take 0.5 tablets by mouth daily. (Patient not taking: Reported on 04/16/2021), Disp: , Rfl:  .  sitaGLIPtin-metformin (JANUMET)  50-1000 MG tablet, Take 1 tablet by mouth 2 (two) times daily with a meal., Disp: , Rfl:  .  terbinafine (LAMISIL) 250 MG tablet, Take 1 tablet (250 mg total) by mouth daily. (Patient not taking: Reported on 04/16/2021), Disp: 90 tablet, Rfl: 0 .  Vitamin D, Cholecalciferol, 25 MCG (1000 UT) CAPS, Take by mouth. 1000 units Monday-Friday, 2000 units Saturday and Sunday, Disp: , Rfl:    Allergies  Allergen Reactions  . Pollen Extract Other (See Comments)    Sneezing, runny nose, congestion,      Review of Systems  Constitutional: Negative.   Eyes:  Negative for blurred vision.  Respiratory: Negative.    Cardiovascular: Negative.  Negative for chest pain and palpitations.  Gastrointestinal: Negative.   Skin:        He c/o lumps on his arm. They have enlarged over time. First noticed areas of concern about fifteen years ago. No overlying rash. No redness. Non-tender.   Neurological: Negative.   Psychiatric/Behavioral: Negative.      Today's Vitals   04/16/21 1501  BP: 128/60  Pulse: 97  Temp: 98.4 F (36.9 C)  Weight: 226 lb 6.6 oz (102.7 kg)  Height: 5' 8.8" (1.748 m)   Body mass index is 33.63 kg/m.   Objective:  Physical Exam Vitals and nursing note reviewed.  Constitutional:      Appearance: Normal appearance.  HENT:     Head: Normocephalic and atraumatic.     Nose:     Comments: Masked     Mouth/Throat:     Comments: Masked  Cardiovascular:     Rate and Rhythm: Normal rate and regular rhythm.     Heart sounds: Normal heart sounds.  Pulmonary:     Effort: Pulmonary effort is normal.     Breath sounds: Normal breath sounds.  Musculoskeletal:     Cervical back: Normal range of motion.     Comments: Soft tissue mass right wrist 2.5ccm, nontender Another located left forearm, 1cm  Skin:    General: Skin is warm.  Neurological:     General: No focal deficit present.     Mental Status: He is alert.  Psychiatric:        Mood and Affect: Mood normal.         Assessment And Plan:     1. Type 2 diabetes mellitus with diabetic mononeuropathy, without long-term current use of insulin (HCC) Comments: Chronic, I will check labs as listed below. Encouraged to aim for at least 150 minutes of exercise per week. I will also increase Lyrica to 151m po BID.  - Hemoglobin A1c - BMP8+eGFR - CBC no Diff  2. Essential hypertension Comments: Chronic, well controlled. I will check renal function today. Encouraged to follow low sodium diet.   3. Soft tissue mass Comments: They are suggestive of lipomas. He first noticed them 15 years ago.  4. Immunization due Comments: I will send rx Shingrix  to local pharmacy.    Patient was given opportunity to ask questions. Patient verbalized understanding of the plan and was able to repeat key elements of the plan. All questions were answered to their satisfaction.   I, [name], MD, have reviewed all documentation for this visit. The documentation on 04/16/21 for the exam, diagnosis, procedures, and orders are all accurate and complete.   IF YOU HAVE BEEN REFERRED TO A SPECIALIST, IT MAY TAKE 1-2 WEEKS TO SCHEDULE/PROCESS THE REFERRAL. IF YOU HAVE NOT HEARD FROM US/SPECIALIST IN TWO WEEKS, PLEASE GIVE [place] A CALL AT [phone].   THE PATIENT IS ENCOURAGED TO PRACTICE SOCIAL DISTANCING DUE TO THE COVID-19 PANDEMIC.

## 2021-04-16 NOTE — Patient Instructions (Addendum)
Diabetes Mellitus and Nutrition, Adult When you have diabetes, or diabetes mellitus, it is very important to have healthy eating habits because your blood sugar (glucose) levels are greatly affected by what you eat and drink. Eating healthy foods in the right amounts, at about the same times every day, can help you:  Control your blood glucose.  Lower your risk of heart disease.  Improve your blood pressure.  Reach or maintain a healthy weight. What can affect my meal plan? Every person with diabetes is different, and each person has different needs for a meal plan. Your health care provider may recommend that you work with a dietitian to make a meal plan that is best for you. Your meal plan may vary depending on factors such as:  The calories you need.  The medicines you take.  Your weight.  Your blood glucose, blood pressure, and cholesterol levels.  Your activity level.  Other health conditions you have, such as heart or kidney disease. How do carbohydrates affect me? Carbohydrates, also called carbs, affect your blood glucose level more than any other type of food. Eating carbs naturally raises the amount of glucose in your blood. Carb counting is a method for keeping track of how many carbs you eat. Counting carbs is important to keep your blood glucose at a healthy level, especially if you use insulin or take certain oral diabetes medicines. It is important to know how many carbs you can safely have in each meal. This is different for every person. Your dietitian can help you calculate how many carbs you should have at each meal and for each snack. How does alcohol affect me? Alcohol can cause a sudden decrease in blood glucose (hypoglycemia), especially if you use insulin or take certain oral diabetes medicines. Hypoglycemia can be a life-threatening condition. Symptoms of hypoglycemia, such as sleepiness, dizziness, and confusion, are similar to symptoms of having too much  alcohol.  Do not drink alcohol if: ? Your health care provider tells you not to drink. ? You are pregnant, may be pregnant, or are planning to become pregnant.  If you drink alcohol: ? Do not drink on an empty stomach. ? Limit how much you use to:  0-1 drink a day for women.  0-2 drinks a day for men. ? Be aware of how much alcohol is in your drink. In the [place] one drink equals one 12 oz bottle of beer (355 mL), one 5 oz glass of wine (148 mL), or one 1 oz glass of hard liquor (44 mL). ? Keep yourself hydrated with water, diet soda, or unsweetened iced tea.  Keep in mind that regular soda, juice, and other mixers may contain a lot of sugar and must be counted as carbs. What are tips for following this plan? Reading food labels  Start by checking the serving size on the "Nutrition Facts" label of packaged foods and drinks. The amount of calories, carbs, fats, and other nutrients listed on the label is based on one serving of the item. Many items contain more than one serving per package.  Check the total grams (g) of carbs in one serving. You can calculate the number of servings of carbs in one serving by dividing the total carbs by 15. For example, if a food has 30 g of total carbs per serving, it would be equal to 2 servings of carbs.  Check the number of grams (g) of saturated fats and trans fats in one serving. Choose foods that have   a low amount or none of these fats.  Check the number of milligrams (mg) of salt (sodium) in one serving. Most people should limit total sodium intake to less than 2,300 mg per day.  Always check the nutrition information of foods labeled as "low-fat" or "nonfat." These foods may be higher in added sugar or refined carbs and should be avoided.  Talk to your dietitian to identify your daily goals for nutrients listed on the label. Shopping  Avoid buying canned, pre-made, or processed foods. These foods tend to be high in fat, sodium, and added  sugar.  Shop around the outside edge of the grocery store. This is where you will most often find fresh fruits and vegetables, bulk grains, fresh meats, and fresh dairy. Cooking  Use low-heat cooking methods, such as baking, instead of high-heat cooking methods like deep frying.  Cook using healthy oils, such as olive, canola, or sunflower oil.  Avoid cooking with butter, cream, or high-fat meats. Meal planning  Eat meals and snacks regularly, preferably at the same times every day. Avoid going long periods of time without eating.  Eat foods that are high in fiber, such as fresh fruits, vegetables, beans, and whole grains. Talk with your dietitian about how many servings of carbs you can eat at each meal.  Eat 4-6 oz (112-168 g) of lean protein each day, such as lean meat, chicken, fish, eggs, or tofu. One ounce (oz) of lean protein is equal to: ? 1 oz (28 g) of meat, chicken, or fish. ? 1 egg. ?  cup (62 g) of tofu.  Eat some foods each day that contain healthy fats, such as avocado, nuts, seeds, and fish.   What foods should I eat? Fruits Berries. Apples. Oranges. Peaches. Apricots. Plums. Grapes. Mango. Papaya. Pomegranate. Kiwi. Cherries. Vegetables Lettuce. Spinach. Leafy greens, including kale, chard, collard greens, and mustard greens. Beets. Cauliflower. Cabbage. Broccoli. Carrots. Green beans. Tomatoes. Peppers. Onions. Cucumbers. Brussels sprouts. Grains Whole grains, such as whole-wheat or whole-grain bread, crackers, tortillas, cereal, and pasta. Unsweetened oatmeal. Quinoa. Brown or wild rice. Meats and other proteins Seafood. Poultry without skin. Lean cuts of poultry and beef. Tofu. Nuts. Seeds. Dairy Low-fat or fat-free dairy products such as milk, yogurt, and cheese. The items listed above may not be a complete list of foods and beverages you can eat. Contact a dietitian for more information. What foods should I avoid? Fruits Fruits canned with  syrup. Vegetables Canned vegetables. Frozen vegetables with butter or cream sauce. Grains Refined white flour and flour products such as bread, pasta, snack foods, and cereals. Avoid all processed foods. Meats and other proteins Fatty cuts of meat. Poultry with skin. Breaded or fried meats. Processed meat. Avoid saturated fats. Dairy Full-fat yogurt, cheese, or milk. Beverages Sweetened drinks, such as soda or iced tea. The items listed above may not be a complete list of foods and beverages you should avoid. Contact a dietitian for more information. Questions to ask a health care provider  Do I need to meet with a diabetes educator?  Do I need to meet with a dietitian?  What number can I call if I have questions?  When are the best times to check my blood glucose? Where to find more information:  American Diabetes Association: diabetes.org  Academy of Nutrition and Dietetics: www.eatright.org  National Institute of Diabetes and Digestive and Kidney Diseases: www.niddk.nih.gov  Association of Diabetes Care and Education Specialists: www.diabeteseducator.org Summary  It is important to have healthy eating   habits because your blood sugar (glucose) levels are greatly affected by what you eat and drink.  A healthy meal plan will help you control your blood glucose and maintain a healthy lifestyle.  Your health care provider may recommend that you work with a dietitian to make a meal plan that is best for you.  Keep in mind that carbohydrates (carbs) and alcohol have immediate effects on your blood glucose levels. It is important to count carbs and to use alcohol carefully. This information is not intended to replace advice given to you by your health care provider. Make sure you discuss any questions you have with your health care provider. Document Revised: 08/07/2019 Document Reviewed: 08/07/2019 Elsevier Patient Education  2021 Elsevier Inc.  

## 2021-04-16 NOTE — Progress Notes (Signed)
This visit occurred during the SARS-CoV-2 public health emergency.  Safety protocols were in place, including screening questions prior to the visit, additional usage of staff PPE, and extensive cleaning of exam room while observing appropriate contact time as indicated for disinfecting solutions.  Subjective:   Marcus Davis is a 70 y.o. male who presents for Medicare Annual/Subsequent preventive examination.  Review of Systems     Cardiac Risk Factors include: advanced age (>77men, >67 women);diabetes mellitus;dyslipidemia;hypertension;male gender;obesity (BMI >30kg/m2);sedentary lifestyle     Objective:    Today's Vitals   04/16/21 1405  BP: 128/60  Pulse: 97  Temp: 98.4 F (36.9 C)  TempSrc: Oral  SpO2: 98%  Weight: 226 lb 6.4 oz (102.7 kg)  Height: 5' 8.8" (1.748 m)   Body mass index is 33.63 kg/m.  Advanced Directives 04/16/2021 04/09/2020 02/07/2020 07/17/2019 01/30/2019  Does Patient Have a Medical Advance Directive? No No No No No  Would patient like information on creating a medical advance directive? Yes (MAU/Ambulatory/Procedural Areas - Information given) - Yes (MAU/Ambulatory/Procedural Areas - Information given) No - Patient declined No - Patient declined    Current Medications (verified) Outpatient Encounter Medications as of 04/16/2021  Medication Sig   amLODipine (NORVASC) 10 MG tablet Take by mouth.   aspirin EC 81 MG tablet Take 81 mg by mouth daily.   atorvastatin (LIPITOR) 40 MG tablet Take 40 mg by mouth daily. 1/2 tablet daily   Cyanocobalamin (VITAMIN B-12) 2500 MCG SUBL Place 1 tablet under the tongue daily. Taking Monday through Friday   diphenhydrAMINE (BENADRYL) 25 mg capsule Take by mouth.   hydrochlorothiazide (HYDRODIURIL) 25 MG tablet Take 25 mg by mouth daily.    insulin glargine (LANTUS) 100 UNIT/ML injection Inject into the skin.   lisinopril (PRINIVIL,ZESTRIL) 40 MG tablet Take 40 mg by mouth daily.    metFORMIN (GLUCOPHAGE) 500 MG tablet Take  by mouth.   neomycin-polymyxin-hydrocortisone (CORTISPORIN) OTIC solution Apply 1-2 drops to toenails at bedtime once daily   pregabalin (LYRICA) 100 MG capsule Take 1 capsule by mouth twice daily   sitaGLIPtin-metformin (JANUMET) 50-1000 MG tablet Take 1 tablet by mouth 2 (two) times daily with a meal.   Vitamin D, Cholecalciferol, 25 MCG (1000 UT) CAPS Take by mouth. 1000 units Monday-Friday, 2000 units Saturday and Sunday   amLODipine (NORVASC) 10 MG tablet Take by mouth.   atorvastatin (LIPITOR) 40 MG tablet Take by mouth.   cyanocobalamin 100 MCG tablet Take by mouth.   glimepiride (AMARYL) 4 MG tablet Take 1 tablet by mouth 2 (two) times daily. (Patient not taking: Reported on 04/16/2021)   rosuvastatin (CRESTOR) 20 MG tablet Take 0.5 tablets by mouth daily. (Patient not taking: Reported on 04/16/2021)   terbinafine (LAMISIL) 250 MG tablet Take 1 tablet (250 mg total) by mouth daily. (Patient not taking: Reported on 04/16/2021)   No facility-administered encounter medications on file as of 04/16/2021.    Allergies (verified) Pollen extract   History: Past Medical History:  Diagnosis Date   Allergy    DM (diabetes mellitus) (HCC)    HTN (hypertension)    Hyperlipidemia    Past Surgical History:  Procedure Laterality Date   CATARACT EXTRACTION Right 2019   TONSILLECTOMY     age 25   Family History  Problem Relation Age of Onset   Diabetes Mother    Hypertension Mother    Stroke Mother    Hypertension Father    Diabetes Father    Stroke Paternal Grandfather    Social  History   Socioeconomic History   Marital status: Widowed    Spouse name: Not on file   Number of children: Not on file   Years of education: Not on file   Highest education level: Not on file  Occupational History   Occupation: retired  Tobacco Use   Smoking status: Never   Smokeless tobacco: Never  Vaping Use   Vaping Use: Never used  Substance and Sexual Activity   Alcohol use: Not Currently     Comment: occasionally   Drug use: Never   Sexual activity: Not Currently  Other Topics Concern   Not on file  Social History Narrative   Not on file   Social Determinants of Health   Financial Resource Strain: Low Risk    Difficulty of Paying Living Expenses: Not hard at all  Food Insecurity: No Food Insecurity   Worried About Programme researcher, broadcasting/film/video in the Last Year: Never true   Ran Out of Food in the Last Year: Never true  Transportation Needs: No Transportation Needs   Lack of Transportation (Medical): No   Lack of Transportation (Non-Medical): No  Physical Activity: Inactive   Days of Exercise per Week: 0 days   Minutes of Exercise per Session: 0 min  Stress: No Stress Concern Present   Feeling of Stress : Not at all  Social Connections: Not on file    Tobacco Counseling Counseling given: Not Answered   Clinical Intake:  Pre-visit preparation completed: Yes  Pain : No/denies pain     Nutritional Status: BMI > 30  Obese Nutritional Risks: None Diabetes: Yes  How often do you need to have someone help you when you read instructions, pamphlets, or other written materials from your doctor or pharmacy?: 1 - Never What is the last grade level you completed in school?: college  Diabetic? Yes Nutrition Risk Assessment:  Has the patient had any N/V/D within the last 2 months?  No  Does the patient have any non-healing wounds?  No  Has the patient had any unintentional weight loss or weight gain?  No   Diabetes:  Is the patient diabetic?  Yes  If diabetic, was a CBG obtained today?  No  Did the patient bring in their glucometer from home?  No  How often do you monitor your CBG's? daily.   Financial Strains and Diabetes Management:  Are you having any financial strains with the device, your supplies or your medication? No .  Does the patient want to be seen by Chronic Care Management for management of their diabetes?  No  Would the patient like to be referred to a  Nutritionist or for Diabetic Management?  No   Diabetic Exams:  Diabetic Eye Exam: Completed 06/25/2020 Diabetic Foot Exam: Completed 09/02/2020   Interpreter Needed?: No  Information entered by :: NAllen LPN   Activities of Daily Living In your present state of health, do you have any difficulty performing the following activities: 04/16/2021  Hearing? N  Vision? N  Difficulty concentrating or making decisions? N  Walking or climbing stairs? N  Dressing or bathing? N  Doing errands, shopping? N  Preparing Food and eating ? N  Using the Toilet? N  In the past six months, have you accidently leaked urine? N  Do you have problems with loss of bowel control? N  Managing your Medications? N  Managing your Finances? N  Housekeeping or managing your Housekeeping? N  Some recent data might be hidden  Patient Care Team: [name], MD as PCP - General (Internal Medicine) Harlan Stains, [facility] (Pharmacist)  Indicate any recent Medical Services you may have received from other than [facility] providers in the past year (date may be approximate).     Assessment:   This is a routine wellness examination for Tahjai.  Hearing/Vision screen No results found.  Dietary issues and exercise activities discussed: Current Exercise Habits: The patient does not participate in regular exercise at present   Goals Addressed             This Visit's Progress    Patient Stated       04/16/2021, wants to eat healthy       Depression Screen PHQ 2/9 Scores 04/16/2021 04/09/2020 05/30/2019 05/22/2019 01/30/2019 01/30/2019 11/30/2018  PHQ - 2 Score 0 0 0 0 0 0 0  PHQ- 9 Score - 2 - - 0 - -    Fall Risk Fall Risk  04/16/2021 04/09/2020 06/04/2019 05/30/2019 05/22/2019  Falls in the past year? 0 0 0 0 0  Risk for fall due to : Medication side effect Medication side effect - - -  Follow up Falls evaluation completed;Education provided;Falls prevention discussed - - - -    FALL RISK PREVENTION  PERTAINING TO THE HOME:  Any stairs in or around the home? No  If so, are there any without handrails?  N/a Home free of loose throw rugs in walkways, pet beds, electrical cords, etc? Yes  Adequate lighting in your home to reduce risk of falls? Yes   ASSISTIVE DEVICES UTILIZED TO PREVENT FALLS:  Life alert? No  Use of a cane, walker or w/c? No  Grab bars in the bathroom? Yes  Shower chair or bench in shower? No  Elevated toilet seat or a handicapped toilet? No   TIMED UP AND GO:  Was the test performed? No .    Gait steady and fast without use of assistive device  Cognitive Function:     6CIT Screen 04/16/2021 04/09/2020 01/30/2019  What Year? 0 points 0 points 0 points  What month? 0 points 0 points 0 points  What time? 0 points 0 points 0 points  Count back from 20 0 points 0 points 0 points  Months in reverse 0 points 0 points 0 points  Repeat phrase 0 points 2 points 0 points  Total Score 0 2 0    Immunizations Immunization History  Administered Date(s) Administered   Janssen (J&J) SARS-COV-2 Vaccination 12/17/2019   Moderna Sars-Covid-2 Vaccination 07/28/2020, 01/30/2021   Pneumococcal Conjugate-13 12/12/2015   Pneumococcal Polysaccharide-23 10/11/2014   Tdap 10/11/2014   Zoster Recombinat (Shingrix) 01/30/2021    TDAP status: Up to date  Flu Vaccine status: Declined, Education has been provided regarding the importance of this vaccine but patient still declined. Advised may receive this vaccine at local pharmacy or Health Dept. Aware to provide a copy of the vaccination record if obtained from local pharmacy or Health Dept. Verbalized acceptance and understanding.  Pneumococcal vaccine status: Up to date  Covid-19 vaccine status: Completed vaccines  Qualifies for Shingles Vaccine? Yes   Zostavax completed No   Shingrix Completed?: needs second dose  Screening Tests Health Maintenance  Topic Date Due   PNA vac Low Risk Adult (2 of 2 - PPSV23) 10/12/2019    FOOT EXAM  09/02/2020   Zoster Vaccines- Shingrix (2 of 2) 03/27/2021   INFLUENZA VACCINE  04/13/2021   HEMOGLOBIN A1C  05/20/2021   COVID-19 Vaccine (4 -  Booster for [employer] series) 06/02/2021   OPHTHALMOLOGY EXAM  06/25/2021   Fecal DNA (Cologuard)  03/08/2022   TETANUS/TDAP  02/18/2026   Hepatitis C Screening  Completed   HPV VACCINES  Aged Out    Health Maintenance  Health Maintenance Due  Topic Date Due   PNA vac Low Risk Adult (2 of 2 - PPSV23) 10/12/2019   FOOT EXAM  09/02/2020   Zoster Vaccines- Shingrix (2 of 2) 03/27/2021   INFLUENZA VACCINE  04/13/2021    Colorectal cancer screening: Type of screening: Cologuard. Completed 03/09/2019. Repeat every 3 years  Lung Cancer Screening: (Low Dose CT Chest recommended if Age 40-80 years, 30 pack-year currently smoking OR have quit w/in 15years.) does not qualify.   Lung Cancer Screening Referral: no  Additional Screening:  Hepatitis C Screening: does qualify; Completed 11/17/2015  Vision Screening: Recommended annual ophthalmology exams for early detection of glaucoma and other disorders of the eye. Is the patient up to date with their annual eye exam?  Yes  Who is the provider or what is the name of the office in which the patient attends annual eye exams? Dr. Dione [place], [place] If pt is not established with a provider, would they like to be referred to a provider to establish care? No .   Dental Screening: Recommended annual dental exams for proper oral hygiene  Community Resource Referral / Chronic Care Management: CRR required this visit?  No   CCM required this visit?  No      Plan:     I have personally reviewed and noted the following in the patient's chart:   Medical and social history Use of alcohol, tobacco or illicit drugs  Current medications and supplements including opioid prescriptions. Patient is not currently taking opioid prescriptions. Functional ability and status Nutritional status Physical  activity Advanced directives List of other physicians Hospitalizations, surgeries, and ER visits in previous 12 months Vitals Screenings to include cognitive, depression, and falls Referrals and appointments  In addition, I have reviewed and discussed with patient certain preventive protocols, quality metrics, and best practice recommendations. A written personalized care plan for preventive services as well as general preventive health recommendations were provided to patient.     Barb Merinoickeah E Zhaniya Swallows, LPN   1/6/10968/12/2020   Nurse Notes:

## 2021-04-16 NOTE — Patient Instructions (Signed)
Marcus Davis , Thank you for taking time to come for your Medicare Wellness Visit. I appreciate your ongoing commitment to your health goals. Please review the following plan we discussed and let me know if I can assist you in the future.   Screening recommendations/referrals: Colonoscopy: cologuard 03/09/2019, due 03/08/2022 Recommended yearly ophthalmology/optometry visit for glaucoma screening and checkup Recommended yearly dental visit for hygiene and checkup  Vaccinations: Influenza vaccine: decline Pneumococcal vaccine: completed 12/12/2015 Tdap vaccine: completed 02/19/2016, due 02/18/2026 Shingles vaccine: needs second dose   Covid-19:  01/30/2021, 07/18/2020, 12/17/2019  Advanced directives: Advance directive discussed with you today. I have provided a copy for you to complete at home and have notarized. Once this is complete please bring a copy in to our office so we can scan it into your chart.  Conditions/risks identified: none  Next appointment: Follow up in one year for your annual wellness visit.   Preventive Care 46 Years and Older, Male Preventive care refers to lifestyle choices and visits with your health care provider that can promote health and wellness. What does preventive care include? A yearly physical exam. This is also called an annual well check. Dental exams once or twice a year. Routine eye exams. Ask your health care provider how often you should have your eyes checked. Personal lifestyle choices, including: Daily care of your teeth and gums. Regular physical activity. Eating a healthy diet. Avoiding tobacco and drug use. Limiting alcohol use. Practicing safe sex. Taking low doses of aspirin every day. Taking vitamin and mineral supplements as recommended by your health care provider. What happens during an annual well check? The services and screenings done by your health care provider during your annual well check will depend on your age, overall health,  lifestyle risk factors, and family history of disease. Counseling  Your health care provider may ask you questions about your: Alcohol use. Tobacco use. Drug use. Emotional well-being. Home and relationship well-being. Sexual activity. Eating habits. History of falls. Memory and ability to understand (cognition). Work and work Astronomer. Screening  You may have the following tests or measurements: Height, weight, and BMI. Blood pressure. Lipid and cholesterol levels. These may be checked every 5 years, or more frequently if you are over 44 years old. Skin check. Lung cancer screening. You may have this screening every year starting at age 56 if you have a 30-pack-year history of smoking and currently smoke or have quit within the past 15 years. Fecal occult blood test (FOBT) of the stool. You may have this test every year starting at age 77. Flexible sigmoidoscopy or colonoscopy. You may have a sigmoidoscopy every 5 years or a colonoscopy every 10 years starting at age 48. Prostate cancer screening. Recommendations will vary depending on your family history and other risks. Hepatitis C blood test. Hepatitis B blood test. Sexually transmitted disease (STD) testing. Diabetes screening. This is done by checking your blood sugar (glucose) after you have not eaten for a while (fasting). You may have this done every 1-3 years. Abdominal aortic aneurysm (AAA) screening. You may need this if you are a current or former smoker. Osteoporosis. You may be screened starting at age 21 if you are at high risk. Talk with your health care provider about your test results, treatment options, and if necessary, the need for more tests. Vaccines  Your health care provider may recommend certain vaccines, such as: Influenza vaccine. This is recommended every year. Tetanus, diphtheria, and acellular pertussis (Tdap, Td) vaccine. You may  need a Td booster every 10 years. Zoster vaccine. You may need this  after age 77. Pneumococcal 13-valent conjugate (PCV13) vaccine. One dose is recommended after age 51. Pneumococcal polysaccharide (PPSV23) vaccine. One dose is recommended after age 74. Talk to your health care provider about which screenings and vaccines you need and how often you need them. This information is not intended to replace advice given to you by your health care provider. Make sure you discuss any questions you have with your health care provider. Document Released: 09/26/2015 Document Revised: 05/19/2016 Document Reviewed: 07/01/2015 Elsevier Interactive Patient Education  2017 [employer] Prevention in the Home Falls can cause injuries. They can happen to people of all ages. There are many things you can do to make your home safe and to help prevent falls. What can I do on the outside of my home? Regularly fix the edges of walkways and driveways and fix any cracks. Remove anything that might make you trip as you walk through a door, such as a raised step or threshold. Trim any bushes or trees on the path to your home. Use bright outdoor lighting. Clear any walking paths of anything that might make someone trip, such as rocks or tools. Regularly check to see if handrails are loose or broken. Make sure that both sides of any steps have handrails. Any raised decks and porches should have guardrails on the edges. Have any leaves, snow, or ice cleared regularly. Use sand or salt on walking paths during winter. Clean up any spills in your garage right away. This includes oil or grease spills. What can I do in the bathroom? Use night lights. Install grab bars by the toilet and in the tub and shower. Do not use towel bars as grab bars. Use non-skid mats or decals in the tub or shower. If you need to sit down in the shower, use a plastic, non-slip stool. Keep the floor dry. Clean up any water that spills on the floor as soon as it happens. Remove soap buildup in the tub or  shower regularly. Attach bath mats securely with double-sided non-slip rug tape. Do not have throw rugs and other things on the floor that can make you trip. What can I do in the bedroom? Use night lights. Make sure that you have a light by your bed that is easy to reach. Do not use any sheets or blankets that are too big for your bed. They should not hang down onto the floor. Have a firm chair that has side arms. You can use this for support while you get dressed. Do not have throw rugs and other things on the floor that can make you trip. What can I do in the kitchen? Clean up any spills right away. Avoid walking on wet floors. Keep items that you use a lot in easy-to-reach places. If you need to reach something above you, use a strong step stool that has a grab bar. Keep electrical cords out of the way. Do not use floor polish or wax that makes floors slippery. If you must use wax, use non-skid floor wax. Do not have throw rugs and other things on the floor that can make you trip. What can I do with my stairs? Do not leave any items on the stairs. Make sure that there are handrails on both sides of the stairs and use them. Fix handrails that are broken or loose. Make sure that handrails are as long as the stairways.  Check any carpeting to make sure that it is firmly attached to the stairs. Fix any carpet that is loose or worn. Avoid having throw rugs at the top or bottom of the stairs. If you do have throw rugs, attach them to the floor with carpet tape. Make sure that you have a light switch at the top of the stairs and the bottom of the stairs. If you do not have them, ask someone to add them for you. What else can I do to help prevent falls? Wear shoes that: Do not have high heels. Have rubber bottoms. Are comfortable and fit you well. Are closed at the toe. Do not wear sandals. If you use a stepladder: Make sure that it is fully opened. Do not climb a closed stepladder. Make  sure that both sides of the stepladder are locked into place. Ask someone to hold it for you, if possible. Clearly mark and make sure that you can see: Any grab bars or handrails. First and last steps. Where the edge of each step is. Use tools that help you move around (mobility aids) if they are needed. These include: Canes. Walkers. Scooters. Crutches. Turn on the lights when you go into a dark area. Replace any light bulbs as soon as they burn out. Set up your furniture so you have a clear path. Avoid moving your furniture around. If any of your floors are uneven, fix them. If there are any pets around you, be aware of where they are. Review your medicines with your doctor. Some medicines can make you feel dizzy. This can increase your chance of falling. Ask your doctor what other things that you can do to help prevent falls. This information is not intended to replace advice given to you by your health care provider. Make sure you discuss any questions you have with your health care provider. Document Released: 06/26/2009 Document Revised: 02/05/2016 Document Reviewed: 10/04/2014 Elsevier Interactive Patient Education  2017 [employer].

## 2021-04-17 LAB — CBC
Hematocrit: 35.9 % — ABNORMAL LOW (ref 37.5–51.0)
Hemoglobin: 12.3 g/dL — ABNORMAL LOW (ref 13.0–17.7)
MCH: 29.8 pg (ref 26.6–33.0)
MCHC: 34.3 g/dL (ref 31.5–35.7)
MCV: 87 fL (ref 79–97)
Platelets: 295 10*3/uL (ref 150–450)
RBC: 4.13 x10E6/uL — ABNORMAL LOW (ref 4.14–5.80)
RDW: 12.8 % (ref 11.6–15.4)
WBC: 5 10*3/uL (ref 3.4–10.8)

## 2021-04-17 LAB — BMP8+EGFR
BUN/Creatinine Ratio: 8 — ABNORMAL LOW (ref 10–24)
BUN: 9 mg/dL (ref 8–27)
CO2: 24 mmol/L (ref 20–29)
Calcium: 9.6 mg/dL (ref 8.6–10.2)
Chloride: 100 mmol/L (ref 96–106)
Creatinine, Ser: 1.19 mg/dL (ref 0.76–1.27)
Glucose: 141 mg/dL — ABNORMAL HIGH (ref 65–99)
Potassium: 4.6 mmol/L (ref 3.5–5.2)
Sodium: 140 mmol/L (ref 134–144)
eGFR: 67 mL/min/{1.73_m2} (ref >59)

## 2021-04-17 LAB — HEMOGLOBIN A1C
Est. average glucose Bld gHb Est-mCnc: 189 mg/dL
Hgb A1c MFr Bld: 8.2 % — ABNORMAL HIGH (ref 4.8–5.6)

## 2021-04-17 IMAGING — US US EXTREM LOW VENOUS*R*
1 series · 14 of 24 positions shown · non-contrast
Comparison: None

CLINICAL DATA: Swelling

EXAM:
RIGHT LOWER EXTREMITY VENOUS DOPPLER ULTRASOUND
TECHNIQUE: Gray-scale sonography with compression, as well as color and duplex
ultrasound, were performed to evaluate the deep venous system from
the level of the common femoral vein through the popliteal and
proximal calf veins.

[Series 1: us extrem low venous*right* · 0.08mm/px · 14 of 36 slices shown]
[im 1/36]
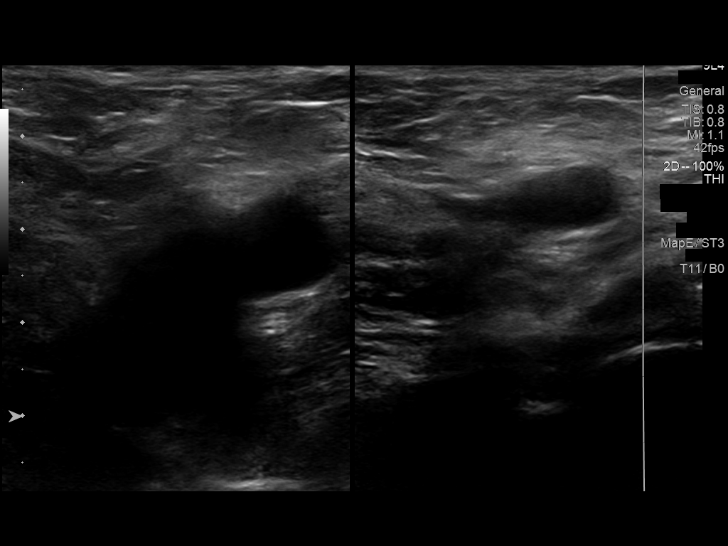
[im 4/36]
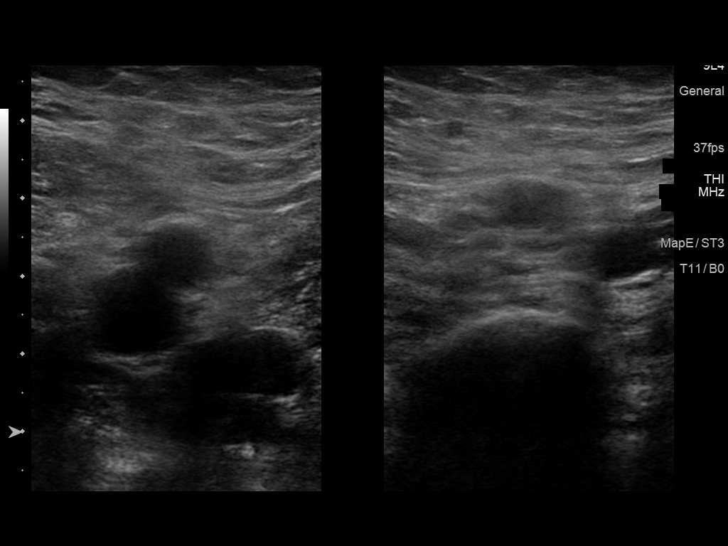
[im 7/36]
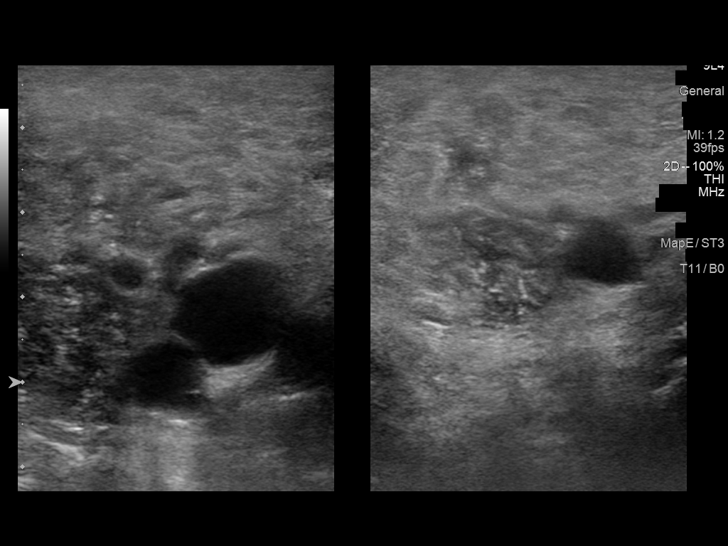
[im 10/36]
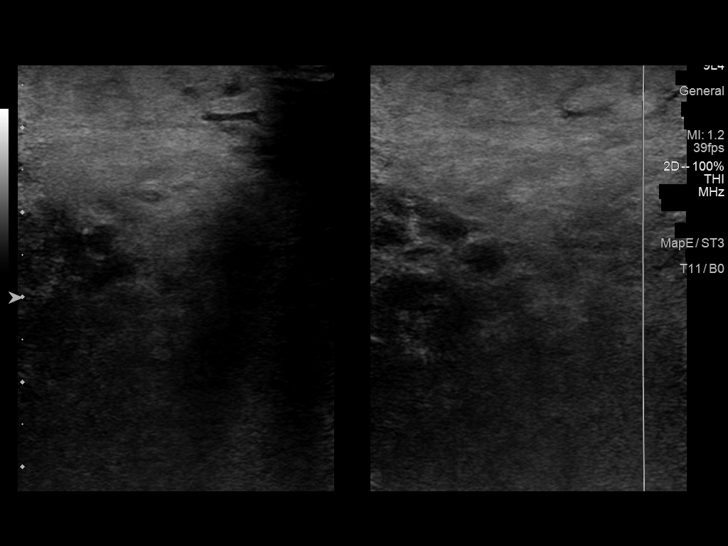
[im 11/36]
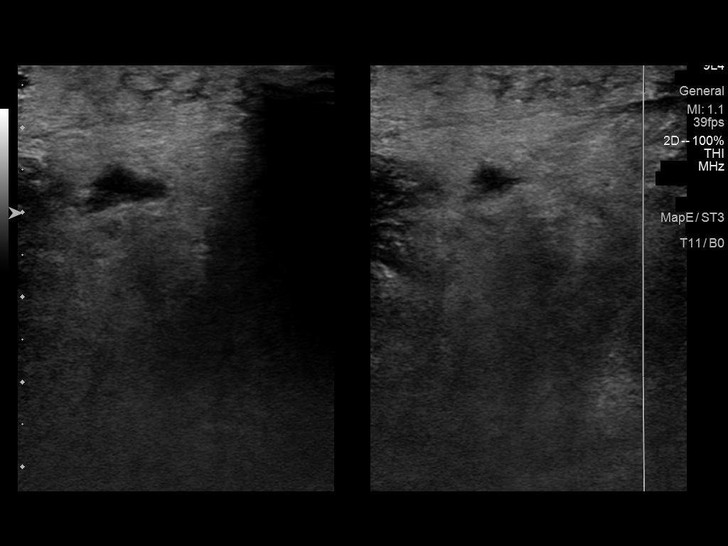
[im 14/36]
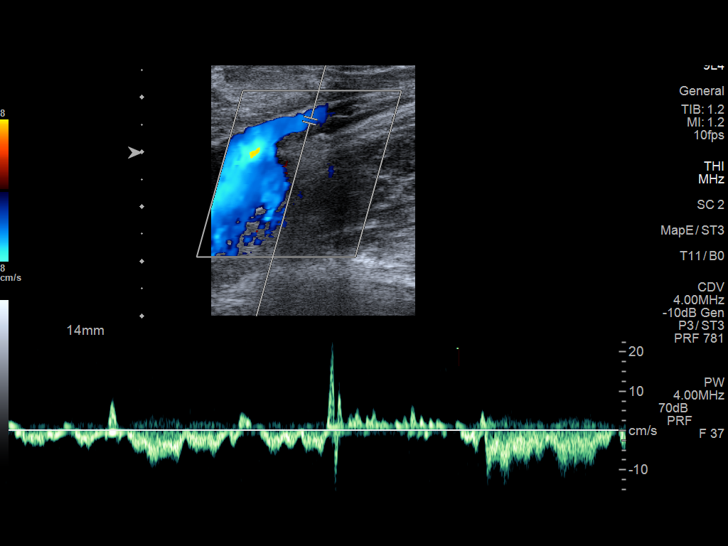
[im 17/36]
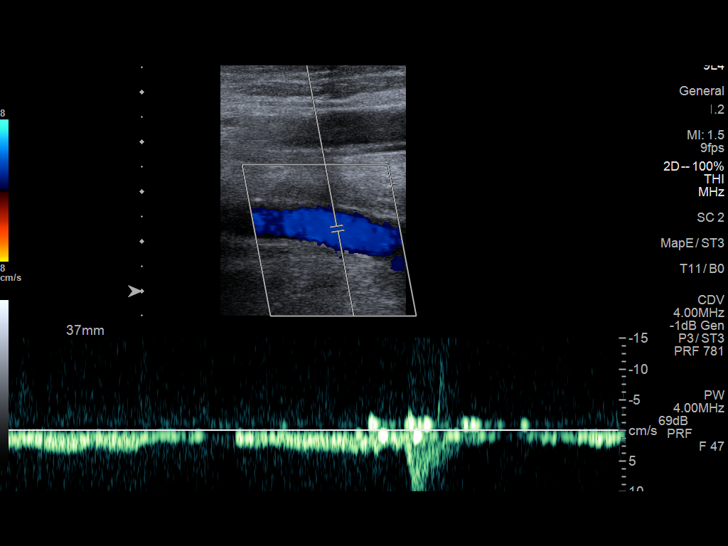
[im 19/36]
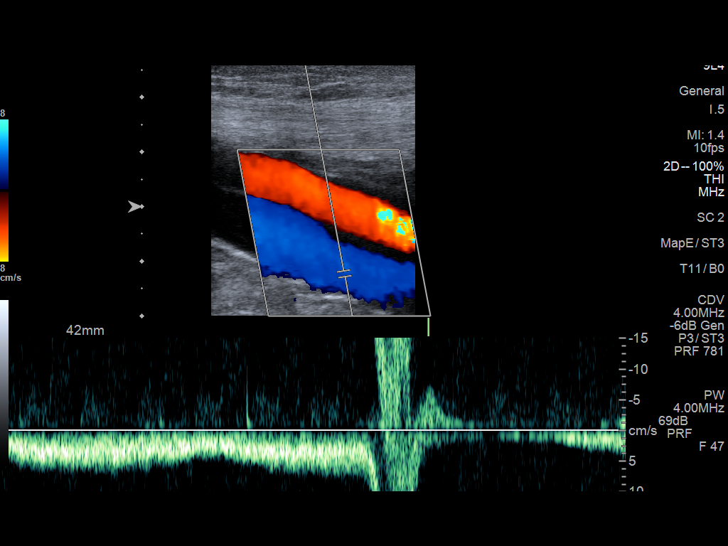
[im 22/36]
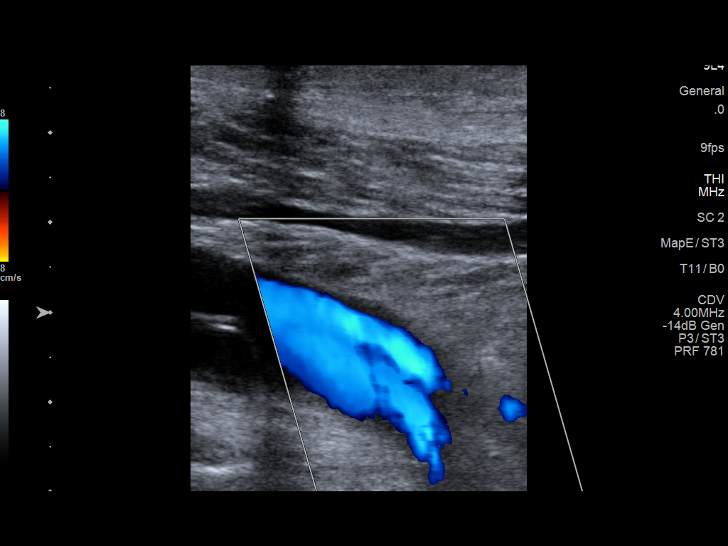
[im 25/36]
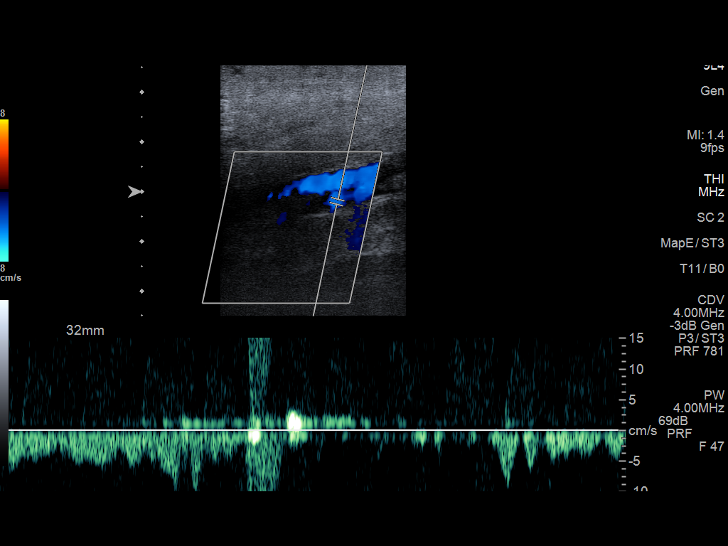
[im 28/36]
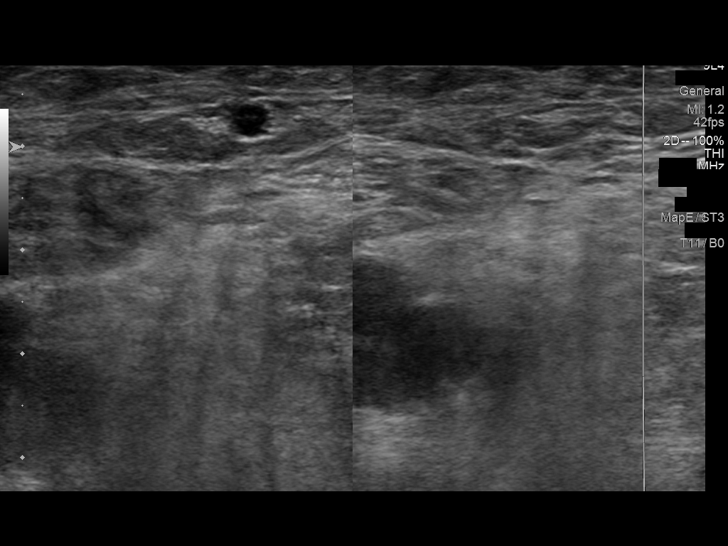
[im 29/36]
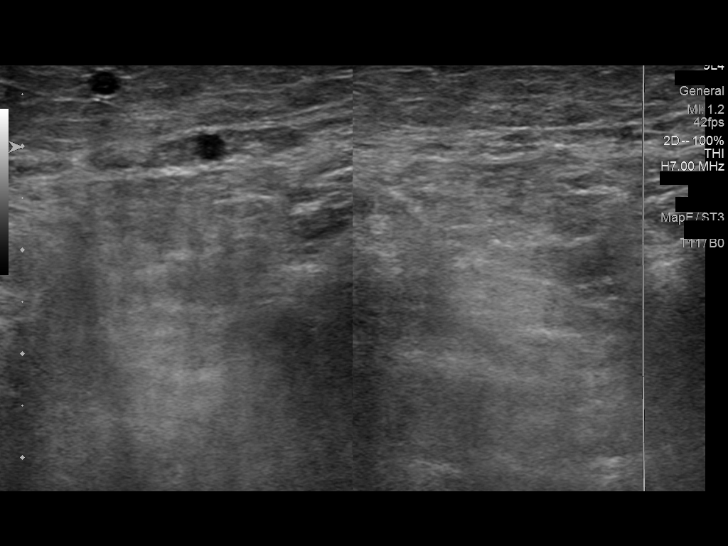
[im 32/36]
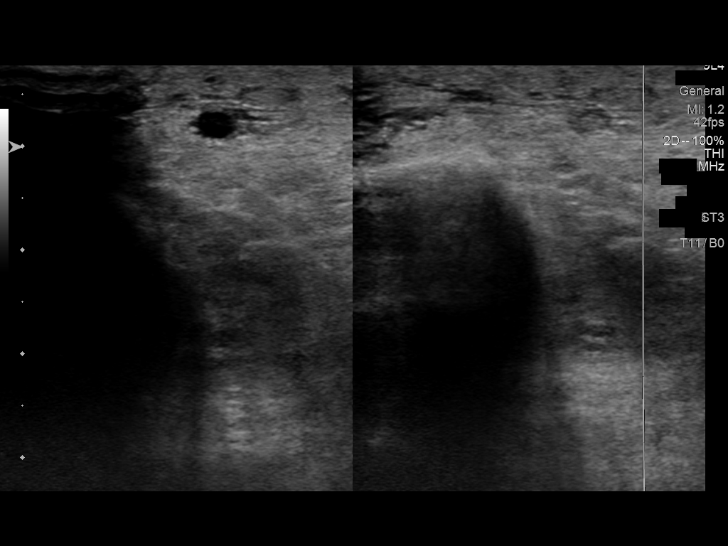
[im 36/36]
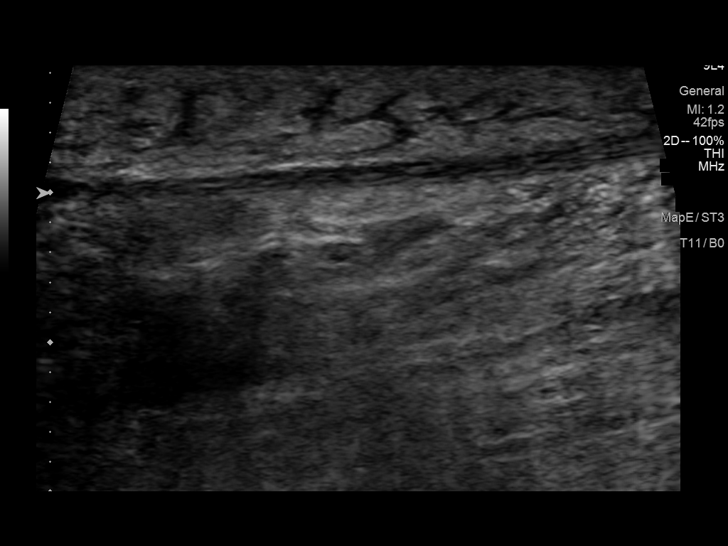

[14 of 24 positions shown; findings below may reference images not displayed]

FINDINGS: Normal compressibility of the common femoral, superficial femoral,
and popliteal veins, as well as the proximal calf veins. No filling
defects to suggest DVT on grayscale or color Doppler imaging.
Doppler waveforms show normal direction of venous flow, normal
respiratory phasicity and response to augmentation.

Mild subcutaneous edema in the calf. Visualized segments of the
saphenous venous system normal in caliber and compressibility.

Survey views of the contralateral common femoral vein are
unremarkable.
IMPRESSION: No femoropopliteal and no calf DVT in the visualized calf veins. If
clinical symptoms are inconsistent or if there are persistent or
worsening symptoms, further imaging (possibly involving the iliac
veins) may be warranted.

## 2021-04-20 ENCOUNTER — Telehealth: Payer: Self-pay

## 2021-04-20 MED ORDER — SHINGRIX 50 MCG/0.5ML IM SUSR: 0.5000 mL | Freq: Once | INTRAMUSCULAR | 0 refills | Status: AC

## 2021-04-20 NOTE — Chronic Care Management (AMB) (Signed)
    Chronic Care Management Pharmacy Assistant   Name: Marcus Davis  MRN: [id number] DOB: [birth year]   Reason for Encounter: Disease State/ Diabetes  Recent office visits:  11-17-2020 [name], MD. Referral placed for Podiatry.  04-16-2021 Barb Merino, LPN. Annual wellness.  04-16-2021 [name], MD. Glucose= 141, BUN/Creatinine= 8. A1C= 8.2. RBC= 4.13, Hemo= 12.3, Hema= 35.9  Recent consult visits:  11-27-2020 [name], DPM ([facility]). Fungal culture.  01-08-2021 11-27-2020 [name], DPM ([facility]). STARTED Cortisporin solution apply 1-2 drops to toenails once nightly.  01-30-2021 [facility] [place] [place] [facility] Received shingles and Covid vaccines.  02-19-2021 [name], DPM. Follow up in 6-8 weeks.  Hospital visits:  None in previous 6 months  Medications: Outpatient Encounter Medications as of 04/20/2021  Medication Sig   amLODipine (NORVASC) 10 MG tablet Take by mouth.   amLODipine (NORVASC) 10 MG tablet Take by mouth.   aspirin EC 81 MG tablet Take 81 mg by mouth daily.   atorvastatin (LIPITOR) 40 MG tablet Take 40 mg by mouth daily. 1/2 tablet daily   atorvastatin (LIPITOR) 40 MG tablet Take by mouth.   Cyanocobalamin (VITAMIN B-12) 2500 MCG SUBL Place 1 tablet under the tongue daily. Taking Monday through Friday   cyanocobalamin 100 MCG tablet Take by mouth.   diphenhydrAMINE (BENADRYL) 25 mg capsule Take by mouth.   glimepiride (AMARYL) 4 MG tablet Take 1 tablet by mouth 2 (two) times daily. (Patient not taking: Reported on 04/16/2021)   hydrochlorothiazide (HYDRODIURIL) 25 MG tablet Take 25 mg by mouth daily.    insulin glargine (LANTUS) 100 UNIT/ML injection Inject into the skin.   lisinopril (PRINIVIL,ZESTRIL) 40 MG tablet Take 40 mg by mouth daily.    metFORMIN (GLUCOPHAGE) 500 MG tablet Take by mouth.   neomycin-polymyxin-hydrocortisone (CORTISPORIN) OTIC solution Apply 1-2 drops to toenails at bedtime once daily    pregabalin (LYRICA) 100 MG capsule Take 1 capsule by mouth twice daily   rosuvastatin (CRESTOR) 20 MG tablet Take 0.5 tablets by mouth daily. (Patient not taking: Reported on 04/16/2021)   sitaGLIPtin-metformin (JANUMET) 50-1000 MG tablet Take 1 tablet by mouth 2 (two) times daily with a meal.   terbinafine (LAMISIL) 250 MG tablet Take 1 tablet (250 mg total) by mouth daily. (Patient not taking: Reported on 04/16/2021)   Vitamin D, Cholecalciferol, 25 MCG (1000 UT) CAPS Take by mouth. 1000 units Monday-Friday, 2000 units Saturday and Sunday   No facility-administered encounter medications on file as of 04/20/2021.   Recent Relevant Labs: Lab Results  Component Value Date/Time   HGBA1C 8.2 (H) 04/16/2021 04:23 PM   HGBA1C 7.7 (H) 11/17/2020 02:21 PM   MICROALBUR 30 04/16/2021 04:06 PM   MICROALBUR 80 04/09/2020 03:42 PM    Kidney Function Lab Results  Component Value Date/Time   CREATININE 1.19 04/16/2021 04:23 PM   CREATININE 1.20 11/17/2020 02:21 PM   GFRNONAA 65 08/13/2020 04:51 PM   GFRAA 76 08/13/2020 04:51 PM     08 -04-2021: 1st attempt left VM 04-22-2021: 2nd attempt Left VM 04-28-2021: 3rd attempt left VM  Care Gaps: Yearly foot exam overdue Medicare wellness 05-03-2022 RAF= 0.7%  Star Rating Drugs: Atorvastatin 40 mg- Last filled no fill history Lisinopril 40 mg- Last filled no fill history Janumet 50-1000 mg- samples Patient assistance in process  05-05-2022 [facility] Clinical Pharmacist Assistant [phone]

## 2021-04-23 ENCOUNTER — Other Ambulatory Visit: Payer: Self-pay

## 2021-04-23 ENCOUNTER — Ambulatory Visit: Payer: Medicare HMO | Admitting: Sports Medicine

## 2021-04-23 DIAGNOSIS — E1141 Type 2 diabetes mellitus with diabetic mononeuropathy: Secondary | ICD-10-CM

## 2021-04-23 DIAGNOSIS — L603 Nail dystrophy: Secondary | ICD-10-CM

## 2021-04-23 DIAGNOSIS — Z79899 Other long term (current) drug therapy: Secondary | ICD-10-CM

## 2021-04-23 NOTE — Progress Notes (Signed)
Subjective: Marcus Davis is a 70 y.o. male patient seen today in office for follow up evaluation of nail fungus on Lamisil. Patient states that he is doing well with no adverse reaction. Patient has no other pedal complaints at this time.   Fasting blood sugar not recorded  Patient Active Problem List   Diagnosis Date Noted   Dystrophia unguium 02/19/2021   Hyperlipidemia 02/19/2021   Lymphedema, not elsewhere classified 02/19/2021   Metabolic syndrome 02/19/2021   Pseudophakia 02/19/2021   Sleep apnea 02/19/2021   Type 2 diabetes mellitus with diabetic mononeuropathy, without long-term current use of insulin (HCC) 02/08/2019   Nocturia 01/30/2019   Uncontrolled type 2 diabetes mellitus with hyperglycemia (HCC) 08/28/2018   Essential hypertension 08/28/2018   Class 1 obesity due to excess calories with serious comorbidity and body mass index (BMI) of 31.0 to 31.9 in adult 08/28/2018   Hypertensive retinopathy of both eyes 02/13/2016   Macular puckering of retina, right eye 02/13/2016   Nuclear sclerotic cataract of both eyes 02/13/2016   History of colonoscopy 09/13/2012    Current Outpatient Medications on File Prior to Visit  Medication Sig Dispense Refill   Alcohol Swabs (ALCOHOL WIPES) 70 % PADS USE PAD TO AFFECTED AREA DAILY     amLODipine (NORVASC) 10 MG tablet Take by mouth.     amLODipine (NORVASC) 10 MG tablet Take by mouth.     amLODipine (NORVASC) 10 MG tablet TAKE ONE TABLET BY MOUTH DAILY FOR HEART     aspirin EC 81 MG tablet Take 81 mg by mouth daily.     atorvastatin (LIPITOR) 40 MG tablet Take 40 mg by mouth daily. 1/2 tablet daily     atorvastatin (LIPITOR) 40 MG tablet Take by mouth.     atorvastatin (LIPITOR) 40 MG tablet TAKE ONE-HALF TABLET BY MOUTH AT BEDTIME FOR CHOLESTEROL *REPLACES ROSUVASTATIN (CRESTOR)*     Cyanocobalamin (VITAMIN B-12) 2500 MCG SUBL Place 1 tablet under the tongue daily. Taking Monday through Friday     cyanocobalamin 100 MCG tablet  Take by mouth.     diphenhydrAMINE (BENADRYL) 25 mg capsule Take by mouth.     diphenhydrAMINE (BENADRYL) 25 mg capsule TAKE ONE CAPSULE BY MOUTH AT BEDTIME FOR NIGHTTIME CONGESTION * MAY CAUSE DROWSINESS *     glimepiride (AMARYL) 4 MG tablet Take 1 tablet by mouth 2 (two) times daily.     hydrochlorothiazide (HYDRODIURIL) 25 MG tablet Take 25 mg by mouth daily.      insulin glargine (LANTUS) 100 UNIT/ML injection Inject into the skin.     insulin glargine-yfgn (SEMGLEE) 100 UNIT/ML injection INJECT 10 UNITS SUBCUTANEOUSLY AT BEDTIME FOR DIABETES (USE WITHIN 28 DAYS AFTER OPENING VIAL)  START WITH 8 UNITS AT NIGHT AND AFTER 2 DAYS INCREASE TO 10 UNITS EVERY  DAY; INCREASE BY 2 UNITS EVERY 2ND DAY UNTIL FBG REMAINS AT 120-150 (CONVERTED FROM LANTUS) FOR DIABETES (USE WITHIN 28 DAYS AFTER OPENING VIAL)  START WITH 8 UNITS AT NIGHT AND AFTER 2 DAYS INCREASE TO 10 UNITS EVERY   DAY; INCREASE BY 2 UNITS EVERY 2ND DAY UNTIL FBG REMAINS AT 120-150 (CONVERTED FROM LANTUS)     lisinopril (PRINIVIL,ZESTRIL) 40 MG tablet Take 40 mg by mouth daily.      metFORMIN (GLUCOPHAGE) 500 MG tablet Take by mouth.     metFORMIN (GLUCOPHAGE) 500 MG tablet TAKE TWO TABLETS BY MOUTH BEFORE BREAKFAST AND TAKE ONE TABLET BEFORE SUPPER FOR DIABETES (ANNUAL KIDNEY FUNCTION TESTING IS NEEDED)  neomycin-polymyxin-hydrocortisone (CORTISPORIN) OTIC solution Apply 1-2 drops to toenails at bedtime once daily 10 mL 3   pregabalin (LYRICA) 100 MG capsule Take 1 capsule by mouth twice daily 60 capsule 5   rosuvastatin (CRESTOR) 20 MG tablet Take 0.5 tablets by mouth daily.     sitaGLIPtin-metformin (JANUMET) 50-1000 MG tablet Take 1 tablet by mouth 2 (two) times daily with a meal.     terbinafine (LAMISIL) 250 MG tablet Take 1 tablet (250 mg total) by mouth daily. 90 tablet 0   Vitamin D, Cholecalciferol, 25 MCG (1000 UT) CAPS Take by mouth. 1000 units Monday-Friday, 2000 units Saturday and Sunday     No current  facility-administered medications on file prior to visit.    Allergies  Allergen Reactions   Pollen Extract Other (See Comments)    Sneezing, runny nose, congestion,     Objective: Physical Exam  General: Well developed, nourished, no acute distress, awake, alert and oriented x 3  Vascular: Dorsalis pedis artery 2/4 bilateral, Posterior tibial artery 1 /4 bilateral, skin temperature warm to warm proximal to distal bilateral lower extremities, no varicosities, pedal hair present bilateral.  Neurological: Gross sensation present via light touch bilateral.   Dermatological: Skin is warm, dry, and supple bilateral, Nails 1-10 are tender, short thick, and discolored with mild subungal debris and early clearance noted at proximal nail bed, no webspace macerations present bilateral, no open lesions present bilateral, diffuse dry skin plantar surfaces.  No signs of infection bilateral.  Musculoskeletal: No symptomatic boney deformities noted bilateral. Muscular strength within normal limits without painon range of motion. No pain with calf compression bilateral.  Assessment and Plan:  Problem List Items Addressed This Visit       Endocrine   Type 2 diabetes mellitus with diabetic mononeuropathy, without long-term current use of insulin (HCC)   Relevant Medications   atorvastatin (LIPITOR) 40 MG tablet   insulin glargine-yfgn (SEMGLEE) 100 UNIT/ML injection   metFORMIN (GLUCOPHAGE) 500 MG tablet   Other Visit Diagnoses     Encounter for long-term current use of medication    -  Primary   Onychodystrophy          -Examined patient -Cont with Lamisil until completed; patient reports he has a few tablets left -Advised good hygiene habits and educated patient on proper foot care to prevent re-infection like before -Continue with neomycin to the nails as directed like before or may switch to using vicks or tea tree oil -Continue with foot miracle cream for dry skin -Patient to return  in 6 months for follow up evaluation or sooner if symptoms worsen.  [name], DPM

## 2021-06-15 ENCOUNTER — Other Ambulatory Visit: Payer: Self-pay

## 2021-06-15 MED ORDER — SITAGLIPTIN PHOS-METFORMIN HCL 50-1000 MG PO TABS: 1.0000 | ORAL_TABLET | Freq: Two times a day (BID) | ORAL | 1 refills | Status: DC

## 2021-06-17 ENCOUNTER — Telehealth: Payer: Self-pay

## 2021-06-17 NOTE — Chronic Care Management (AMB) (Signed)
Chronic Care Management Pharmacy Assistant   Name: Marcus Davis  MRN: [id number] DOB: December 16, 1950   Reason for Encounter: Disease State/ Hypertension  Recent office visits:  04-16-2021 Barb Merino, LPN. Annual wellness visit  04-16-2021 [name], MD. Glucose= 141, BUN/Creatinine= 8. A1C= 8.2. RBC= 4.13, Hemo= 12.3, Hema= 35.9  Recent consult visits:  01-08-2021 [name], DPM (Podiatry). STARTED Cortisporin solution apply 1-2 drops to toenails once nightly.  01-30-2021 [facility]ise Killian, [place] ([facility] Received shingles and Covid vaccines.  02-19-2021 [name], DPM (Podiatry). Follow up in 6-8 weeks.  04-23-2021 [name], DPM (Podiatry). Follow up in 6 months  05-01-2021 Kathlene November, [place] ([facility] Unable to view encounter.  05-26-2021 Sinda Du, MD ([facility] Unable to view encounter  Hospital visits:  None in previous 6 months  Medications: Outpatient Encounter Medications as of 06/17/2021  Medication Sig   Alcohol Swabs (ALCOHOL WIPES) 70 % PADS USE PAD TO AFFECTED AREA DAILY   amLODipine (NORVASC) 10 MG tablet Take by mouth.   amLODipine (NORVASC) 10 MG tablet Take by mouth.   amLODipine (NORVASC) 10 MG tablet TAKE ONE TABLET BY MOUTH DAILY FOR HEART   aspirin EC 81 MG tablet Take 81 mg by mouth daily.   atorvastatin (LIPITOR) 40 MG tablet Take 40 mg by mouth daily. 1/2 tablet daily   atorvastatin (LIPITOR) 40 MG tablet Take by mouth.   atorvastatin (LIPITOR) 40 MG tablet TAKE ONE-HALF TABLET BY MOUTH AT BEDTIME FOR CHOLESTEROL *REPLACES ROSUVASTATIN (CRESTOR)*   Cyanocobalamin (VITAMIN B-12) 2500 MCG SUBL Place 1 tablet under the tongue daily. Taking Monday through Friday   cyanocobalamin 100 MCG tablet Take by mouth.   diphenhydrAMINE (BENADRYL) 25 mg capsule Take by mouth.   diphenhydrAMINE (BENADRYL) 25 mg capsule TAKE ONE CAPSULE BY MOUTH AT BEDTIME FOR NIGHTTIME CONGESTION * MAY CAUSE DROWSINESS *   glimepiride (AMARYL) 4 MG tablet Take 1  tablet by mouth 2 (two) times daily.   hydrochlorothiazide (HYDRODIURIL) 25 MG tablet Take 25 mg by mouth daily.    insulin glargine (LANTUS) 100 UNIT/ML injection Inject into the skin.   insulin glargine-yfgn (SEMGLEE) 100 UNIT/ML injection INJECT 10 UNITS SUBCUTANEOUSLY AT BEDTIME FOR DIABETES (USE WITHIN 28 DAYS AFTER OPENING VIAL)  START WITH 8 UNITS AT NIGHT AND AFTER 2 DAYS INCREASE TO 10 UNITS EVERY  DAY; INCREASE BY 2 UNITS EVERY 2ND DAY UNTIL FBG REMAINS AT 120-150 (CONVERTED FROM LANTUS) FOR DIABETES (USE WITHIN 28 DAYS AFTER OPENING VIAL)  START WITH 8 UNITS AT NIGHT AND AFTER 2 DAYS INCREASE TO 10 UNITS EVERY   DAY; INCREASE BY 2 UNITS EVERY 2ND DAY UNTIL FBG REMAINS AT 120-150 (CONVERTED FROM LANTUS)   lisinopril (PRINIVIL,ZESTRIL) 40 MG tablet Take 40 mg by mouth daily.    metFORMIN (GLUCOPHAGE) 500 MG tablet Take by mouth.   metFORMIN (GLUCOPHAGE) 500 MG tablet TAKE TWO TABLETS BY MOUTH BEFORE BREAKFAST AND TAKE ONE TABLET BEFORE SUPPER FOR DIABETES (ANNUAL KIDNEY FUNCTION TESTING IS NEEDED)   neomycin-polymyxin-hydrocortisone (CORTISPORIN) OTIC solution Apply 1-2 drops to toenails at bedtime once daily   pregabalin (LYRICA) 100 MG capsule Take 1 capsule by mouth twice daily   rosuvastatin (CRESTOR) 20 MG tablet Take 0.5 tablets by mouth daily.   sitaGLIPtin-metformin (JANUMET) 50-1000 MG tablet Take 1 tablet by mouth 2 (two) times daily with a meal.   terbinafine (LAMISIL) 250 MG tablet Take 1 tablet (250 mg total) by mouth daily.   Vitamin D, Cholecalciferol, 25 MCG (1000 UT) CAPS  Take by mouth. 1000 units Monday-Friday, 2000 units Saturday and Sunday   No facility-administered encounter medications on file as of 06/17/2021.   Reviewed chart prior to disease state call. Spoke with patient regarding BP  Recent Office Vitals: BP Readings from Last 3 Encounters:  04/16/21 128/60  04/16/21 128/60  11/17/20 134/76   Pulse Readings from Last 3 Encounters:  04/16/21 97  04/16/21 97   11/17/20 95    Wt Readings from Last 3 Encounters:  04/16/21 226 lb 6.6 oz (102.7 kg)  04/16/21 226 lb 6.4 oz (102.7 kg)  11/17/20 224 lb (101.6 kg)     Kidney Function Lab Results  Component Value Date/Time   CREATININE 1.19 04/16/2021 04:23 PM   CREATININE 1.20 11/17/2020 02:21 PM   GFRNONAA 65 08/13/2020 04:51 PM   GFRAA 76 08/13/2020 04:51 PM    BMP Latest Ref Rng & Units 04/16/2021 11/17/2020 08/13/2020  Glucose 65 - 99 mg/dL 141(H) 118(H) 119(H)  BUN 8 - 27 mg/dL 9 11 12  Creatinine 0.76 - 1.27 mg/dL 1.19 1.20 1.15  BUN/Creat Ratio 10 - 24 8(L) 9(L) 10  Sodium 134 - 144 mmol/L 140 144 138  Potassium 3.5 - 5.2 mmol/L 4.6 4.3 4.5  Chloride 96 - 106 mmol/L 100 103 97  CO2 20 - 29 mmol/L 24 21 26  Calcium 8.6 - 10.2 mg/dL 9.6 9.9 9.8     10 -01-2021: 1st attempt left VM 06-25-2021: 2nd attempt left VM 06-29-2021: 3rd attempt left VM with patient and daughter  Care Gaps: AWV 05-03-2022 Yearly ophthalmology exam overdue last completed 06-25-2020 Covid booster overdue last completed 01-30-2021 Flu vaccine overdue   Star Rating Drugs: Atorvastatin 40 mg- no fill history Lisinopril 40 mg- no fill history Janumet 50-1000 mg- Last filled 06-15-2021 90 DS [facility] Metformin 500 mg- Last filled 08-14-2020 No fill history  14-10-2019 [facility] Clinical Pharmacist Assistant [phone]

## 2021-08-19 ENCOUNTER — Telehealth: Payer: Self-pay

## 2021-08-19 ENCOUNTER — Encounter: Payer: Medicare HMO | Admitting: Internal Medicine

## 2021-08-19 NOTE — Telephone Encounter (Signed)
I left the pt a message that I was calling to reschedule the appt that the pt missed for today.

## 2021-08-19 NOTE — Progress Notes (Signed)
Erroneous - no show

## 2021-09-08 ENCOUNTER — Telehealth: Payer: Self-pay

## 2021-09-08 ENCOUNTER — Ambulatory Visit: Payer: Medicare HMO | Admitting: Nurse Practitioner

## 2021-09-08 NOTE — Telephone Encounter (Signed)
I left the patient a message that his appointment for today needed to be rescheduled due to unforseen circumstances

## 2021-09-10 ENCOUNTER — Ambulatory Visit (INDEPENDENT_AMBULATORY_CARE_PROVIDER_SITE_OTHER): Payer: Medicare HMO | Admitting: Nurse Practitioner

## 2021-09-10 ENCOUNTER — Other Ambulatory Visit: Payer: Self-pay

## 2021-09-10 VITALS — BP 130/78 | HR 84 | Temp 98.0°F | Ht 68.8 in | Wt 214.0 lb

## 2021-09-10 DIAGNOSIS — Z6831 Body mass index (BMI) 31.0-31.9, adult: Secondary | ICD-10-CM

## 2021-09-10 DIAGNOSIS — E6609 Other obesity due to excess calories: Secondary | ICD-10-CM

## 2021-09-10 DIAGNOSIS — E7800 Pure hypercholesterolemia, unspecified: Secondary | ICD-10-CM

## 2021-09-10 DIAGNOSIS — I10 Essential (primary) hypertension: Secondary | ICD-10-CM

## 2021-09-10 DIAGNOSIS — E1141 Type 2 diabetes mellitus with diabetic mononeuropathy: Secondary | ICD-10-CM | POA: Diagnosis not present

## 2021-09-10 NOTE — Patient Instructions (Signed)

## 2021-09-10 NOTE — Progress Notes (Signed)
I,Tianna Badgett,acting as a Education administrator for [employer], NP.,have documented all relevant documentation on the behalf of [employer], NP,as directed by  Bary Castilla, NP while in the presence of Bary Castilla, NP.  This visit occurred during the SARS-CoV-2 public health emergency.  Safety protocols were in place, including screening questions prior to the visit, additional usage of staff PPE, and extensive cleaning of exam room while observing appropriate contact time as indicated for disinfecting solutions.  Subjective:     Patient ID: Marcus Davis , male    DOB: 12-Jan-1951 , 70 y.o.   MRN: [id number]   Chief Complaint  Patient presents with   Diabetes    HPI  Patient here for a f/u on his HTN and DM. He is also followed by the [place]. He reports compliance with meds. He denies headaches, chest pain and shortness of breath. He has no specific concerns at this time. He checks his BS at home. It averages anything between 96-120.    He did see the eye doctor.    Diabetes He presents for his follow-up diabetic visit. He has type 2 diabetes mellitus. There are no hypoglycemic associated symptoms. Pertinent negatives for hypoglycemia include no dizziness or headaches. There are no diabetic associated symptoms. Pertinent negatives for diabetes include no blurred vision, no chest pain and no weakness. There are no hypoglycemic complications. Risk factors for coronary artery disease include diabetes mellitus, dyslipidemia, hypertension, male sex and sedentary lifestyle. He is compliant with treatment most of the time. He is following a diabetic diet. He participates in exercise intermittently. Eye exam is current.  Hypertension This is a chronic problem. The current episode started more than 1 year ago. The problem has been gradually improving since onset. The problem is controlled. Pertinent negatives include no blurred vision, chest pain, headaches, palpitations or shortness of breath.  The current treatment provides moderate improvement.    Past Medical History:  Diagnosis Date   Allergy    DM (diabetes mellitus) ([place])    HTN (hypertension)    Hyperlipidemia      Family History  Problem Relation Age of Onset   Diabetes Mother    Hypertension Mother    Stroke Mother    Hypertension Father    Diabetes Father    Stroke Paternal Grandfather      Current Outpatient Medications:    Alcohol Swabs (ALCOHOL WIPES) 70 % PADS, USE PAD TO AFFECTED AREA DAILY, Disp: , Rfl:    amLODipine (NORVASC) 10 MG tablet, Take by mouth., Disp: , Rfl:    amLODipine (NORVASC) 10 MG tablet, Take by mouth., Disp: , Rfl:    amLODipine (NORVASC) 10 MG tablet, TAKE ONE TABLET BY MOUTH DAILY FOR HEART, Disp: , Rfl:    aspirin EC 81 MG tablet, Take 81 mg by mouth daily., Disp: , Rfl:    atorvastatin (LIPITOR) 40 MG tablet, Take 40 mg by mouth daily. 1/2 tablet daily, Disp: , Rfl:    atorvastatin (LIPITOR) 40 MG tablet, Take by mouth., Disp: , Rfl:    atorvastatin (LIPITOR) 40 MG tablet, TAKE ONE-HALF TABLET BY MOUTH AT BEDTIME FOR CHOLESTEROL *REPLACES ROSUVASTATIN (CRESTOR)*, Disp: , Rfl:    Cyanocobalamin (VITAMIN B-12) 2500 MCG SUBL, Place 1 tablet under the tongue daily. Taking Monday through Friday, Disp: , Rfl:    cyanocobalamin 100 MCG tablet, Take by mouth., Disp: , Rfl:    diphenhydrAMINE (BENADRYL) 25 mg capsule, Take by mouth., Disp: , Rfl:    diphenhydrAMINE (BENADRYL) 25 mg capsule,  TAKE ONE CAPSULE BY MOUTH AT BEDTIME FOR NIGHTTIME CONGESTION * MAY CAUSE DROWSINESS *, Disp: , Rfl:    hydrochlorothiazide (HYDRODIURIL) 25 MG tablet, Take 25 mg by mouth daily. , Disp: , Rfl:    lisinopril (PRINIVIL,ZESTRIL) 40 MG tablet, Take 40 mg by mouth daily. , Disp: , Rfl:    metFORMIN (GLUCOPHAGE) 500 MG tablet, Take by mouth., Disp: , Rfl:    metFORMIN (GLUCOPHAGE) 500 MG tablet, TAKE TWO TABLETS BY MOUTH BEFORE BREAKFAST AND TAKE ONE TABLET BEFORE SUPPER FOR DIABETES (ANNUAL KIDNEY FUNCTION  TESTING IS NEEDED), Disp: , Rfl:    neomycin-polymyxin-hydrocortisone (CORTISPORIN) OTIC solution, Apply 1-2 drops to toenails at bedtime once daily, Disp: 10 mL, Rfl: 3   rosuvastatin (CRESTOR) 20 MG tablet, Take 0.5 tablets by mouth daily., Disp: , Rfl:    sitaGLIPtin-metformin (JANUMET) 50-1000 MG tablet, Take 1 tablet by mouth 2 (two) times daily with a meal., Disp: 180 tablet, Rfl: 1   terbinafine (LAMISIL) 250 MG tablet, Take 1 tablet (250 mg total) by mouth daily., Disp: 90 tablet, Rfl: 0   Vitamin D, Cholecalciferol, 25 MCG (1000 UT) CAPS, Take by mouth. 1000 units Monday-Friday, 2000 units Saturday and Sunday, Disp: , Rfl:    glimepiride (AMARYL) 4 MG tablet, Take 1 tablet by mouth 2 (two) times daily., Disp: , Rfl:    insulin glargine (LANTUS) 100 UNIT/ML injection, Inject 36 Units into the skin., Disp: , Rfl:    Allergies  Allergen Reactions   Pollen Extract Other (See Comments)    Sneezing, runny nose, congestion,      Review of Systems  Constitutional: Negative.  Negative for chills and fever.  HENT:  Negative for congestion.   Eyes:  Negative for blurred vision.  Respiratory: Negative.  Negative for cough, shortness of breath and wheezing.   Cardiovascular: Negative.  Negative for chest pain and palpitations.  Gastrointestinal: Negative.  Negative for constipation and diarrhea.  Neurological: Negative.  Negative for dizziness, weakness and headaches.    Today's Vitals   09/10/21 1130  BP: 130/78  Pulse: 84  Temp: 98 F (36.7 C)  TempSrc: Oral  Weight: 214 lb (97.1 kg)  Height: 5' 8.8" (1.748 m)   Body mass index is 31.79 kg/m.  Wt Readings from Last 3 Encounters:  09/10/21 214 lb (97.1 kg)  04/16/21 226 lb 6.6 oz (102.7 kg)  04/16/21 226 lb 6.4 oz (102.7 kg)    Objective:  Physical Exam Constitutional:      Appearance: Normal appearance. He is obese.  HENT:     Head: Normocephalic and atraumatic.  Cardiovascular:     Rate and Rhythm: Normal rate and  regular rhythm.     Pulses: Normal pulses.     Heart sounds: Normal heart sounds. No murmur heard. Pulmonary:     Effort: Pulmonary effort is normal. No respiratory distress.     Breath sounds: Normal breath sounds. No wheezing.  Skin:    General: Skin is warm and dry.     Capillary Refill: Capillary refill takes less than 2 seconds.  Neurological:     Mental Status: He is alert and oriented to person, place, and time.        Assessment And Plan:     1. Type 2 diabetes mellitus with diabetic mononeuropathy, without long-term current use of insulin (HCC) -Chronic, stable -Continue meds  --Discussed with patient the importance of glycemic control and long term complications from uncontrolled diabetes. Discussed with the patient the importance of compliance with  home glucose monitoring, diet which includes decrease amount of sugary drinks and foods. Importance of exercise was also discussed with the patient. Importance of eye exams, self foot care and compliance to office visits was also discussed with the patient.  - Hemoglobin A1c  2. Essential hypertension -Chronic, stable -Continue meds  -Limit the intake of processed foods and salt intake. You should increase your intake of green vegetables and fruits. Limit the use of alcohol. Limit fast foods and fried foods. Avoid high fatty saturated and trans fat foods. Keep yourself hydrated with drinking water. Avoid red meats. Eat lean meats instead. Exercise for atleast 30-45 min for atleast 4-5 times a week.  - CMP14+EGFR - CBC no Diff  3. Pure hypercholesterolemia -Chronic, chronic meds --Educated patient about a diet that is low in fat and high fatty foods including dairy products. Increase in take of fish and fiber. Decrease intake of red meats and fast foods. Exercise for atleast 4-5 times a week or atleast 30-45 min. Drink a lot of water.   - Lipid panel  4. Class 1 obesity due to excess calories with serious comorbidity and body  mass index (BMI) of 31.0 to 31.9 in adult -Advised patient on a healthy diet including avoiding fast food and red meats. Increase the intake of lean meats including grilled chicken and [place].  Drink a lot of water. Decrease intake of fatty foods. Exercise for 30-45 min. 4-5 a week to decrease the risk of cardiac event.   The patient was encouraged to call or send a message through [place] for any questions or concerns.   Follow up: if symptoms persist or do not get better.   Side effects and appropriate use of all the medication(s) were discussed with the patient today. Patient advised to use the medication(s) as directed by their healthcare provider. The patient was encouraged to read, review, and understand all associated package inserts and contact our office with any questions or concerns. The patient accepts the risks of the treatment plan and had an opportunity to ask questions.   Patient was given opportunity to ask questions. Patient verbalized understanding of the plan and was able to repeat key elements of the plan. All questions were answered to their satisfaction.  Marcus Yazid Pop, DNP   I, Marcus Davis have reviewed all documentation for this visit. The documentation on 09/10/21 for the exam, diagnosis, procedures, and orders are all accurate and complete.    IF YOU HAVE BEEN REFERRED TO A SPECIALIST, IT MAY TAKE 1-2 WEEKS TO SCHEDULE/PROCESS THE REFERRAL. IF YOU HAVE NOT HEARD FROM US/SPECIALIST IN TWO WEEKS, PLEASE GIVE [place] A CALL AT [phone].   THE PATIENT IS ENCOURAGED TO PRACTICE SOCIAL DISTANCING DUE TO THE COVID-19 PANDEMIC.

## 2021-09-11 LAB — CMP14+EGFR
ALT: 25 IU/L (ref 0–44)
AST: 18 IU/L (ref 0–40)
Albumin/Globulin Ratio: 2 (ref 1.2–2.2)
Albumin: 4.6 g/dL (ref 3.8–4.8)
Alkaline Phosphatase: 80 IU/L (ref 44–121)
BUN/Creatinine Ratio: 9 — ABNORMAL LOW (ref 10–24)
BUN: 9 mg/dL (ref 8–27)
Bilirubin Total: 0.3 mg/dL (ref 0.0–1.2)
CO2: 25 mmol/L (ref 20–29)
Calcium: 9.6 mg/dL (ref 8.6–10.2)
Chloride: 100 mmol/L (ref 96–106)
Creatinine, Ser: 1 mg/dL (ref 0.76–1.27)
Globulin, Total: 2.3 g/dL (ref 1.5–4.5)
Glucose: 133 mg/dL — ABNORMAL HIGH (ref 70–99)
Potassium: 4.4 mmol/L (ref 3.5–5.2)
Sodium: 140 mmol/L (ref 134–144)
Total Protein: 6.9 g/dL (ref 6.0–8.5)
eGFR: 82 mL/min/{1.73_m2} (ref >59)

## 2021-09-11 LAB — LIPID PANEL
Chol/HDL Ratio: 3.5 ratio (ref 0.0–5.0)
Cholesterol, Total: 101 mg/dL (ref 100–199)
HDL: 29 mg/dL — ABNORMAL LOW (ref >39)
LDL Chol Calc (NIH): 39 mg/dL (ref 0–99)
Triglycerides: 204 mg/dL — ABNORMAL HIGH (ref 0–149)
VLDL Cholesterol Cal: 33 mg/dL (ref 5–40)

## 2021-09-11 LAB — CBC
Hematocrit: 37.3 % — ABNORMAL LOW (ref 37.5–51.0)
Hemoglobin: 12.8 g/dL — ABNORMAL LOW (ref 13.0–17.7)
MCH: 30 pg (ref 26.6–33.0)
MCHC: 34.3 g/dL (ref 31.5–35.7)
MCV: 88 fL (ref 79–97)
Platelets: 260 10*3/uL (ref 150–450)
RBC: 4.26 x10E6/uL (ref 4.14–5.80)
RDW: 13.6 % (ref 11.6–15.4)
WBC: 6.5 10*3/uL (ref 3.4–10.8)

## 2021-09-11 LAB — HEMOGLOBIN A1C
Est. average glucose Bld gHb Est-mCnc: 177 mg/dL
Hgb A1c MFr Bld: 7.8 % — ABNORMAL HIGH (ref 4.8–5.6)

## 2021-09-17 ENCOUNTER — Telehealth: Payer: Self-pay

## 2021-09-17 NOTE — Progress Notes (Signed)
Chronic Care Management Pharmacy Assistant   Name: Marcus Davis  MRN: [id number] DOB: March 23, 1951   Reason for Encounter: Disease State/ Diabetes Adherence Call    Recent office visits:   09/10/2021 Charlesetta Ivory NP (PCP) stop Insulin Glargine, Pregabalin  09/08/2021 Kari Baars, [place], Tim Lair ([place]) encounter unavailable to view  09/04/2021 Mal Amabile MD ([facility]) encounter unavailable to view  08/19/2021 Mal Amabile MD ([facility]) encounter unavailable to view  08/05/2021 Hloy Green MD ([facility]) encounter unavailable to view   Recent consult visits: None  Hospital visits:  None in previous 6 months  Medications: Outpatient Encounter Medications as of 09/17/2021  Medication Sig   Alcohol Swabs (ALCOHOL WIPES) 70 % PADS USE PAD TO AFFECTED AREA DAILY   amLODipine (NORVASC) 10 MG tablet Take by mouth.   amLODipine (NORVASC) 10 MG tablet Take by mouth.   amLODipine (NORVASC) 10 MG tablet TAKE ONE TABLET BY MOUTH DAILY FOR HEART   aspirin EC 81 MG tablet Take 81 mg by mouth daily.   atorvastatin (LIPITOR) 40 MG tablet Take 40 mg by mouth daily. 1/2 tablet daily   atorvastatin (LIPITOR) 40 MG tablet Take by mouth.   atorvastatin (LIPITOR) 40 MG tablet TAKE ONE-HALF TABLET BY MOUTH AT BEDTIME FOR CHOLESTEROL *REPLACES ROSUVASTATIN (CRESTOR)*   Cyanocobalamin (VITAMIN B-12) 2500 MCG SUBL Place 1 tablet under the tongue daily. Taking Monday through Friday   cyanocobalamin 100 MCG tablet Take by mouth.   diphenhydrAMINE (BENADRYL) 25 mg capsule Take by mouth.   diphenhydrAMINE (BENADRYL) 25 mg capsule TAKE ONE CAPSULE BY MOUTH AT BEDTIME FOR NIGHTTIME CONGESTION * MAY CAUSE DROWSINESS *   glimepiride (AMARYL) 4 MG tablet Take 1 tablet by mouth 2 (two) times daily.   hydrochlorothiazide (HYDRODIURIL) 25 MG tablet Take 25 mg by mouth daily.    insulin glargine (LANTUS) 100 UNIT/ML injection Inject 36 Units into the skin.   lisinopril (PRINIVIL,ZESTRIL) 40 MG tablet Take 40 mg by mouth  daily.    metFORMIN (GLUCOPHAGE) 500 MG tablet Take by mouth.   metFORMIN (GLUCOPHAGE) 500 MG tablet TAKE TWO TABLETS BY MOUTH BEFORE BREAKFAST AND TAKE ONE TABLET BEFORE SUPPER FOR DIABETES (ANNUAL KIDNEY FUNCTION TESTING IS NEEDED)   neomycin-polymyxin-hydrocortisone (CORTISPORIN) OTIC solution Apply 1-2 drops to toenails at bedtime once daily   rosuvastatin (CRESTOR) 20 MG tablet Take 0.5 tablets by mouth daily.   sitaGLIPtin-metformin (JANUMET) 50-1000 MG tablet Take 1 tablet by mouth 2 (two) times daily with a meal.   terbinafine (LAMISIL) 250 MG tablet Take 1 tablet (250 mg total) by mouth daily.   Vitamin D, Cholecalciferol, 25 MCG (1000 UT) CAPS Take by mouth. 1000 units Monday-Friday, 2000 units Saturday and Sunday   No facility-administered encounter medications on file as of 09/17/2021.   Recent Relevant Labs: Lab Results  Component Value Date/Time   HGBA1C 7.8 (H) 09/10/2021 11:53 AM   HGBA1C 8.2 (H) 04/16/2021 04:23 PM   MICROALBUR 30 04/16/2021 04:06 PM   MICROALBUR 80 04/09/2020 03:42 PM    Kidney Function Lab Results  Component Value Date/Time   CREATININE 1.00 09/10/2021 11:53 AM   CREATININE 1.19 04/16/2021 04:23 PM   GFRNONAA 65 08/13/2020 04:51 PM   GFRAA 76 08/13/2020 04:51 PM     Called patient left message 09/17/2020 Called patient left message 09/18/2020 Called patient left message 09/22/2020  Care Gaps:  AWV 05-03-2022 Yearly ophthalmology exam overdue last completed 06-25-2020 Covid booster overdue last completed 01-30-2021 Flu vaccine overdue   Star Rating Drugs:  Medication:  Last Fill: [facility] Atorvastatin 40 mg- no fill history Lisinopril 40 mg- no fill history Janumet 50-1000 mg    10/03, 12/24    90 DS [facility] Metformin 500 mg-        08-14-2020 No fill history    Adreka Arnot [facility] Clinical Pharmacist Assistant [phone]

## 2021-09-21 HISTORY — PX: CATARACT EXTRACTION: SUR2

## 2021-12-09 ENCOUNTER — Telehealth: Payer: Self-pay

## 2021-12-09 NOTE — Chronic Care Management (AMB) (Signed)
? ? ?Chronic Care Management ?Pharmacy Assistant  ? ?Name: Marcus Davis  MRN: [id number] DOB: Jul 15, 1951 ? ?Reason for Encounter: Disease State/ Hypertension ? ?Recent office visits:  ?12-29-20222 Charlesetta Ivory, NP. Hemo= 12.8, Hema= 37.3. Glucose= 133, BUN/Creatinine= 9. A1C= 7.8. Trig= 204, HDL= 29. STOP SEMGLEE insulin and lyrica. ? ?Recent consult visits:  ?10-02-2021 Anthoney Harada, [place] ([facility] Post op visit for Cataract extraction of left eye. ? ?09-22-2021  Anthoney Harada, [place] ([facility]). Post op visit for Cataract extraction of left eye. ? ?09-21-2021  Anthoney Harada, [place] ([facility]). XCAPSL CTRC RMVL W/O ECP procedure completed. ? ?09-15-2021 Florian Buff, MD ([facility]). Podiatry visit. Diabetic shoes and insoles ordered. ? ?09-08-2021 Celene Squibb, MD ([facility]). Unable to view encounter. ? ?09-04-2021 Arsenio Katz, MD ([facility]). Unable to view encounter. ? ?08-19-2021 Arsenio Katz, MD ([facility]). Unable to view encounter. ? ?08-05-2021 Charise Killian, MD ([facility]). Unable to view encounter. ? ?Hospital visits:  ?None in previous 6 months ? ?Medications: ?Outpatient Encounter Medications as of 12/09/2021  ?Medication Sig  ? Alcohol Swabs (ALCOHOL WIPES) 70 % PADS USE PAD TO AFFECTED AREA DAILY  ? amLODipine (NORVASC) 10 MG tablet Take by mouth.  ? amLODipine (NORVASC) 10 MG tablet Take by mouth.  ? amLODipine (NORVASC) 10 MG tablet TAKE ONE TABLET BY MOUTH DAILY FOR HEART  ? aspirin EC 81 MG tablet Take 81 mg by mouth daily.  ? atorvastatin (LIPITOR) 40 MG tablet Take 40 mg by mouth daily. 1/2 tablet daily  ? atorvastatin (LIPITOR) 40 MG tablet Take by mouth.  ? atorvastatin (LIPITOR) 40 MG tablet TAKE ONE-HALF TABLET BY MOUTH AT BEDTIME FOR CHOLESTEROL *REPLACES ROSUVASTATIN (CRESTOR)*  ? Cyanocobalamin (VITAMIN B-12) 2500 MCG SUBL Place 1 tablet under the tongue daily. Taking Monday through Friday  ? cyanocobalamin 100 MCG tablet Take by mouth.  ? diphenhydrAMINE (BENADRYL) 25 mg capsule Take by mouth.  ? diphenhydrAMINE (BENADRYL)  25 mg capsule TAKE ONE CAPSULE BY MOUTH AT BEDTIME FOR NIGHTTIME CONGESTION * MAY CAUSE DROWSINESS *  ? glimepiride (AMARYL) 4 MG tablet Take 1 tablet by mouth 2 (two) times daily.  ? hydrochlorothiazide (HYDRODIURIL) 25 MG tablet Take 25 mg by mouth daily.   ? insulin glargine (LANTUS) 100 UNIT/ML injection Inject 36 Units into the skin.  ? lisinopril (PRINIVIL,ZESTRIL) 40 MG tablet Take 40 mg by mouth daily.   ? metFORMIN (GLUCOPHAGE) 500 MG tablet Take by mouth.  ? metFORMIN (GLUCOPHAGE) 500 MG tablet TAKE TWO TABLETS BY MOUTH BEFORE BREAKFAST AND TAKE ONE TABLET BEFORE SUPPER FOR DIABETES (ANNUAL KIDNEY FUNCTION TESTING IS NEEDED)  ? neomycin-polymyxin-hydrocortisone (CORTISPORIN) OTIC solution Apply 1-2 drops to toenails at bedtime once daily  ? rosuvastatin (CRESTOR) 20 MG tablet Take 0.5 tablets by mouth daily.  ? sitaGLIPtin-metformin (JANUMET) 50-1000 MG tablet Take 1 tablet by mouth 2 (two) times daily with a meal.  ? terbinafine (LAMISIL) 250 MG tablet Take 1 tablet (250 mg total) by mouth daily.  ? Vitamin D, Cholecalciferol, 25 MCG (1000 UT) CAPS Take by mouth. 1000 units Monday-Friday, 2000 units Saturday and Sunday  ? ?No facility-administered encounter medications on file as of 12/09/2021.  ? ?Reviewed chart prior to disease state call. Spoke with patient regarding BP ? ?Recent Office Vitals: ?BP Readings from Last 3 Encounters:  ?09/10/21 130/78  ?04/16/21 128/60  ?04/16/21 128/60  ? ?Pulse Readings from Last 3 Encounters:  ?09/10/21 84  ?04/16/21 97  ?04/16/21 97  ?  ?Wt Readings from Last 3 Encounters:  ?09/10/21 214 lb (97.1 kg)  ?  04/16/21 226 lb 6.6 oz (102.7 kg)  ?04/16/21 226 lb 6.4 oz (102.7 kg)  ?  ? ?Kidney Function ?Lab Results  ?Component Value Date/Time  ? CREATININE 1.00 09/10/2021 11:53 AM  ? CREATININE 1.19 04/16/2021 04:23 PM  ? GFRNONAA 65 08/13/2020 04:51 PM  ? GFRAA 76 08/13/2020 04:51 PM  ? ? ? ?  Latest Ref Rng & Units 09/10/2021  ? 11:53 AM 04/16/2021  ?  4:23 PM 11/17/2020  ?  2:21  PM  ?BMP  ?Glucose 70 - 99 mg/dL 258   527   782    ?BUN 8 - 27 mg/dL 9   9   11     ?Creatinine 0.76 - 1.27 mg/dL   4.23   5.36    ?BUN/Creat Ratio 10 - 24 9   8   9     ?Sodium 134 - 144 mmol/L 140   140   144    ?Potassium 3.5 - 5.2 mmol/L 4.4   4.6   4.3    ?Chloride 96 - 106 mmol/L 100   100   103    ?CO2 20 - 29 mmol/L 25   24   21     ?Calcium 8.6 - 10.2 mg/dL 9.6   9.6   9.9    ? ? ?12-09-2021: 1st attempt left VM ?12-11-2021: 2nd attempt left VM ? ?Care Gaps: ?PNA Vac overdue ?Yearly foot exam  ?AWV 05-13-2022 ? ?Star Rating Drugs: ?Janumet 50-1000 mg- Last filled 09-05-2021 90 DS [facility] ?Metformin 500 mg- Last filled  ?Lisinopril 40 mg- No fill history ?Atorvastatin 40 mg-  ? ?Malecca Hicks CMA ?Clinical Pharmacist Assistant ?[phone] ? ?

## 2022-01-14 ENCOUNTER — Telehealth: Payer: Self-pay

## 2022-01-14 NOTE — Chronic Care Management (AMB) (Signed)
Chronic Care Management Pharmacy Assistant   Name: Marcus Davis  MRN: [id number] DOB: 20-Nov-1950   Reason for Encounter: Disease State/ Diabetes  Recent office visits:  12-29-20222 Bary Castilla, NP. Hemo= 12.8, Hema= 37.3. Glucose= 133, BUN/Creatinine= 9. A1C= 7.8. Trig= 204, HDL= 29. STOP SEMGLEE insulin and lyrica  Recent consult visits:  10-02-2021 Madaline Savage, MD ([facility]). Post op visit for Cataract extraction of left eye.   09-22-2021 Madaline Savage, MD ([facility]). Post op visit for Cataract extraction of left eye.  09-21-2021  Rennie Plowman, MD ([facility]). XCAPSL CTRC RMVL W/O ECP procedure completed.  09-21-2021 Leonette Nutting, MD ([place]). Podiatry visit. Diabetic shoes and insoles ordered.  09-08-2021 LOM,SOKCHEATA, MD ([facility]). Unable to view encounter.  09-04-2021 PATEL,KRISTIN A, MD ([facility]). Unable to view encounter.   08-19-2021 PATEL,KRISTIN A, MD ([facility]). Unable to view encounter.   08-05-2021 Dema Severin, MD ([facility]). Unable to view encounter.  Hospital visits:  None in previous 6 months  Medications: Outpatient Encounter Medications as of 01/14/2022  Medication Sig   Alcohol Swabs (ALCOHOL WIPES) 70 % PADS USE PAD TO AFFECTED AREA DAILY   amLODipine (NORVASC) 10 MG tablet Take by mouth.   amLODipine (NORVASC) 10 MG tablet Take by mouth.   amLODipine (NORVASC) 10 MG tablet TAKE ONE TABLET BY MOUTH DAILY FOR HEART   aspirin EC 81 MG tablet Take 81 mg by mouth daily.   atorvastatin (LIPITOR) 40 MG tablet Take 40 mg by mouth daily. 1/2 tablet daily   atorvastatin (LIPITOR) 40 MG tablet Take by mouth.   atorvastatin (LIPITOR) 40 MG tablet TAKE ONE-HALF TABLET BY MOUTH AT BEDTIME FOR CHOLESTEROL *REPLACES ROSUVASTATIN (CRESTOR)*   Cyanocobalamin (VITAMIN B-12) 2500 MCG SUBL Place 1 tablet under the tongue daily. Taking Monday through Friday   cyanocobalamin 100 MCG tablet Take by mouth.   diphenhydrAMINE (BENADRYL) 25 mg capsule Take by  mouth.   diphenhydrAMINE (BENADRYL) 25 mg capsule TAKE ONE CAPSULE BY MOUTH AT BEDTIME FOR NIGHTTIME CONGESTION * MAY CAUSE DROWSINESS *   glimepiride (AMARYL) 4 MG tablet Take 1 tablet by mouth 2 (two) times daily.   hydrochlorothiazide (HYDRODIURIL) 25 MG tablet Take 25 mg by mouth daily.    insulin glargine (LANTUS) 100 UNIT/ML injection Inject 36 Units into the skin.   lisinopril (PRINIVIL,ZESTRIL) 40 MG tablet Take 40 mg by mouth daily.    metFORMIN (GLUCOPHAGE) 500 MG tablet Take by mouth.   metFORMIN (GLUCOPHAGE) 500 MG tablet TAKE TWO TABLETS BY MOUTH BEFORE BREAKFAST AND TAKE ONE TABLET BEFORE SUPPER FOR DIABETES (ANNUAL KIDNEY FUNCTION TESTING IS NEEDED)   neomycin-polymyxin-hydrocortisone (CORTISPORIN) OTIC solution Apply 1-2 drops to toenails at bedtime once daily   rosuvastatin (CRESTOR) 20 MG tablet Take 0.5 tablets by mouth daily.   sitaGLIPtin-metformin (JANUMET) 50-1000 MG tablet Take 1 tablet by mouth 2 (two) times daily with a meal.   terbinafine (LAMISIL) 250 MG tablet Take 1 tablet (250 mg total) by mouth daily.   Vitamin D, Cholecalciferol, 25 MCG (1000 UT) CAPS Take by mouth. 1000 units Monday-Friday, 2000 units Saturday and Sunday   No facility-administered encounter medications on file as of 01/14/2022.  Recent Relevant Labs: Lab Results  Component Value Date/Time   HGBA1C 7.8 (H) 09/10/2021 11:53 AM   HGBA1C 8.2 (H) 04/16/2021 04:23 PM   MICROALBUR 30 04/16/2021 04:06 PM   MICROALBUR 80 04/09/2020 03:42 PM    Kidney Function Lab Results  Component Value Date/Time   CREATININE 1.00 09/10/2021 11:53 AM  CREATININE 1.19 04/16/2021 04:23 PM   GFRNONAA 65 08/13/2020 04:51 PM   [id number] 08/13/2020 04:51 PM     01-14-2022: 1st attempt left VM 01-18-2022: 2nd attempt left VM 01-20-2022: 3rd attempt left VM    Star Rating Drugs: Janumet 50-1000 mg- Last filled 12-07-2021 90 [facility] Metformin 500 mg- Last filled  Lisinopril 40 mg-  Last filled Atorvastatin  40 mg- Last filled  [place] Pharmacist Assistant [phone]

## 2022-02-19 LAB — HM DIABETES EYE EXAM

## 2022-03-06 ENCOUNTER — Other Ambulatory Visit: Payer: Self-pay | Admitting: Internal Medicine

## 2022-05-13 ENCOUNTER — Encounter: Payer: Self-pay | Admitting: Internal Medicine

## 2022-05-13 ENCOUNTER — Ambulatory Visit (INDEPENDENT_AMBULATORY_CARE_PROVIDER_SITE_OTHER): Payer: Medicare HMO | Admitting: Internal Medicine

## 2022-05-13 ENCOUNTER — Ambulatory Visit (INDEPENDENT_AMBULATORY_CARE_PROVIDER_SITE_OTHER): Payer: Medicare HMO

## 2022-05-13 VITALS — BP 122/60 | HR 89 | Temp 98.4°F | Ht 69.0 in | Wt 216.6 lb

## 2022-05-13 VITALS — BP 122/60 | Temp 98.1°F | Ht 69.0 in | Wt 216.0 lb

## 2022-05-13 DIAGNOSIS — Z1211 Encounter for screening for malignant neoplasm of colon: Secondary | ICD-10-CM | POA: Diagnosis not present

## 2022-05-13 DIAGNOSIS — I10 Essential (primary) hypertension: Secondary | ICD-10-CM

## 2022-05-13 DIAGNOSIS — E6609 Other obesity due to excess calories: Secondary | ICD-10-CM

## 2022-05-13 DIAGNOSIS — Z0000 Encounter for general adult medical examination without abnormal findings: Secondary | ICD-10-CM

## 2022-05-13 DIAGNOSIS — Z6831 Body mass index (BMI) 31.0-31.9, adult: Secondary | ICD-10-CM

## 2022-05-13 DIAGNOSIS — E7800 Pure hypercholesterolemia, unspecified: Secondary | ICD-10-CM | POA: Diagnosis not present

## 2022-05-13 DIAGNOSIS — E1141 Type 2 diabetes mellitus with diabetic mononeuropathy: Secondary | ICD-10-CM

## 2022-05-13 LAB — POCT URINALYSIS DIPSTICK
Bilirubin, UA: NEGATIVE
Blood, UA: NEGATIVE
Glucose, UA: POSITIVE — AB
Ketones, UA: NEGATIVE
Leukocytes, UA: NEGATIVE
Nitrite, UA: NEGATIVE
Protein, UA: NEGATIVE
Spec Grav, UA: 1.02 (ref 1.010–1.025)
Urobilinogen, UA: 0.2 E.U./dL
pH, UA: 6 (ref 5.0–8.0)

## 2022-05-13 NOTE — Progress Notes (Signed)
Subjective:   Marcus Davis is a 71 y.o. male who presents for Medicare Annual/Subsequent preventive examination.  Review of Systems     Cardiac Risk Factors include: advanced age (>90men, >77 women);diabetes mellitus;dyslipidemia;hypertension;male gender;obesity (BMI >30kg/m2)     Objective:    Today's Vitals   05/13/22 1408  BP: 122/60  Pulse: 89  Temp: 98.4 F (36.9 C)  TempSrc: Oral  SpO2: 98%  Weight: 216 lb 9.6 oz (98.2 kg)  Height: 5\' 9"  (1.753 m)   Body mass index is 31.99 kg/m.     05/13/2022    2:21 PM 04/16/2021    2:15 PM 04/09/2020    2:29 PM 02/07/2020    2:14 PM 07/17/2019    9:38 AM 01/30/2019   11:20 AM  Advanced Directives  Does Patient Have a Medical Advance Directive? No No No No No No  Would patient like information on creating a medical advance directive? No - Patient declined Yes (MAU/Ambulatory/Procedural Areas - Information given)  Yes (MAU/Ambulatory/Procedural Areas - Information given) No - Patient declined No - Patient declined    Current Medications (verified) Outpatient Encounter Medications as of 05/13/2022  Medication Sig   Alcohol Swabs (ALCOHOL WIPES) 70 % PADS USE PAD TO AFFECTED AREA DAILY   amLODipine (NORVASC) 10 MG tablet Take by mouth.   aspirin EC 81 MG tablet Take 81 mg by mouth daily.   atorvastatin (LIPITOR) 40 MG tablet Take 40 mg by mouth daily. 1/2 tablet daily   Cyanocobalamin (VITAMIN B-12) 2500 MCG SUBL Place 1 tablet under the tongue daily. Taking Monday through Friday   diphenhydrAMINE (BENADRYL) 25 mg capsule Take by mouth.   hydrochlorothiazide (HYDRODIURIL) 25 MG tablet Take 25 mg by mouth daily.    insulin glargine (LANTUS) 100 UNIT/ML injection Inject 36 Units into the skin.   JANUMET 50-1000 MG tablet TAKE 1 TABLET BY MOUTH TWICE DAILY WITH A MEAL   lisinopril (PRINIVIL,ZESTRIL) 40 MG tablet Take 40 mg by mouth daily.    metFORMIN (GLUCOPHAGE) 500 MG tablet TAKE TWO TABLETS BY MOUTH BEFORE BREAKFAST AND TAKE ONE  TABLET BEFORE SUPPER FOR DIABETES (ANNUAL KIDNEY FUNCTION TESTING IS NEEDED)   neomycin-polymyxin-hydrocortisone (CORTISPORIN) OTIC solution Apply 1-2 drops to toenails at bedtime once daily   Vitamin D, Cholecalciferol, 25 MCG (1000 UT) CAPS Take by mouth. 1000 units Monday-Friday, 2000 units Saturday and Sunday   amLODipine (NORVASC) 10 MG tablet Take by mouth.   amLODipine (NORVASC) 10 MG tablet TAKE ONE TABLET BY MOUTH DAILY FOR HEART   atorvastatin (LIPITOR) 40 MG tablet Take by mouth.   atorvastatin (LIPITOR) 40 MG tablet TAKE ONE-HALF TABLET BY MOUTH AT BEDTIME FOR CHOLESTEROL *REPLACES ROSUVASTATIN (CRESTOR)*   cyanocobalamin 100 MCG tablet Take by mouth.   diphenhydrAMINE (BENADRYL) 25 mg capsule TAKE ONE CAPSULE BY MOUTH AT BEDTIME FOR NIGHTTIME CONGESTION * MAY CAUSE DROWSINESS *   glimepiride (AMARYL) 4 MG tablet Take 1 tablet by mouth 2 (two) times daily. (Patient not taking: Reported on 05/13/2022)   metFORMIN (GLUCOPHAGE) 500 MG tablet Take by mouth.   rosuvastatin (CRESTOR) 20 MG tablet Take 0.5 tablets by mouth daily. (Patient not taking: Reported on 05/13/2022)   terbinafine (LAMISIL) 250 MG tablet Take 1 tablet (250 mg total) by mouth daily. (Patient not taking: Reported on 05/13/2022)   No facility-administered encounter medications on file as of 05/13/2022.    Allergies (verified) Pollen extract   History: Past Medical History:  Diagnosis Date   Allergy    DM (diabetes mellitus) (  HCC)    HTN (hypertension)    Hyperlipidemia    Past Surgical History:  Procedure Laterality Date   CATARACT EXTRACTION Right 2019   CATARACT EXTRACTION Left 09/21/2021   TONSILLECTOMY     age 68   Family History  Problem Relation Age of Onset   Diabetes Mother    Hypertension Mother    Stroke Mother    Hypertension Father    Diabetes Father    Stroke Paternal Grandfather    Social History   Socioeconomic History   Marital status: Widowed    Spouse name: Not on file   Number  of children: Not on file   Years of education: Not on file   Highest education level: Not on file  Occupational History   Occupation: retired  Tobacco Use   Smoking status: Never   Smokeless tobacco: Never  Vaping Use   Vaping Use: Never used  Substance and Sexual Activity   Alcohol use: Not Currently    Comment: occasionally   Drug use: Never   Sexual activity: Not Currently  Other Topics Concern   Not on file  Social History Narrative   Not on file   Social Determinants of Health   Financial Resource Strain: Low Risk  (05/13/2022)   Overall Financial Resource Strain (CARDIA)    Difficulty of Paying Living Expenses: Not hard at all  Food Insecurity: No Food Insecurity (05/13/2022)   Hunger Vital Sign    Worried About Running Out of Food in the Last Year: Never true    Ran Out of Food in the Last Year: Never true  Transportation Needs: No Transportation Needs (05/13/2022)   PRAPARE - Administrator, Civil Service (Medical): No    Lack of Transportation (Non-Medical): No  Physical Activity: Sufficiently Active (05/13/2022)   Exercise Vital Sign    Days of Exercise per Week: 7 days    Minutes of Exercise per Session: 60 min  Stress: No Stress Concern Present (05/13/2022)   [employer] of Occupational Health - Occupational Stress Questionnaire    Feeling of Stress : Not at all  Social Connections: Not on file    Tobacco Counseling Counseling given: Not Answered   Clinical Intake:  Pre-visit preparation completed: Yes  Pain : No/denies pain     Nutritional Status: BMI > 30  Obese Nutritional Risks: None Diabetes: Yes  How often do you need to have someone help you when you read instructions, pamphlets, or other written materials from your doctor or pharmacy?: 1 - Never What is the last grade level you completed in school?: 82yrs college  Diabetic? Yes Nutrition Risk Assessment:  Has the patient had any N/V/D within the last 2 months?  No  Does  the patient have any non-healing wounds?  No  Has the patient had any unintentional weight loss or weight gain?  No   Diabetes:  Is the patient diabetic?  Yes  If diabetic, was a CBG obtained today?  No  Did the patient bring in their glucometer from home?  No  How often do you monitor your CBG's? daily.   Financial Strains and Diabetes Management:  Are you having any financial strains with the device, your supplies or your medication? No .  Does the patient want to be seen by Chronic Care Management for management of their diabetes?  No  Would the patient like to be referred to a Nutritionist or for Diabetic Management?  No   Diabetic Exams:  Diabetic  Eye Exam: Completed 11/2021 Diabetic Foot Exam: Overdue, Pt has been advised about the importance in completing this exam. Pt is scheduled for diabetic foot exam on next appointment.   Interpreter Needed?: No  Information entered by :: NAllen LPN   Activities of Daily Living    05/13/2022    2:22 PM  In your present state of health, do you have any difficulty performing the following activities:  Hearing? 0  Vision? 0  Difficulty concentrating or making decisions? 0  Walking or climbing stairs? 0  Dressing or bathing? 0  Doing errands, shopping? 0  Preparing Food and eating ? N  Using the Toilet? N  In the past six months, have you accidently leaked urine? N  Do you have problems with loss of bowel control? N  Managing your Medications? N  Managing your Finances? N  Housekeeping or managing your Housekeeping? N    Patient Care Team: Dorothyann PengSanders, Robyn, MD as PCP - General (Internal Medicine) Harlan StainsPearson, Vallie J, [facility] CenterRPH (Pharmacist)  Indicate any recent Medical Services you may have received from other than [facility] providers in the past year (date may be approximate).     Assessment:   This is a routine wellness examination for Merton Borderrthur.  Hearing/Vision screen Vision Screening - Comments:: Regular eye exams, [facility]  Dietary  issues and exercise activities discussed: Current Exercise Habits: Home exercise routine, Type of exercise: walking, Time (Minutes): 60, Frequency (Times/Week): 7, Weekly Exercise (Minutes/Week): 420   Goals Addressed             This Visit's Progress    Patient Stated       05/13/2022, wants to lose weight       Depression Screen    05/13/2022    2:22 PM 04/16/2021    2:17 PM 04/09/2020    2:30 PM 05/30/2019   10:45 AM 05/22/2019   12:56 PM 01/30/2019   11:20 AM 01/30/2019   11:03 AM  PHQ 2/9 Scores  PHQ - 2 Score 0 0 0 0 0 0 0  PHQ- 9 Score   2   0     Fall Risk    05/13/2022    2:21 PM 04/16/2021    2:16 PM 04/09/2020    2:30 PM 06/04/2019   12:12 PM 05/30/2019   10:45 AM  Fall Risk   Falls in the past year? 0 0 0 0 0  Number falls in past yr: 0      Injury with Fall? 0      Risk for fall due to : No Fall Risks;Medication side effect Medication side effect Medication side effect    Follow up Falls prevention discussed;Education provided;Falls evaluation completed Falls evaluation completed;Education provided;Falls prevention discussed       FALL RISK PREVENTION PERTAINING TO THE HOME:  Any stairs in or around the home? No  If so, are there any without handrails? N/a Home free of loose throw rugs in walkways, pet beds, electrical cords, etc? Yes  Adequate lighting in your home to reduce risk of falls?  yes  ASSISTIVE DEVICES UTILIZED TO PREVENT FALLS:  Life alert? No  Use of a cane, walker or w/c? No  Grab bars in the bathroom? Yes  Shower chair or bench in shower? No  Elevated toilet seat or a handicapped toilet? No   TIMED UP AND GO:  Was the test performed? Yes .  Length of time to ambulate 10 feet: 5 sec.   Gait steady and fast without use  of assistive device  Cognitive Function:        05/13/2022    2:22 PM 04/16/2021    2:17 PM 04/09/2020    2:32 PM 01/30/2019   11:23 AM  6CIT Screen  What Year? 0 points 0 points 0 points 0 points  What month? 0  points 0 points 0 points 0 points  What time? 0 points 0 points 0 points 0 points  Count back from 20 0 points 0 points 0 points 0 points  Months in reverse 0 points 0 points 0 points 0 points  Repeat phrase 2 points 0 points 2 points 0 points  Total Score 2 points 0 points 2 points 0 points    Immunizations Immunization History  Administered Date(s) Administered   Janssen (J&J) SARS-COV-2 Vaccination 12/17/2019   Moderna Sars-Covid-2 Vaccination 07/28/2020, 01/30/2021   Pfizer Covid-19 Vaccine Bivalent Booster 76yrs & up 07/14/2021   Pneumococcal Conjugate-13 12/12/2015   Pneumococcal Polysaccharide-23 10/11/2014   Tdap 10/11/2014   Zoster Recombinat (Shingrix) 01/30/2021, 05/01/2021    TDAP status: Up to date  Flu Vaccine status: Due, Education has been provided regarding the importance of this vaccine. Advised may receive this vaccine at local pharmacy or Health Dept. Aware to provide a copy of the vaccination record if obtained from local pharmacy or Health Dept. Verbalized acceptance and understanding.  Pneumococcal vaccine status: Declined,  Education has been provided regarding the importance of this vaccine but patient still declined. Advised may receive this vaccine at local pharmacy or Health Dept. Aware to provide a copy of the vaccination record if obtained from local pharmacy or Health Dept. Verbalized acceptance and understanding.   Covid-19 vaccine status: Completed vaccines  Qualifies for Shingles Vaccine? Yes   Zostavax completed Yes   Shingrix Completed?: Yes  Screening Tests Health Maintenance  Topic Date Due   FOOT EXAM  09/02/2020   OPHTHALMOLOGY EXAM  06/25/2021   COVID-19 Vaccine (5 - Janssen risk series) 09/08/2021   Fecal DNA (Cologuard)  03/08/2022   HEMOGLOBIN A1C  03/11/2022   INFLUENZA VACCINE  04/13/2022   Pneumonia Vaccine 4+ Years old (3 - PPSV23 or PCV20) 05/14/2023 (Originally 10/12/2019)   TETANUS/TDAP  02/18/2026   Hepatitis C Screening   Completed   Zoster Vaccines- Shingrix  Completed   HPV VACCINES  Aged Out   COLONOSCOPY (Pts 45-88yrs Insurance coverage will need to be confirmed)  Discontinued    Health Maintenance  Health Maintenance Due  Topic Date Due   FOOT EXAM  09/02/2020   OPHTHALMOLOGY EXAM  06/25/2021   COVID-19 Vaccine (5 - Janssen risk series) 09/08/2021   Fecal DNA (Cologuard)  03/08/2022   HEMOGLOBIN A1C  03/11/2022   INFLUENZA VACCINE  04/13/2022    Colorectal cancer screening: cologuard ordered today  Lung Cancer Screening: (Low Dose CT Chest recommended if Age 44-80 years, 30 pack-year currently smoking OR have quit w/in 15years.) does not qualify.   Lung Cancer Screening Referral: no  Additional Screening:  Hepatitis C Screening: does qualify; Completed 11/17/2015  Vision Screening: Recommended annual ophthalmology exams for early detection of glaucoma and other disorders of the eye. Is the patient up to date with their annual eye exam?  Yes  Who is the provider or what is the name of the office in which the patient attends annual eye exams? [facility] If pt is not established with a provider, would they like to be referred to a provider to establish care? No .   Dental Screening: Recommended annual dental  exams for proper oral hygiene  Community Resource Referral / Chronic Care Management: CRR required this visit?  No   CCM required this visit?  No      Plan:     I have personally reviewed and noted the following in the patient's chart:   Medical and social history Use of alcohol, tobacco or illicit drugs  Current medications and supplements including opioid prescriptions. Patient is not currently taking opioid prescriptions. Functional ability and status Nutritional status Physical activity Advanced directives List of other physicians Hospitalizations, surgeries, and ER visits in previous 12 months Vitals Screenings to include cognitive, depression, and falls Referrals and  appointments  In addition, I have reviewed and discussed with patient certain preventive protocols, quality metrics, and best practice recommendations. A written personalized care plan for preventive services as well as general preventive health recommendations were provided to patient.     Barb Merino, LPN   8/33/8250   Nurse Notes: none

## 2022-05-13 NOTE — Progress Notes (Signed)
Subjective:     Patient ID: Marcus Davis , male    DOB: 19-Jun-1951 , 71 y.o.   MRN: [id number]   Chief Complaint  Patient presents with   Diabetes   Hypertension    HPI  Patient here for a f/u on his HTN and DM. He is also followed by the [place]. He was last seen by me in August 2022.  He reports compliance with meds. He denies headaches, chest pain and shortness of breath. He has no specific concerns at this time. He checks his BS at home. It averages between 96-120.    He has up to date with eye exam.    He was also seen by [facility] Advisor for his AWV.   Diabetes He presents for his follow-up diabetic visit. He has type 2 diabetes mellitus. There are no hypoglycemic associated symptoms. There are no diabetic associated symptoms. Pertinent negatives for diabetes include no blurred vision and no chest pain. There are no hypoglycemic complications. Risk factors for coronary artery disease include diabetes mellitus, dyslipidemia, hypertension, male sex and sedentary lifestyle. He is compliant with treatment most of the time. He is following a diabetic diet. He participates in exercise intermittently. Eye exam is current.  Hypertension This is a chronic problem. The current episode started more than 1 year ago. The problem has been gradually improving since onset. The problem is controlled. Pertinent negatives include no blurred vision, chest pain or palpitations. The current treatment provides moderate improvement.     Past Medical History:  Diagnosis Date   Allergy    DM (diabetes mellitus) (HCC)    HTN (hypertension)    Hyperlipidemia      Family History  Problem Relation Age of Onset   Diabetes Mother    Hypertension Mother    Stroke Mother    Hypertension Father    Diabetes Father    Stroke Paternal Grandfather      Current Outpatient Medications:    Alcohol Swabs (ALCOHOL WIPES) 70 % PADS, USE PAD TO AFFECTED AREA DAILY, Disp: , Rfl:    amLODipine (NORVASC) 10 MG tablet,  TAKE ONE TABLET BY MOUTH DAILY FOR HEART, Disp: , Rfl:    aspirin EC 81 MG tablet, Take 81 mg by mouth daily., Disp: , Rfl:    atorvastatin (LIPITOR) 40 MG tablet, TAKE ONE-HALF TABLET BY MOUTH AT BEDTIME FOR CHOLESTEROL *REPLACES ROSUVASTATIN (CRESTOR)*, Disp: , Rfl:    Cyanocobalamin (VITAMIN B-12) 2500 MCG SUBL, Place 1 tablet under the tongue daily. Taking Monday through Friday, Disp: , Rfl:    cyanocobalamin 100 MCG tablet, Take by mouth., Disp: , Rfl:    diphenhydrAMINE (BENADRYL) 25 mg capsule, TAKE ONE CAPSULE BY MOUTH AT BEDTIME FOR NIGHTTIME CONGESTION * MAY CAUSE DROWSINESS *, Disp: , Rfl:    hydrochlorothiazide (HYDRODIURIL) 25 MG tablet, Take 25 mg by mouth daily. , Disp: , Rfl:    insulin glargine (LANTUS) 100 UNIT/ML injection, Inject 36 Units into the skin., Disp: , Rfl:    JANUMET 50-1000 MG tablet, TAKE 1 TABLET BY MOUTH TWICE DAILY WITH A MEAL, Disp: 180 tablet, Rfl: 0   lisinopril (PRINIVIL,ZESTRIL) 40 MG tablet, Take 40 mg by mouth daily. , Disp: , Rfl:    metFORMIN (GLUCOPHAGE) 500 MG tablet, TAKE TWO TABLETS BY MOUTH BEFORE BREAKFAST AND TAKE ONE TABLET BEFORE SUPPER FOR DIABETES (ANNUAL KIDNEY FUNCTION TESTING IS NEEDED), Disp: , Rfl:    neomycin-polymyxin-hydrocortisone (CORTISPORIN) OTIC solution, Apply 1-2 drops to toenails at bedtime once daily,  Disp: 10 mL, Rfl: 3   Vitamin D, Cholecalciferol, 25 MCG (1000 UT) CAPS, Take by mouth. 1000 units Monday-Friday, 2000 units Saturday and Sunday, Disp: , Rfl:    Allergies  Allergen Reactions   Pollen Extract Other (See Comments)    Sneezing, runny nose, congestion,      Review of Systems  Constitutional: Negative.   Eyes:  Negative for blurred vision.  Respiratory: Negative.    Cardiovascular: Negative.  Negative for chest pain and palpitations.  Gastrointestinal: Negative.   Neurological: Negative.   Psychiatric/Behavioral: Negative.       Today's Vitals   05/13/22 1440  BP: 122/60  Temp: 98.1 F (36.7 C)   SpO2: 98%  Weight: 216 lb (98 kg)  Height: $Remove'5\' 9"'cAUPgIG$  (1.753 m)  PainSc: 0-No pain   Body mass index is 31.9 kg/m.  Wt Readings from Last 3 Encounters:  05/13/22 216 lb (98 kg)  05/13/22 216 lb 9.6 oz (98.2 kg)  09/10/21 214 lb (97.1 kg)    Objective:  Physical Exam Vitals and nursing note reviewed.  Constitutional:      Appearance: Normal appearance.  HENT:     Head: Normocephalic and atraumatic.  Eyes:     Extraocular Movements: Extraocular movements intact.  Cardiovascular:     Rate and Rhythm: Normal rate and regular rhythm.     Heart sounds: Normal heart sounds.  Pulmonary:     Effort: Pulmonary effort is normal.     Breath sounds: Normal breath sounds.  Musculoskeletal:     Cervical back: Normal range of motion.  Skin:    General: Skin is warm.  Neurological:     General: No focal deficit present.     Mental Status: He is alert.  Psychiatric:        Mood and Affect: Mood normal.         Assessment And Plan:     1. Type 2 diabetes mellitus with diabetic mononeuropathy, without long-term current use of insulin (HCC) Comments: Chronic, importance of dietary/medication/office visit compliance was stressed to the patient. I will check labs as below.  - CMP14+EGFR - CBC - Lipid panel - Hemoglobin A1c  2. Essential hypertension Comments: Chronic, well controlled.  He will c/w lisinopril $RemoveBefore'40mg'jIHvkieRHlmgc$ , amlodipine $RemoveBefo'10mg'FNBsKBOwXWj$  and hctz $Remove'25mg'HEpMRRH$  daily. He is encouraged to follow low sodium diet.  - CMP14+EGFR  3. Pure hypercholesterolemia Comments: Chronic, LDL goal <70. He will c/w atorvastatin $RemoveBeforeDE'40mg'WrqFkQuCZqkGcvj$  daily. He is encouraged to follow heart healthy lifestyle.  - CMP14+EGFR - Lipid panel  4. Class 1 obesity due to excess calories with serious comorbidity and body mass index (BMI) of 31.0 to 31.9 in adult Comments: He is encouraged to aim for at least 150 minutes of exercise per week, while striving for BMI<29 to decrease cardiac risk.   5. Screen for colon cancer Comments: Cologuard  ordered by [place]. He is encouraged to complete by mid-September.    Patient was given opportunity to ask questions. Patient verbalized understanding of the plan and was able to repeat key elements of the plan. All questions were answered to their satisfaction.   I, [name], MD, have reviewed all documentation for this visit. The documentation on 05/17/22 for the exam, diagnosis, procedures, and orders are all accurate and complete.   IF YOU HAVE BEEN REFERRED TO A SPECIALIST, IT MAY TAKE 1-2 WEEKS TO SCHEDULE/PROCESS THE REFERRAL. IF YOU HAVE NOT HEARD FROM US/SPECIALIST IN TWO WEEKS, PLEASE GIVE [place] A CALL AT [phone].  THE PATIENT IS ENCOURAGED TO PRACTICE SOCIAL DISTANCING DUE TO THE COVID-19 PANDEMIC.   

## 2022-05-13 NOTE — Addendum Note (Signed)
Addended by: Barb Merino on: 05/13/2022 03:05 PM   Modules accepted: Orders

## 2022-05-13 NOTE — Patient Instructions (Signed)
[name] , Thank you for taking time to come for your Medicare Wellness Visit. I appreciate your ongoing commitment to your health goals. Please review the following plan we discussed and let me know if I can assist you in the future.   Screening recommendations/referrals: Colonoscopy: cologuard ordered today Recommended yearly ophthalmology/optometry visit for glaucoma screening and checkup Recommended yearly dental visit for hygiene and checkup  Vaccinations: Influenza vaccine: due Pneumococcal vaccine: decline Tdap vaccine: completed 02/19/2016, due 02/18/2026 Shingles vaccine: completed   Covid-19:  07/14/2021, 01/30/2021, 07/28/2020, 12/17/2019  Advanced directives: Advance directive discussed with you today. Even though you declined this today please call our office should you change your mind and we can give you the proper paperwork for you to fill out.  Conditions/risks identified: none  Next appointment: Follow up in one year for your annual wellness visit.   Preventive Care 10 Years and Older, Male Preventive care refers to lifestyle choices and visits with your health care provider that can promote health and wellness. What does preventive care include? A yearly physical exam. This is also called an annual well check. Dental exams once or twice a year. Routine eye exams. Ask your health care provider how often you should have your eyes checked. Personal lifestyle choices, including: Daily care of your teeth and gums. Regular physical activity. Eating a healthy diet. Avoiding tobacco and drug use. Limiting alcohol use. Practicing safe sex. Taking low doses of aspirin every day. Taking vitamin and mineral supplements as recommended by your health care provider. What happens during an annual well check? The services and screenings done by your health care provider during your annual well check will depend on your age, overall health, lifestyle risk factors, and family history of  disease. Counseling  Your health care provider may ask you questions about your: Alcohol use. Tobacco use. Drug use. Emotional well-being. Home and relationship well-being. Sexual activity. Eating habits. History of falls. Memory and ability to understand (cognition). Work and work Astronomer. Screening  You may have the following tests or measurements: Height, weight, and BMI. Blood pressure. Lipid and cholesterol levels. These may be checked every 5 years, or more frequently if you are over 57 years old. Skin check. Lung cancer screening. You may have this screening every year starting at age 38 if you have a 30-pack-year history of smoking and currently smoke or have quit within the past 15 years. Fecal occult blood test (FOBT) of the stool. You may have this test every year starting at age 38. Flexible sigmoidoscopy or colonoscopy. You may have a sigmoidoscopy every 5 years or a colonoscopy every 10 years starting at age 24. Prostate cancer screening. Recommendations will vary depending on your family history and other risks. Hepatitis C blood test. Hepatitis B blood test. Sexually transmitted disease (STD) testing. Diabetes screening. This is done by checking your blood sugar (glucose) after you have not eaten for a while (fasting). You may have this done every 1-3 years. Abdominal aortic aneurysm (AAA) screening. You may need this if you are a current or former smoker. Osteoporosis. You may be screened starting at age 81 if you are at high risk. Talk with your health care provider about your test results, treatment options, and if necessary, the need for more tests. Vaccines  Your health care provider may recommend certain vaccines, such as: Influenza vaccine. This is recommended every year. Tetanus, diphtheria, and acellular pertussis (Tdap, Td) vaccine. You may need a Td booster every 10 years. Zoster vaccine.  You may need this after age 21. Pneumococcal 13-valent  conjugate (PCV13) vaccine. One dose is recommended after age 8. Pneumococcal polysaccharide (PPSV23) vaccine. One dose is recommended after age 45. Talk to your health care provider about which screenings and vaccines you need and how often you need them. This information is not intended to replace advice given to you by your health care provider. Make sure you discuss any questions you have with your health care provider. Document Released: 09/26/2015 Document Revised: 05/19/2016 Document Reviewed: 07/01/2015 Elsevier Interactive Patient Education  2017 [employer] Prevention in the Home Falls can cause injuries. They can happen to people of all ages. There are many things you can do to make your home safe and to help prevent falls. What can I do on the outside of my home? Regularly fix the edges of walkways and driveways and fix any cracks. Remove anything that might make you trip as you walk through a door, such as a raised step or threshold. Trim any bushes or trees on the path to your home. Use bright outdoor lighting. Clear any walking paths of anything that might make someone trip, such as rocks or tools. Regularly check to see if handrails are loose or broken. Make sure that both sides of any steps have handrails. Any raised decks and porches should have guardrails on the edges. Have any leaves, snow, or ice cleared regularly. Use sand or salt on walking paths during winter. Clean up any spills in your garage right away. This includes oil or grease spills. What can I do in the bathroom? Use night lights. Install grab bars by the toilet and in the tub and shower. Do not use towel bars as grab bars. Use non-skid mats or decals in the tub or shower. If you need to sit down in the shower, use a plastic, non-slip stool. Keep the floor dry. Clean up any water that spills on the floor as soon as it happens. Remove soap buildup in the tub or shower regularly. Attach bath mats  securely with double-sided non-slip rug tape. Do not have throw rugs and other things on the floor that can make you trip. What can I do in the bedroom? Use night lights. Make sure that you have a light by your bed that is easy to reach. Do not use any sheets or blankets that are too big for your bed. They should not hang down onto the floor. Have a firm chair that has side arms. You can use this for support while you get dressed. Do not have throw rugs and other things on the floor that can make you trip. What can I do in the kitchen? Clean up any spills right away. Avoid walking on wet floors. Keep items that you use a lot in easy-to-reach places. If you need to reach something above you, use a strong step stool that has a grab bar. Keep electrical cords out of the way. Do not use floor polish or wax that makes floors slippery. If you must use wax, use non-skid floor wax. Do not have throw rugs and other things on the floor that can make you trip. What can I do with my stairs? Do not leave any items on the stairs. Make sure that there are handrails on both sides of the stairs and use them. Fix handrails that are broken or loose. Make sure that handrails are as long as the stairways. Check any carpeting to make sure that it is  firmly attached to the stairs. Fix any carpet that is loose or worn. Avoid having throw rugs at the top or bottom of the stairs. If you do have throw rugs, attach them to the floor with carpet tape. Make sure that you have a light switch at the top of the stairs and the bottom of the stairs. If you do not have them, ask someone to add them for you. What else can I do to help prevent falls? Wear shoes that: Do not have high heels. Have rubber bottoms. Are comfortable and fit you well. Are closed at the toe. Do not wear sandals. If you use a stepladder: Make sure that it is fully opened. Do not climb a closed stepladder. Make sure that both sides of the stepladder  are locked into place. Ask someone to hold it for you, if possible. Clearly mark and make sure that you can see: Any grab bars or handrails. First and last steps. Where the edge of each step is. Use tools that help you move around (mobility aids) if they are needed. These include: Canes. Walkers. Scooters. Crutches. Turn on the lights when you go into a dark area. Replace any light bulbs as soon as they burn out. Set up your furniture so you have a clear path. Avoid moving your furniture around. If any of your floors are uneven, fix them. If there are any pets around you, be aware of where they are. Review your medicines with your doctor. Some medicines can make you feel dizzy. This can increase your chance of falling. Ask your doctor what other things that you can do to help prevent falls. This information is not intended to replace advice given to you by your health care provider. Make sure you discuss any questions you have with your health care provider. Document Released: 06/26/2009 Document Revised: 02/05/2016 Document Reviewed: 10/04/2014 Elsevier Interactive Patient Education  2017 [employer].

## 2022-05-13 NOTE — Patient Instructions (Signed)

## 2022-05-14 LAB — MICROALBUMIN / CREATININE URINE RATIO
Creatinine, Urine: 141.3 mg/dL
Microalb/Creat Ratio: 30 mg/g creat — ABNORMAL HIGH (ref 0–29)
Microalbumin, Urine: 42.9 ug/mL

## 2022-05-14 LAB — CMP14+EGFR
ALT: 23 IU/L (ref 0–44)
AST: 19 IU/L (ref 0–40)
Albumin/Globulin Ratio: 1.6 (ref 1.2–2.2)
Albumin: 4.4 g/dL (ref 3.9–4.9)
Alkaline Phosphatase: 69 IU/L (ref 44–121)
BUN/Creatinine Ratio: 8 — ABNORMAL LOW (ref 10–24)
BUN: 9 mg/dL (ref 8–27)
Bilirubin Total: 0.4 mg/dL (ref 0.0–1.2)
CO2: 24 mmol/L (ref 20–29)
Calcium: 9.7 mg/dL (ref 8.6–10.2)
Chloride: 101 mmol/L (ref 96–106)
Creatinine, Ser: 1.17 mg/dL (ref 0.76–1.27)
Globulin, Total: 2.8 g/dL (ref 1.5–4.5)
Glucose: 203 mg/dL — ABNORMAL HIGH (ref 70–99)
Potassium: 4.6 mmol/L (ref 3.5–5.2)
Sodium: 138 mmol/L (ref 134–144)
Total Protein: 7.2 g/dL (ref 6.0–8.5)
eGFR: 67 mL/min/{1.73_m2} (ref >59)

## 2022-05-14 LAB — LIPID PANEL
Chol/HDL Ratio: 3.8 ratio (ref 0.0–5.0)
Cholesterol, Total: 126 mg/dL (ref 100–199)
HDL: 33 mg/dL — ABNORMAL LOW (ref >39)
LDL Chol Calc (NIH): 58 mg/dL (ref 0–99)
Triglycerides: 212 mg/dL — ABNORMAL HIGH (ref 0–149)
VLDL Cholesterol Cal: 35 mg/dL (ref 5–40)

## 2022-05-14 LAB — CBC
Hematocrit: 35.1 % — ABNORMAL LOW (ref 37.5–51.0)
Hemoglobin: 12.2 g/dL — ABNORMAL LOW (ref 13.0–17.7)
MCH: 29.9 pg (ref 26.6–33.0)
MCHC: 34.8 g/dL (ref 31.5–35.7)
MCV: 86 fL (ref 79–97)
Platelets: 276 10*3/uL (ref 150–450)
RBC: 4.08 x10E6/uL — ABNORMAL LOW (ref 4.14–5.80)
RDW: 12.6 % (ref 11.6–15.4)
WBC: 5.4 10*3/uL (ref 3.4–10.8)

## 2022-05-14 LAB — HEMOGLOBIN A1C
Est. average glucose Bld gHb Est-mCnc: 163 mg/dL
Hgb A1c MFr Bld: 7.3 % — ABNORMAL HIGH (ref 4.8–5.6)

## 2022-05-25 DIAGNOSIS — Z1211 Encounter for screening for malignant neoplasm of colon: Secondary | ICD-10-CM | POA: Diagnosis not present

## 2022-06-01 LAB — COLOGUARD: COLOGUARD: NEGATIVE

## 2022-06-07 ENCOUNTER — Telehealth: Payer: Self-pay

## 2022-06-07 NOTE — Chronic Care Management (AMB) (Signed)
Care Gap(s) Not Met that Need to be Addressed:   Statin Therapy for Patients With Diabetes   Action Taken: Patient is taking Atorvastatin 40 mg half tablet at bedtime. Attempted to contact patient to confirm last [facility] date [facility] [place]. Contact patient again. 06-08-2022 2nd attempt left VM. 3rd attempt 06-09-2022   Follow Up: Contact patient again on 07-12-2022  [place] Pharmacist Assistant [phone]

## 2022-06-09 ENCOUNTER — Other Ambulatory Visit: Payer: Self-pay | Admitting: Internal Medicine

## 2022-06-09 ENCOUNTER — Other Ambulatory Visit: Payer: Self-pay

## 2022-06-09 MED ORDER — JANUMET 50-1000 MG PO TABS: 1.0000 | ORAL_TABLET | ORAL | 0 refills | Status: DC

## 2022-06-29 ENCOUNTER — Ambulatory Visit (INDEPENDENT_AMBULATORY_CARE_PROVIDER_SITE_OTHER): Payer: Medicare HMO | Admitting: Internal Medicine

## 2022-06-29 ENCOUNTER — Encounter: Payer: Self-pay | Admitting: Internal Medicine

## 2022-06-29 VITALS — BP 136/80 | HR 84 | Temp 98.1°F | Ht 68.8 in | Wt 209.8 lb

## 2022-06-29 DIAGNOSIS — E6609 Other obesity due to excess calories: Secondary | ICD-10-CM

## 2022-06-29 DIAGNOSIS — E1141 Type 2 diabetes mellitus with diabetic mononeuropathy: Secondary | ICD-10-CM | POA: Diagnosis not present

## 2022-06-29 DIAGNOSIS — Z6831 Body mass index (BMI) 31.0-31.9, adult: Secondary | ICD-10-CM | POA: Diagnosis not present

## 2022-06-29 DIAGNOSIS — Z2821 Immunization not carried out because of patient refusal: Secondary | ICD-10-CM | POA: Diagnosis not present

## 2022-06-29 DIAGNOSIS — E66811 Obesity, class 1: Secondary | ICD-10-CM

## 2022-06-29 DIAGNOSIS — I10 Essential (primary) hypertension: Secondary | ICD-10-CM | POA: Diagnosis not present

## 2022-06-29 MED ORDER — EMPAGLIFLOZIN 25 MG PO TABS: 25.0000 mg | ORAL_TABLET | Freq: Every day | ORAL | 0 refills | Status: DC

## 2022-06-29 NOTE — Patient Instructions (Signed)

## 2022-06-29 NOTE — Progress Notes (Signed)
Marcus Davis,acting as a Education administrator for Marcus Greenland, MD.,have documented all relevant documentation on the behalf of Marcus Greenland, MD,as directed by  Marcus Greenland, MD while in the presence of Marcus Greenland, MD.    Subjective:     Patient ID: Marcus Davis , male    DOB: 06/15/1951 , 71 y.o.   MRN: [id number]   Chief Complaint  Patient presents with   Diabetes   Hypertension    HPI  Patient presents today for a diabetes check. He was started on Farxiga at his last visit. He continued with Janumet 50/$RemoveBeforeD'1000mg'oHlgZnRjOQTRtN$  twice daily; however, he stopped his mid-day metformin that was started by the [place]. He reports his numbers have been running 20-30 points higher since starting the [place] and stopping his mid-day metformin.   He has no other concerns at this time.   Diabetes He presents for his follow-up diabetic visit. He has type 2 diabetes mellitus. There are no hypoglycemic associated symptoms. There are no diabetic associated symptoms. Pertinent negatives for diabetes include no blurred vision, no chest pain, no polydipsia, no polyphagia and no polyuria. There are no hypoglycemic complications. Risk factors for coronary artery disease include diabetes mellitus, dyslipidemia, hypertension, male sex and sedentary lifestyle. He is compliant with treatment most of the time. He is following a diabetic diet. He participates in exercise intermittently. Eye exam is current.  Hypertension This is a chronic problem. The current episode started more than 1 year ago. The problem has been gradually improving since onset. The problem is controlled. Pertinent negatives include no blurred vision, chest pain or palpitations. The current treatment provides moderate improvement.     Past Medical History:  Diagnosis Date   Allergy    DM (diabetes mellitus) ([place])    HTN (hypertension)    Hyperlipidemia      Family History  Problem Relation Age of Onset   Diabetes Mother    Hypertension Mother     Stroke Mother    Hypertension Father    Diabetes Father    Stroke Paternal Grandfather      Current Outpatient Medications:    Alcohol Swabs (ALCOHOL WIPES) 70 % PADS, USE PAD TO AFFECTED AREA DAILY, Disp: , Rfl:    amLODipine (NORVASC) 10 MG tablet, TAKE ONE TABLET BY MOUTH DAILY FOR HEART, Disp: , Rfl:    aspirin EC 81 MG tablet, Take 81 mg by mouth daily., Disp: , Rfl:    atorvastatin (LIPITOR) 40 MG tablet, TAKE ONE-HALF TABLET BY MOUTH AT BEDTIME FOR CHOLESTEROL *REPLACES ROSUVASTATIN (CRESTOR)*, Disp: , Rfl:    cyanocobalamin 100 MCG tablet, Take by mouth., Disp: , Rfl:    diphenhydrAMINE (BENADRYL) 25 mg capsule, TAKE ONE CAPSULE BY MOUTH AT BEDTIME FOR NIGHTTIME CONGESTION * MAY CAUSE DROWSINESS *, Disp: , Rfl:    empagliflozin (JARDIANCE) 25 MG TABS tablet, Take 1 tablet (25 mg total) by mouth daily before breakfast., Disp: 30 tablet, Rfl: 0   hydrochlorothiazide (HYDRODIURIL) 25 MG tablet, Take 25 mg by mouth daily. , Disp: , Rfl:    insulin glargine (LANTUS) 100 UNIT/ML injection, Inject 36 Units into the skin., Disp: , Rfl:    lisinopril (PRINIVIL,ZESTRIL) 40 MG tablet, Take 40 mg by mouth daily. , Disp: , Rfl:    sitaGLIPtin-metformin (JANUMET) 50-1000 MG tablet, Take 1 tablet by mouth as directed., Disp: 180 tablet, Rfl: 0   Vitamin D, Cholecalciferol, 25 MCG (1000 UT) CAPS, Take by mouth. 1000 units Monday-Friday, 2000 units Saturday and Sunday,  Disp: , Rfl:    metFORMIN (GLUCOPHAGE) 500 MG tablet, TAKE TWO TABLETS BY MOUTH BEFORE BREAKFAST AND TAKE ONE TABLET BEFORE SUPPER FOR DIABETES (ANNUAL KIDNEY FUNCTION TESTING IS NEEDED) (Patient not taking: Reported on 06/29/2022), Disp: , Rfl:    Allergies  Allergen Reactions   Pollen Extract Other (See Comments)    Sneezing, runny nose, congestion,      Review of Systems  Constitutional: Negative.   Eyes: Negative.  Negative for blurred vision.  Respiratory: Negative.    Cardiovascular: Negative.  Negative for chest pain and  palpitations.  Gastrointestinal: Negative.   Endocrine: Negative for polydipsia, polyphagia and polyuria.  Musculoskeletal: Negative.   Skin: Negative.   Neurological: Negative.   Psychiatric/Behavioral: Negative.       Today's Vitals   06/29/22 1031  BP: 136/80  Pulse: 84  Temp: 98.1 F (36.7 C)  Weight: 209 lb 12.8 oz (95.2 kg)  Height: 5' 8.8" (1.748 m)  PainSc: 0-No pain   Body mass index is 31.16 kg/m.  Wt Readings from Last 3 Encounters:  06/29/22 209 lb 12.8 oz (95.2 kg)  05/13/22 216 lb (98 kg)  05/13/22 216 lb 9.6 oz (98.2 kg)     Objective:  Physical Exam Vitals and nursing note reviewed.  Constitutional:      Appearance: Normal appearance.  HENT:     Head: Normocephalic and atraumatic.     Nose:     Comments: MASKED     Mouth/Throat:     Comments: MASKED  Eyes:     Extraocular Movements: Extraocular movements intact.  Cardiovascular:     Rate and Rhythm: Normal rate and regular rhythm.     Heart sounds: Normal heart sounds.  Pulmonary:     Effort: Pulmonary effort is normal.     Breath sounds: Normal breath sounds.  Musculoskeletal:     Cervical back: Normal range of motion.  Skin:    General: Skin is warm.  Neurological:     General: No focal deficit present.     Mental Status: He is alert.  Psychiatric:        Mood and Affect: Mood normal.      Assessment And Plan:     1. Type 2 diabetes mellitus with diabetic mononeuropathy, without long-term current use of insulin ([place]) Comments: I will check BMP today. However, his insurance prefers [place]. He was given samples of $RemoveBe'25mg'EEmhPNhWd$  to take once daily, f/u 6 weeks.  - BMP8+EGFR - AMB Referral to Chronic Care Management Services  2. Essential hypertension Comments: Chronic, fair control. Goal BP<130/80, he will c/w lisinopril $RemoveBefore'40mg'wvancKQbObQVH$ , amlodipine $RemoveBefo'10mg'avlKCNTJMyj$  and hctz $Remove'25mg'YHHuCjA$  daily for now. He would likely benefit from ARB therapy.   3. Class 1 obesity due to excess calories with serious comorbidity and body  mass index (BMI) of 31.0 to 31.9 in adult Comments: He is encouraged to aim for at least 150 minutes of exercise per week, while striving for BMI<29 to decrease cardiac risk.   4. Influenza vaccination declined   Patient was given opportunity to ask questions. Patient verbalized understanding of the plan and was able to repeat key elements of the plan. All questions were answered to their satisfaction.   I, Marcus Greenland, MD, have reviewed all documentation for this visit. The documentation on 06/29/22 for the exam, diagnosis, procedures, and orders are all accurate and complete.   IF YOU HAVE BEEN REFERRED TO A SPECIALIST, IT MAY TAKE 1-2 WEEKS TO SCHEDULE/PROCESS THE REFERRAL. IF YOU HAVE NOT HEARD  FROM US/SPECIALIST IN TWO WEEKS, PLEASE GIVE [place] A CALL AT [phone].   THE PATIENT IS ENCOURAGED TO PRACTICE SOCIAL DISTANCING DUE TO THE COVID-19 PANDEMIC.

## 2022-06-30 ENCOUNTER — Telehealth: Payer: Self-pay

## 2022-06-30 LAB — BMP8+EGFR
BUN/Creatinine Ratio: 11 (ref 10–24)
BUN: 14 mg/dL (ref 8–27)
CO2: 23 mmol/L (ref 20–29)
Calcium: 10 mg/dL (ref 8.6–10.2)
Chloride: 99 mmol/L (ref 96–106)
Creatinine, Ser: 1.32 mg/dL — ABNORMAL HIGH (ref 0.76–1.27)
Glucose: 142 mg/dL — ABNORMAL HIGH (ref 70–99)
Potassium: 4.8 mmol/L (ref 3.5–5.2)
Sodium: 140 mmol/L (ref 134–144)
eGFR: 58 mL/min/{1.73_m2} — ABNORMAL LOW

## 2022-06-30 NOTE — Chronic Care Management (AMB) (Signed)
Patient assistance applications for Janumet with [employer] patient assistance program and for [place] with [employer] cares was filled out by patient, receiving signatures from PCP, awaiting income documentation from patient.   Pattricia Boss, [place] Pharmacist Assistant [phone]

## 2022-07-19 ENCOUNTER — Telehealth: Payer: Self-pay

## 2022-07-19 NOTE — Progress Notes (Signed)
07-19-2022: Left patient a VM requesting income verification for patient assistance application.  07-27-2022: Left patient a VM requesting income verification for patient assistance application.  07-28-2022: Left patient a VM requesting income verification for patient assistance application.  [place] Pharmacist Assistant [phone]

## 2022-07-23 ENCOUNTER — Telehealth: Payer: Self-pay

## 2022-07-23 NOTE — Progress Notes (Signed)
  Chronic Care Management   Note  07/23/2022 Name: Marcus Davis MRN: [id number] DOB: [birth year]  Marcus Davis is a 71 y.o. year old male who is a primary care patient of Marcus Peng, MD. I reached out to Marcus Davis by phone today in response to a referral sent by Marcus Davis PCP.  Marcus Davis was not successfully contacted today. A HIPAA compliant voice message was left requesting a return call.   Follow up plan: Additional outreach attempts will be made.  Penne Lash, RMA Care Guide [facility] [place], [place] [address] Direct Dial: [phone] [email or web address]

## 2022-08-10 ENCOUNTER — Other Ambulatory Visit: Payer: Self-pay | Admitting: Internal Medicine

## 2022-08-10 ENCOUNTER — Ambulatory Visit (INDEPENDENT_AMBULATORY_CARE_PROVIDER_SITE_OTHER): Payer: Medicare HMO | Admitting: Internal Medicine

## 2022-08-10 ENCOUNTER — Encounter: Payer: Self-pay | Admitting: Internal Medicine

## 2022-08-10 VITALS — BP 140/60 | HR 98 | Temp 98.0°F | Ht 68.0 in | Wt 215.8 lb

## 2022-08-10 DIAGNOSIS — E6609 Other obesity due to excess calories: Secondary | ICD-10-CM | POA: Diagnosis not present

## 2022-08-10 DIAGNOSIS — N1831 Chronic kidney disease, stage 3a: Secondary | ICD-10-CM | POA: Diagnosis not present

## 2022-08-10 DIAGNOSIS — D649 Anemia, unspecified: Secondary | ICD-10-CM

## 2022-08-10 DIAGNOSIS — Z6832 Body mass index (BMI) 32.0-32.9, adult: Secondary | ICD-10-CM

## 2022-08-10 DIAGNOSIS — E538 Deficiency of other specified B group vitamins: Secondary | ICD-10-CM | POA: Diagnosis not present

## 2022-08-10 DIAGNOSIS — E1141 Type 2 diabetes mellitus with diabetic mononeuropathy: Secondary | ICD-10-CM | POA: Diagnosis not present

## 2022-08-10 DIAGNOSIS — I10 Essential (primary) hypertension: Secondary | ICD-10-CM | POA: Diagnosis not present

## 2022-08-10 MED ORDER — EMPAGLIFLOZIN 25 MG PO TABS: 25.0000 mg | ORAL_TABLET | Freq: Every day | ORAL | 2 refills | Status: DC

## 2022-08-10 NOTE — Patient Instructions (Signed)

## 2022-08-10 NOTE — Progress Notes (Signed)
Barnet Glasgow Martin,acting as a Education administrator for [name], MD.,have documented all relevant documentation on the behalf of [name], MD,as directed by  [name], MD while in the presence of [name], MD.    Subjective:     Patient ID: Marcus Davis , male    DOB: [birth year] , 71 y.o.   MRN: [id number]   Chief Complaint  Patient presents with   Hypertension   Diabetes    HPI  Patient presents today for a blood pressure and diabetes check.  He reports compliance with meds. He denies headaches, chest pain and shortness of breath. Patient has no other concerns at this time. He is also followed at the [place].  BP Readings from Last 3 Encounters: 08/10/22 : (!) 140/60 06/29/22 : 136/80 05/13/22 : 122/60    Hypertension This is a chronic problem. The current episode started more than 1 year ago. The problem has been gradually improving since onset. The problem is controlled. Pertinent negatives include no blurred vision, chest pain or palpitations. Risk factors for coronary artery disease include diabetes mellitus, dyslipidemia, male gender and sedentary lifestyle. The current treatment provides moderate improvement.  Diabetes He presents for his follow-up diabetic visit. He has type 2 diabetes mellitus. There are no hypoglycemic associated symptoms. There are no diabetic associated symptoms. Pertinent negatives for diabetes include no blurred vision and no chest pain. There are no hypoglycemic complications. Risk factors for coronary artery disease include diabetes mellitus, dyslipidemia, hypertension, male sex and sedentary lifestyle. He is compliant with treatment most of the time. He is following a diabetic diet. He participates in exercise intermittently. Eye exam is current.     Past Medical History:  Diagnosis Date   Allergy    DM (diabetes mellitus) ([place])    HTN (hypertension)    Hyperlipidemia      Family History  Problem Relation Age of Onset   Diabetes Mother     Hypertension Mother    Stroke Mother    Hypertension Father    Diabetes Father    Stroke Paternal Grandfather      Current Outpatient Medications:    Alcohol Swabs (ALCOHOL WIPES) 70 % PADS, USE PAD TO AFFECTED AREA DAILY, Disp: , Rfl:    amLODipine (NORVASC) 10 MG tablet, TAKE ONE TABLET BY MOUTH DAILY FOR HEART, Disp: , Rfl:    aspirin EC 81 MG tablet, Take 81 mg by mouth daily., Disp: , Rfl:    atorvastatin (LIPITOR) 40 MG tablet, TAKE ONE-HALF TABLET BY MOUTH AT BEDTIME FOR CHOLESTEROL *REPLACES ROSUVASTATIN (CRESTOR)*, Disp: , Rfl:    cyanocobalamin 100 MCG tablet, Take by mouth., Disp: , Rfl:    diphenhydrAMINE (BENADRYL) 25 mg capsule, TAKE ONE CAPSULE BY MOUTH AT BEDTIME FOR NIGHTTIME CONGESTION * MAY CAUSE DROWSINESS *, Disp: , Rfl:    hydrochlorothiazide (HYDRODIURIL) 25 MG tablet, Take 25 mg by mouth daily. , Disp: , Rfl:    insulin glargine (LANTUS) 100 UNIT/ML injection, Inject 36 Units into the skin., Disp: , Rfl:    lisinopril (PRINIVIL,ZESTRIL) 40 MG tablet, Take 40 mg by mouth daily. , Disp: , Rfl:    sitaGLIPtin-metformin (JANUMET) 50-1000 MG tablet, Take 1 tablet by mouth as directed., Disp: 180 tablet, Rfl: 0   Vitamin D, Cholecalciferol, 25 MCG (1000 UT) CAPS, Take by mouth. 1000 units Monday-Friday, 2000 units Saturday and Sunday, Disp: , Rfl:    empagliflozin (JARDIANCE) 25 MG TABS tablet, Take 1 tablet (25 mg total) by mouth daily  before breakfast., Disp: 30 tablet, Rfl: 2   Allergies  Allergen Reactions   Pollen Extract Other (See Comments)    Sneezing, runny nose, congestion,      Review of Systems  Constitutional: Negative.   HENT: Negative.    Eyes: Negative.  Negative for blurred vision.  Respiratory: Negative.    Cardiovascular: Negative.  Negative for chest pain and palpitations.  Gastrointestinal: Negative.      Today's Vitals   08/10/22 1126  BP: (!) 140/60  Pulse: 98  Temp: 98 F (36.7 C)  TempSrc: Oral  Weight: 215 lb 12.8 oz (97.9  kg)  Height: _0  (1.727 m)  PainSc: 0-No pain   Body mass index is 32.81 kg/m.  Wt Readings from Last 3 Encounters:  08/10/22 215 lb 12.8 oz (97.9 kg)  06/29/22 209 lb 12.8 oz (95.2 kg)  05/13/22 216 lb (98 kg)    Objective:  Physical Exam Vitals and nursing note reviewed.  Constitutional:      Appearance: Normal appearance.  HENT:     Head: Normocephalic and atraumatic.     Nose:     Comments: Masked     Mouth/Throat:     Comments: Masked  Eyes:     Extraocular Movements: Extraocular movements intact.  Cardiovascular:     Rate and Rhythm: Normal rate and regular rhythm.     Heart sounds: Normal heart sounds.  Pulmonary:     Effort: Pulmonary effort is normal.     Breath sounds: Normal breath sounds.  Musculoskeletal:     Cervical back: Normal range of motion.  Skin:    General: Skin is warm.  Neurological:     General: No focal deficit present.     Mental Status: He is alert.  Psychiatric:        Mood and Affect: Mood normal.         Assessment And Plan:     1. Essential hypertension Comments: Uncontrolled, no change with repeat testing. He agrees to rto in 4 weeks for nurse visit,. Will consider changing lisinopril to ARB at that time. - BMP8+eGFR  2. Type 2 diabetes mellitus with diabetic mononeuropathy, without long-term current use of insulin (HCC) Comments: Chronic, I will check labs as below. He is now on SGLT2 inh w/o any issues. He will f/u in 3 months. - Hemoglobin A1c  3. Anemia, unspecified type Comments: I will check CBC vitamin B12 level and iron panel. I will make further recommendations once his labs are available for review. - Iron, TIBC and Ferritin Panel - Vitamin B12  4. Class 1 obesity due to excess calories with serious comorbidity and body mass index (BMI) of 32.0 to 32.9 in adult Comments: He is encouraged to aim for at least 150 minutes of exercise per week, while initialy striving for BMI<30 to decrease cardiac risk.      Patient was given opportunity to ask questions. Patient verbalized understanding of the plan and was able to repeat key elements of the plan. All questions were answered to their satisfaction.   I, [name], MD, have reviewed all documentation for this visit. The documentation on 08/10/22 for the exam, diagnosis, procedures, and orders are all accurate and complete.   IF YOU HAVE BEEN REFERRED TO A SPECIALIST, IT MAY TAKE 1-2 WEEKS TO SCHEDULE/PROCESS THE REFERRAL. IF YOU HAVE NOT HEARD FROM US/SPECIALIST IN TWO WEEKS, PLEASE GIVE [place] A CALL AT [phone].   THE PATIENT IS ENCOURAGED TO PRACTICE SOCIAL  DISTANCING DUE TO THE COVID-19 PANDEMIC.

## 2022-08-11 LAB — IRON,TIBC AND FERRITIN PANEL
Ferritin: 25 ng/mL — ABNORMAL LOW (ref 30–400)
Iron Saturation: 19 % (ref 15–55)
Iron: 62 ug/dL (ref 38–169)
Total Iron Binding Capacity: 327 ug/dL (ref 250–450)
UIBC: 265 ug/dL (ref 111–343)

## 2022-08-11 LAB — HEMOGLOBIN A1C
Est. average glucose Bld gHb Est-mCnc: 194 mg/dL
Hgb A1c MFr Bld: 8.4 % — ABNORMAL HIGH (ref 4.8–5.6)

## 2022-08-11 LAB — BMP8+EGFR
BUN/Creatinine Ratio: 12 (ref 10–24)
BUN: 17 mg/dL (ref 8–27)
CO2: 18 mmol/L — ABNORMAL LOW (ref 20–29)
Calcium: 10 mg/dL (ref 8.6–10.2)
Chloride: 99 mmol/L (ref 96–106)
Creatinine, Ser: 1.42 mg/dL — ABNORMAL HIGH (ref 0.76–1.27)
Glucose: 181 mg/dL — ABNORMAL HIGH (ref 70–99)
Potassium: 4.3 mmol/L (ref 3.5–5.2)
Sodium: 139 mmol/L (ref 134–144)
eGFR: 53 mL/min/{1.73_m2} — ABNORMAL LOW (ref >59)

## 2022-08-11 LAB — VITAMIN B12: Vitamin B-12: >2000 pg/mL — ABNORMAL HIGH (ref 232–1245)

## 2022-08-11 NOTE — Progress Notes (Signed)
  Chronic Care Management   Note  08/11/2022 Name: Abdikadir Fohl MRN: [id number] DOB: [birth year]  Marcus Davis is a 71 y.o. year old male who is a primary care patient of [name], MD. I reached out to [name] by phone today in response to a referral sent by [name] PCP.  Mr. Evian Salguero was not successfully contacted today. A HIPAA compliant voice message was left requesting a return call.   Follow up plan: Additional outreach attempts will be made.  Penne Lash, RMA Care Guide [facility]  [place], [place] [address] Direct Dial: [phone] [email or web address]

## 2022-08-17 ENCOUNTER — Telehealth: Payer: Self-pay

## 2022-08-17 NOTE — Telephone Encounter (Signed)
   CCM RN Visit Note   08/17/22 Name: Marcus Davis MRN: [id number]      DOB: 11/17/1950  Subjective: Marcus Davis is a 71 y.o. year old male who is a primary care patient of [name], MD. The patient was referred to the Chronic Care Management team for assistance with care management needs subsequent to provider initiation of CCM services and plan of care.      An unsuccessful telephone outreach was attempted today to contact the patient about Chronic Care Management needs.    PLAN A HIPAA compliant phone message was left for the patient providing contact information and requesting a return call.   Katha Cabal RN Care Manager/Chronic Care Management [phone]

## 2022-08-19 LAB — PE AND FLC, SERUM
A/G Ratio: 1.2 (ref 0.7–1.7)
Albumin ELP: 3.6 g/dL (ref 2.9–4.4)
Alpha 1: 0.2 g/dL (ref 0.0–0.4)
Alpha 2: 1 g/dL (ref 0.4–1.0)
Beta: 1 g/dL (ref 0.7–1.3)
Gamma Globulin: 0.9 g/dL (ref 0.4–1.8)
Globulin, Total: 3.1 g/dL (ref 2.2–3.9)
Ig Kappa Free Light Chain: 35.9 mg/L — ABNORMAL HIGH (ref 3.3–19.4)
Ig Lambda Free Light Chain: 23.4 mg/L (ref 5.7–26.3)
KAPPA/LAMBDA RATIO: 1.53 (ref 0.26–1.65)
Total Protein: 6.7 g/dL (ref 6.0–8.5)

## 2022-08-19 LAB — SPECIMEN STATUS REPORT

## 2022-08-24 NOTE — Progress Notes (Signed)
  Chronic Care Management   Note  08/24/2022 Name: Marcus Davis MRN: [id number] DOB: [birth year]  Marcus Davis is a 71 y.o. year old male who is a primary care patient of [name], MD. I reached out to [name] by phone today in response to a referral sent by [name] PCP.  [name] was not successfully contacted today. A HIPAA compliant voice message was left requesting a return call.   Follow up plan: Unable to reach patient after 3 attempts. No further outreach attempts will be made pending additional provider engagement, patient request, or new provider order.   Penne Lash, RMA Care Guide [facility]  [place], [place] [address] Direct Dial: [phone] [email or web address]

## 2022-08-31 ENCOUNTER — Ambulatory Visit: Payer: Medicare HMO

## 2022-08-31 VITALS — BP 120/60 | HR 85 | Temp 98.3°F

## 2022-08-31 DIAGNOSIS — N1831 Chronic kidney disease, stage 3a: Secondary | ICD-10-CM

## 2022-08-31 DIAGNOSIS — I10 Essential (primary) hypertension: Secondary | ICD-10-CM

## 2022-08-31 NOTE — Progress Notes (Signed)
Patient presents today for BPC.  Amlodipine 10mg   HCTZ 25mg  and lisinopril 40mg .   BP Readings from Last 3 Encounters:  08/31/22 120/60  08/10/22 (!) 140/60  06/29/22 136/80   Patient will continue with current medications. Patient will follow up with provider 10/26/22.

## 2022-08-31 NOTE — Addendum Note (Signed)
Addended by: Nelda Bucks on: 08/31/2022 10:50 AM   Modules accepted: Orders

## 2022-08-31 NOTE — Patient Instructions (Signed)
Hypertension, Adult High blood pressure (hypertension) is when the force of blood pumping through the arteries is too strong. The arteries are the blood vessels that carry blood from the heart throughout the body. Hypertension forces the heart to work harder to pump blood and may cause arteries to become narrow or stiff. Untreated or uncontrolled hypertension can lead to a heart attack, heart failure, a stroke, kidney disease, and other problems. A blood pressure reading consists of a higher number over a lower number. Ideally, your blood pressure should be below 120/80. The first ("top") number is called the systolic pressure. It is a measure of the pressure in your arteries as your heart beats. The second ("bottom") number is called the diastolic pressure. It is a measure of the pressure in your arteries as the heart relaxes. What are the causes? The exact cause of this condition is not known. There are some conditions that result in high blood pressure. What increases the risk? Certain factors may make you more likely to develop high blood pressure. Some of these risk factors are under your control, including: Smoking. Not getting enough exercise or physical activity. Being overweight. Having too much fat, sugar, calories, or salt (sodium) in your diet. Drinking too much alcohol. Other risk factors include: Having a personal history of heart disease, diabetes, high cholesterol, or kidney disease. Stress. Having a family history of high blood pressure and high cholesterol. Having obstructive sleep apnea. Age. The risk increases with age. What are the signs or symptoms? High blood pressure may not cause symptoms. Very high blood pressure (hypertensive crisis) may cause: Headache. Fast or irregular heartbeats (palpitations). Shortness of breath. Nosebleed. Nausea and vomiting. Vision changes. Severe chest pain, dizziness, and seizures. How is this diagnosed? This condition is diagnosed by  measuring your blood pressure while you are seated, with your arm resting on a flat surface, your legs uncrossed, and your feet flat on the floor. The cuff of the blood pressure monitor will be placed directly against the skin of your upper arm at the level of your heart. Blood pressure should be measured at least twice using the same arm. Certain conditions can cause a difference in blood pressure between your right and left arms. If you have a high blood pressure reading during one visit or you have normal blood pressure with other risk factors, you may be asked to: Return on a different day to have your blood pressure checked again. Monitor your blood pressure at home for 1 week or longer. If you are diagnosed with hypertension, you may have other blood or imaging tests to help your health care provider understand your overall risk for other conditions. How is this treated? This condition is treated by making healthy lifestyle changes, such as eating healthy foods, exercising more, and reducing your alcohol intake. You may be referred for counseling on a healthy diet and physical activity. Your health care provider may prescribe medicine if lifestyle changes are not enough to get your blood pressure under control and if: Your systolic blood pressure is above 130. Your diastolic blood pressure is above 80. Your personal target blood pressure may vary depending on your medical conditions, your age, and other factors. Follow these instructions at home: Eating and drinking  Eat a diet that is high in fiber and potassium, and low in sodium, added sugar, and fat. An example of this eating plan is called the DASH diet. DASH stands for Dietary Approaches to Stop Hypertension. To eat this way: Eat   plenty of fresh fruits and vegetables. Try to fill one half of your plate at each meal with fruits and vegetables. Eat whole grains, such as whole-wheat pasta, brown rice, or whole-grain bread. Fill about one  fourth of your plate with whole grains. Eat or drink low-fat dairy products, such as skim milk or low-fat yogurt. Avoid fatty cuts of meat, processed or cured meats, and poultry with skin. Fill about one fourth of your plate with lean proteins, such as fish, chicken without skin, beans, eggs, or tofu. Avoid pre-made and processed foods. These tend to be higher in sodium, added sugar, and fat. Reduce your daily sodium intake. Many people with hypertension should eat less than 1,500 mg of sodium a day. Do not drink alcohol if: Your health care provider tells you not to drink. You are pregnant, may be pregnant, or are planning to become pregnant. If you drink alcohol: Limit how much you have to: 0-1 drink a day for women. 0-2 drinks a day for men. Know how much alcohol is in your drink. In the [place] one drink equals one 12 oz bottle of beer (355 mL), one 5 oz glass of wine (148 mL), or one 1 oz glass of hard liquor (44 mL). Lifestyle  Work with your health care provider to maintain a healthy body weight or to lose weight. Ask what an ideal weight is for you. Get at least 30 minutes of exercise that causes your heart to beat faster (aerobic exercise) most days of the week. Activities may include walking, swimming, or biking. Include exercise to strengthen your muscles (resistance exercise), such as Pilates or lifting weights, as part of your weekly exercise routine. Try to do these types of exercises for 30 minutes at least 3 days a week. Do not use any products that contain nicotine or tobacco. These products include cigarettes, chewing tobacco, and vaping devices, such as e-cigarettes. If you need help quitting, ask your health care provider. Monitor your blood pressure at home as told by your health care provider. Keep all follow-up visits. This is important. Medicines Take over-the-counter and prescription medicines only as told by your health care provider. Follow directions carefully. Blood  pressure medicines must be taken as prescribed. Do not skip doses of blood pressure medicine. Doing this puts you at risk for problems and can make the medicine less effective. Ask your health care provider about side effects or reactions to medicines that you should watch for. Contact a health care provider if you: Think you are having a reaction to a medicine you are taking. Have headaches that keep coming back (recurring). Feel dizzy. Have swelling in your ankles. Have trouble with your vision. Get help right away if you: Develop a severe headache or confusion. Have unusual weakness or numbness. Feel faint. Have severe pain in your chest or abdomen. Vomit repeatedly. Have trouble breathing. These symptoms may be an emergency. Get help right away. Call 911. Do not wait to see if the symptoms will go away. Do not drive yourself to the hospital. Summary Hypertension is when the force of blood pumping through your arteries is too strong. If this condition is not controlled, it may put you at risk for serious complications. Your personal target blood pressure may vary depending on your medical conditions, your age, and other factors. For most people, a normal blood pressure is less than 120/80. Hypertension is treated with lifestyle changes, medicines, or a combination of both. Lifestyle changes include losing weight, eating a healthy,   low-sodium diet, exercising more, and limiting alcohol. This information is not intended to replace advice given to you by your health care provider. Make sure you discuss any questions you have with your health care provider. Document Revised: 07/07/2021 Document Reviewed: 07/07/2021 Elsevier Patient Education  2023 [employer]  

## 2022-09-09 ENCOUNTER — Other Ambulatory Visit: Payer: Self-pay | Admitting: Internal Medicine

## 2022-09-14 ENCOUNTER — Ambulatory Visit: Payer: Medicare HMO | Admitting: Internal Medicine

## 2022-09-16 ENCOUNTER — Other Ambulatory Visit: Payer: Self-pay

## 2022-09-16 DIAGNOSIS — E1141 Type 2 diabetes mellitus with diabetic mononeuropathy: Secondary | ICD-10-CM

## 2022-09-28 ENCOUNTER — Ambulatory Visit: Payer: Medicare HMO | Admitting: Internal Medicine

## 2022-10-04 ENCOUNTER — Other Ambulatory Visit: Payer: Self-pay

## 2022-10-04 MED ORDER — EMPAGLIFLOZIN 25 MG PO TABS: 25.0000 mg | ORAL_TABLET | Freq: Every day | ORAL | 2 refills | Status: DC

## 2022-10-26 ENCOUNTER — Encounter: Payer: Self-pay | Admitting: Internal Medicine

## 2022-10-26 ENCOUNTER — Ambulatory Visit (INDEPENDENT_AMBULATORY_CARE_PROVIDER_SITE_OTHER): Payer: Medicare HMO | Admitting: Internal Medicine

## 2022-10-26 VITALS — BP 140/66 | HR 83 | Temp 97.8°F | Ht 68.0 in | Wt 208.2 lb

## 2022-10-26 DIAGNOSIS — N1831 Chronic kidney disease, stage 3a: Secondary | ICD-10-CM

## 2022-10-26 DIAGNOSIS — Z794 Long term (current) use of insulin: Secondary | ICD-10-CM | POA: Diagnosis not present

## 2022-10-26 DIAGNOSIS — E6609 Other obesity due to excess calories: Secondary | ICD-10-CM

## 2022-10-26 DIAGNOSIS — I129 Hypertensive chronic kidney disease with stage 1 through stage 4 chronic kidney disease, or unspecified chronic kidney disease: Secondary | ICD-10-CM | POA: Diagnosis not present

## 2022-10-26 DIAGNOSIS — Z6831 Body mass index (BMI) 31.0-31.9, adult: Secondary | ICD-10-CM

## 2022-10-26 DIAGNOSIS — E7800 Pure hypercholesterolemia, unspecified: Secondary | ICD-10-CM | POA: Diagnosis not present

## 2022-10-26 DIAGNOSIS — E1122 Type 2 diabetes mellitus with diabetic chronic kidney disease: Secondary | ICD-10-CM | POA: Diagnosis not present

## 2022-10-26 NOTE — Progress Notes (Signed)
Barnet Glasgow Martin,acting as a Education administrator for [name], MD.,have documented all relevant documentation on the behalf of [name], MD,as directed by  [name], MD while in the presence of [name], MD.    Subjective:     Patient ID: Marcus Davis , male    DOB: January 10, 1951 , 72 y.o.   MRN: [id number]   Chief Complaint  Patient presents with   Diabetes   Hyperlipidemia   Hypertension    HPI  Patient presents today for a diabetes check. Patient states compliance with medications.  He was recently started on Jardiance.  He denies having headaches, chest pain and shortness of breath. He states his sugars are fluctuating. He states they range from 126-168.    BP Readings from Last 3 Encounters: 10/26/22 : (!) 140/70 08/31/22 : 120/60 08/10/22 : (!) 140/60    Diabetes He presents for his follow-up diabetic visit. He has type 2 diabetes mellitus. There are no hypoglycemic associated symptoms. There are no diabetic associated symptoms. Pertinent negatives for diabetes include no blurred vision, no chest pain, no polydipsia, no polyphagia and no polyuria. There are no hypoglycemic complications. Risk factors for coronary artery disease include diabetes mellitus, dyslipidemia, hypertension, male sex and sedentary lifestyle. He is compliant with treatment most of the time. He is following a diabetic diet. He participates in exercise intermittently. Eye exam is current.  Hyperlipidemia Pertinent negatives include no chest pain.  Hypertension This is a chronic problem. The current episode started more than 1 year ago. The problem has been gradually improving since onset. The problem is controlled. Pertinent negatives include no blurred vision, chest pain or palpitations. The current treatment provides moderate improvement.     Past Medical History:  Diagnosis Date   Allergy    DM (diabetes mellitus) ([place])    HTN (hypertension)    Hyperlipidemia      Family History   Problem Relation Age of Onset   Diabetes Mother    Hypertension Mother    Stroke Mother    Hypertension Father    Diabetes Father    Stroke Paternal Grandfather      Current Outpatient Medications:    Alcohol Swabs (ALCOHOL WIPES) 70 % PADS, USE PAD TO AFFECTED AREA DAILY, Disp: , Rfl:    amLODipine (NORVASC) 10 MG tablet, TAKE ONE TABLET BY MOUTH DAILY FOR HEART, Disp: , Rfl:    aspirin EC 81 MG tablet, Take 81 mg by mouth daily., Disp: , Rfl:    atorvastatin (LIPITOR) 40 MG tablet, TAKE ONE-HALF TABLET BY MOUTH AT BEDTIME FOR CHOLESTEROL *REPLACES ROSUVASTATIN (CRESTOR)*, Disp: , Rfl:    diphenhydrAMINE (BENADRYL) 25 mg capsule, TAKE ONE CAPSULE BY MOUTH AT BEDTIME FOR NIGHTTIME CONGESTION * MAY CAUSE DROWSINESS *, Disp: , Rfl:    empagliflozin (JARDIANCE) 25 MG TABS tablet, Take 1 tablet (25 mg total) by mouth daily before breakfast., Disp: 30 tablet, Rfl: 2   hydrochlorothiazide (HYDRODIURIL) 25 MG tablet, Take 25 mg by mouth daily. , Disp: , Rfl:    insulin glargine (LANTUS) 100 UNIT/ML injection, Inject 36 Units into the skin., Disp: , Rfl:    JANUMET 50-1000 MG tablet, TAKE 1 TABLET BY MOUTH TWICE DAILY WITH A MEAL, Disp: 180 tablet, Rfl: 0   lisinopril (PRINIVIL,ZESTRIL) 40 MG tablet, Take 40 mg by mouth daily. , Disp: , Rfl:    Vitamin D, Cholecalciferol, 25 MCG (1000 UT) CAPS, Take by mouth. 1000 units Monday-Friday, 2000 units Saturday and Sunday, Disp: ,  Rfl:    cyanocobalamin 100 MCG tablet, Take by mouth. (Patient not taking: Reported on 10/26/2022), Disp: , Rfl:    Allergies  Allergen Reactions   Pollen Extract Other (See Comments)    Sneezing, runny nose, congestion,      Review of Systems  Constitutional: Negative.   Eyes:  Negative for blurred vision.  Respiratory: Negative.    Cardiovascular: Negative.  Negative for chest pain and palpitations.  Gastrointestinal: Negative.   Endocrine: Negative for polydipsia, polyphagia and polyuria.  Neurological: Negative.    Psychiatric/Behavioral: Negative.       Today's Vitals   10/26/22 1202 10/26/22 1235  BP: (!) 140/70 (!) 140/66  Pulse: 83   Temp: 97.8 F (36.6 C)   TempSrc: Oral   Weight: 208 lb 3.2 oz (94.4 kg)   Height: 5\' 8"  (1.727 m)   PainSc: 0-No pain    Body mass index is 31.66 kg/m.  Wt Readings from Last 3 Encounters:  10/26/22 208 lb 3.2 oz (94.4 kg)  08/10/22 215 lb 12.8 oz (97.9 kg)  06/29/22 209 lb 12.8 oz (95.2 kg)    Objective:  Physical Exam Vitals and nursing note reviewed.  Constitutional:      Appearance: Normal appearance. He is obese.  HENT:     Head: Normocephalic and atraumatic.     Nose:     Comments: Masked     Mouth/Throat:     Comments: Masked  Eyes:     Extraocular Movements: Extraocular movements intact.  Cardiovascular:     Rate and Rhythm: Normal rate and regular rhythm.     Heart sounds: Normal heart sounds.  Pulmonary:     Effort: Pulmonary effort is normal.     Breath sounds: Normal breath sounds.  Musculoskeletal:     Cervical back: Normal range of motion.  Skin:    General: Skin is warm.  Neurological:     General: No focal deficit present.     Mental Status: He is alert.  Psychiatric:        Mood and Affect: Mood normal.       Assessment And Plan:     1. Type 2 diabetes mellitus with stage 3a chronic kidney disease, with long-term current use of insulin (HCC) Comments: Chronic, I will check labs as below. He is reminded to avoid NSAIDs, to stay well hydrated  and to keep BP/BS controlled to decrease risk of CKD progression. - Hemoglobin A1c - CMP14+EGFR  2. Parenchymal renal hypertension, stage 1 through stage 4 or unspecified chronic kidney disease Comments: Chronic, uncontrolled.  He will c/w amlodipine 10mg , lisinopril 40mg  and hctz 25mg  daily. He is encouraged to follow low sodium diet.  3. Pure hypercholesterolemia Comments: Chronic, LDL goal <70. He will c/w atorvastatin 40mg  daily. Encouraged to follow heart healthy  diet. - Lipid panel - CMP14+EGFR  4. Class 1 obesity due to excess calories with serious comorbidity and body mass index (BMI) of 31.0 to 31.9 in adult  She is encouraged to strive for BMI less than 30 to decrease cardiac risk. Advised to aim for at least 150 minutes of exercise per week.     Patient was given opportunity to ask questions. Patient verbalized understanding of the plan and was able to repeat key elements of the plan. All questions were answered to their satisfaction.   I, [name], MD, have reviewed all documentation for this visit. The documentation on 10/26/22 for the exam, diagnosis, procedures, and orders are all accurate and  complete.   IF YOU HAVE BEEN REFERRED TO A SPECIALIST, IT MAY TAKE 1-2 WEEKS TO SCHEDULE/PROCESS THE REFERRAL. IF YOU HAVE NOT HEARD FROM US/SPECIALIST IN TWO WEEKS, PLEASE GIVE [place] A CALL AT [phone].   THE PATIENT IS ENCOURAGED TO PRACTICE SOCIAL DISTANCING DUE TO THE COVID-19 PANDEMIC.

## 2022-10-27 LAB — LIPID PANEL
Chol/HDL Ratio: 4.3 ratio (ref 0.0–5.0)
Cholesterol, Total: 119 mg/dL (ref 100–199)
HDL: 28 mg/dL — ABNORMAL LOW (ref >39)
LDL Chol Calc (NIH): 40 mg/dL (ref 0–99)
Triglycerides: 339 mg/dL — ABNORMAL HIGH (ref 0–149)
VLDL Cholesterol Cal: 51 mg/dL — ABNORMAL HIGH (ref 5–40)

## 2022-10-27 LAB — CMP14+EGFR
ALT: 25 IU/L (ref 0–44)
AST: 24 IU/L (ref 0–40)
Albumin/Globulin Ratio: 1.6 (ref 1.2–2.2)
Albumin: 4.4 g/dL (ref 3.9–4.9)
Alkaline Phosphatase: 87 IU/L (ref 44–121)
BUN/Creatinine Ratio: 12 (ref 10–24)
BUN: 14 mg/dL (ref 8–27)
Bilirubin Total: 0.3 mg/dL (ref 0.0–1.2)
CO2: 22 mmol/L (ref 20–29)
Calcium: 9.9 mg/dL (ref 8.6–10.2)
Chloride: 95 mmol/L — ABNORMAL LOW (ref 96–106)
Creatinine, Ser: 1.15 mg/dL (ref 0.76–1.27)
Globulin, Total: 2.7 g/dL (ref 1.5–4.5)
Glucose: 148 mg/dL — ABNORMAL HIGH (ref 70–99)
Potassium: 4.1 mmol/L (ref 3.5–5.2)
Sodium: 135 mmol/L (ref 134–144)
Total Protein: 7.1 g/dL (ref 6.0–8.5)
eGFR: 69 mL/min/{1.73_m2} (ref >59)

## 2022-10-27 LAB — HEMOGLOBIN A1C
Est. average glucose Bld gHb Est-mCnc: 246 mg/dL
Hgb A1c MFr Bld: 10.2 % — ABNORMAL HIGH (ref 4.8–5.6)

## 2023-01-11 ENCOUNTER — Other Ambulatory Visit: Payer: Self-pay | Admitting: Internal Medicine

## 2023-02-03 ENCOUNTER — Telehealth: Payer: Self-pay

## 2023-02-03 NOTE — Progress Notes (Signed)
Patient attempted to be outreached by Demaya Hardge, PharmD Candidate on 02/03/23 to discuss hypertension. Left voicemail for patient to return our call at their convenience at [phone].  Cayde Held, Student-PharmD  

## 2023-02-08 ENCOUNTER — Ambulatory Visit: Payer: Medicare HMO | Admitting: Internal Medicine

## 2023-03-08 ENCOUNTER — Encounter: Payer: Self-pay | Admitting: Internal Medicine

## 2023-03-08 ENCOUNTER — Ambulatory Visit (INDEPENDENT_AMBULATORY_CARE_PROVIDER_SITE_OTHER): Payer: Medicare HMO | Admitting: Internal Medicine

## 2023-03-08 VITALS — BP 122/80 | HR 88 | Temp 98.3°F | Ht 68.0 in | Wt 208.2 lb

## 2023-03-08 DIAGNOSIS — E1122 Type 2 diabetes mellitus with diabetic chronic kidney disease: Secondary | ICD-10-CM | POA: Diagnosis not present

## 2023-03-08 DIAGNOSIS — I129 Hypertensive chronic kidney disease with stage 1 through stage 4 chronic kidney disease, or unspecified chronic kidney disease: Secondary | ICD-10-CM

## 2023-03-08 DIAGNOSIS — E6609 Other obesity due to excess calories: Secondary | ICD-10-CM | POA: Diagnosis not present

## 2023-03-08 DIAGNOSIS — N1831 Chronic kidney disease, stage 3a: Secondary | ICD-10-CM

## 2023-03-08 DIAGNOSIS — Z6831 Body mass index (BMI) 31.0-31.9, adult: Secondary | ICD-10-CM

## 2023-03-08 DIAGNOSIS — Z794 Long term (current) use of insulin: Secondary | ICD-10-CM

## 2023-03-08 MED ORDER — EMPAGLIFLOZIN 25 MG PO TABS: 25.0000 mg | ORAL_TABLET | Freq: Every day | ORAL | 2 refills | Status: DC

## 2023-03-08 MED ORDER — JANUMET 50-1000 MG PO TABS: 1.0000 | ORAL_TABLET | Freq: Two times a day (BID) | ORAL | 2 refills | Status: DC

## 2023-03-08 NOTE — Progress Notes (Signed)
I,Marcus Davis, CMA,acting as a Neurosurgeon for Marcus Aliment, MD.,have documented all relevant documentation on the behalf of Marcus Aliment, MD,as directed by  Marcus Aliment, MD while in the presence of Marcus Aliment, MD.  Subjective:  Patient ID: Marcus Davis , male    DOB: 01-May-1951 , 72 y.o.   MRN: [id number]  Chief Complaint  Patient presents with   Hypertension   Diabetes    HPI  Patient presents today for diabetes, bp & cholesterol check. Patient states compliance with medications.  Denies headache, chest pain, and SOB. He reports having an appt scheduled with ophthalmologist on 7/16.  He adds he is now taking ginger root. He admits seeing an significant change in his BS since doing this. He asks is it okay to continue?   Diabetes He presents for his follow-up diabetic visit. He has type 2 diabetes mellitus. There are no hypoglycemic associated symptoms. There are no diabetic associated symptoms. Pertinent negatives for diabetes include no blurred vision, no polydipsia, no polyphagia and no polyuria. There are no hypoglycemic complications. Risk factors for coronary artery disease include diabetes mellitus, dyslipidemia, hypertension, male sex and sedentary lifestyle. He is compliant with treatment most of the time. He is following a diabetic diet. He participates in exercise intermittently. Eye exam is current.  Hypertension This is a chronic problem. The current episode started more than 1 year ago. The problem has been gradually improving since onset. The problem is controlled. Pertinent negatives include no blurred vision or palpitations. The current treatment provides moderate improvement.     Past Medical History:  Diagnosis Date   Allergy    DM (diabetes mellitus) (HCC)    HTN (hypertension)    Hyperlipidemia      Family History  Problem Relation Age of Onset   Diabetes Mother    Hypertension Mother    Stroke Mother    Hypertension Father    Diabetes Father     Stroke Paternal Grandfather      Current Outpatient Medications:    Alcohol Swabs (ALCOHOL WIPES) 70 % PADS, USE PAD TO AFFECTED AREA DAILY, Disp: , Rfl:    amLODipine (NORVASC) 10 MG tablet, TAKE ONE TABLET BY MOUTH DAILY FOR HEART, Disp: , Rfl:    aspirin EC 81 MG tablet, Take 81 mg by mouth daily., Disp: , Rfl:    atorvastatin (LIPITOR) 40 MG tablet, TAKE ONE-HALF TABLET BY MOUTH AT BEDTIME FOR CHOLESTEROL *REPLACES ROSUVASTATIN (CRESTOR)*, Disp: , Rfl:    diphenhydrAMINE (BENADRYL) 25 mg capsule, TAKE ONE CAPSULE BY MOUTH AT BEDTIME FOR NIGHTTIME CONGESTION * MAY CAUSE DROWSINESS *, Disp: , Rfl:    hydrochlorothiazide (HYDRODIURIL) 25 MG tablet, Take 25 mg by mouth daily. , Disp: , Rfl:    insulin glargine (LANTUS) 100 UNIT/ML injection, Inject 36 Units into the skin. 42 units sq nightly, Disp: , Rfl:    lisinopril (PRINIVIL,ZESTRIL) 40 MG tablet, Take 40 mg by mouth daily. , Disp: , Rfl:    Vitamin D, Cholecalciferol, 25 MCG (1000 UT) CAPS, Take by mouth. 1000 units Monday-Friday, 2000 units Saturday and Sunday, Disp: , Rfl:    cyanocobalamin 100 MCG tablet, Take by mouth. (Patient not taking: Reported on 10/26/2022), Disp: , Rfl:    empagliflozin (JARDIANCE) 25 MG TABS tablet, Take 1 tablet (25 mg total) by mouth daily before breakfast., Disp: 90 tablet, Rfl: 2   sitaGLIPtin-metformin (JANUMET) 50-1000 MG tablet, Take 1 tablet by mouth 2 (two) times daily with a meal., Disp:  180 tablet, Rfl: 2   Allergies  Allergen Reactions   Pollen Extract Other (See Comments)    Sneezing, runny nose, congestion,      Review of Systems  Constitutional: Negative.   HENT: Negative.    Eyes:  Negative for blurred vision.  Respiratory: Negative.    Cardiovascular: Negative.  Negative for palpitations.  Gastrointestinal: Negative.   Endocrine: Negative for polydipsia, polyphagia and polyuria.  Musculoskeletal: Negative.   Skin: Negative.   Allergic/Immunologic: Negative.   Neurological:  Negative.   Hematological: Negative.      Today's Vitals   03/08/23 1113  BP: 122/80  Pulse: 88  Temp: 98.3 F (36.8 C)  SpO2: 98%  Weight: 208 lb 3.2 oz (94.4 kg)  Height: 5\' 8"  (1.727 m)   Body mass index is 31.66 kg/m.  Wt Readings from Last 3 Encounters:  03/08/23 208 lb 3.2 oz (94.4 kg)  10/26/22 208 lb 3.2 oz (94.4 kg)  08/10/22 215 lb 12.8 oz (97.9 kg)    The ASCVD Risk score (Arnett DK, et al., 2019) failed to calculate for the following reasons:   The valid total cholesterol range is 130 to 320 mg/dL  Objective:  Physical Exam Vitals and nursing note reviewed.  Constitutional:      Appearance: Normal appearance.  HENT:     Head: Normocephalic and atraumatic.  Eyes:     Extraocular Movements: Extraocular movements intact.  Cardiovascular:     Rate and Rhythm: Normal rate and regular rhythm.     Heart sounds: Normal heart sounds.  Pulmonary:     Effort: Pulmonary effort is normal.     Breath sounds: Normal breath sounds.  Musculoskeletal:     Cervical back: Normal range of motion.  Skin:    General: Skin is warm.  Neurological:     General: No focal deficit present.     Mental Status: He is alert.  Psychiatric:        Mood and Affect: Mood normal.         Assessment And Plan:  1. Type 2 diabetes mellitus with stage 3a chronic kidney disease, with long-term current use of insulin (HCC) Comments: Chronic, also managed by the [place].  He will c/w Lantus 42 units nightly, Jardiance 25mg  daily and Janumet 50/1000 twice daily. He will f/u 3 months. - Hemoglobin A1c - TSH - CMP14+EGFR  2. Parenchymal renal hypertension, stage 1 through stage 4 or unspecified chronic kidney disease Comments: Chronic, well controlled. He will c/w amlodipine 10mg , lisinopril 40mg  and hctz 25mg  daily.  3. Class 1 obesity due to excess calories with serious comorbidity and body mass index (BMI) of 31.0 to 31.9 in adult He is encouraged to strive for BMI less than 30 to decrease  cardiac risk. Advised to aim for at least 150 minutes of exercise per week.   Return if symptoms worsen or fail to improve.   Patient was given opportunity to ask questions. Patient verbalized understanding of the plan and was able to repeat key elements of the plan. All questions were answered to their satisfaction.   I, Marcus Aliment, MD, have reviewed all documentation for this visit. The documentation on 03/08/23 for the exam, diagnosis, procedures, and orders are all accurate and complete.   IF YOU HAVE BEEN REFERRED TO A SPECIALIST, IT MAY TAKE 1-2 WEEKS TO SCHEDULE/PROCESS THE REFERRAL. IF YOU HAVE NOT HEARD FROM US/SPECIALIST IN TWO WEEKS, PLEASE GIVE [place] A CALL AT [phone].

## 2023-03-08 NOTE — Patient Instructions (Signed)
Hypertension, Adult Hypertension is another name for high blood pressure. High blood pressure forces your heart to work harder to pump blood. This can cause problems over time. There are two numbers in a blood pressure reading. There is a top number (systolic) over a bottom number (diastolic). It is best to have a blood pressure that is below 120/80. What are the causes? The cause of this condition is not known. Some other conditions can lead to high blood pressure. What increases the risk? Some lifestyle factors can make you more likely to develop high blood pressure: Smoking. Not getting enough exercise or physical activity. Being overweight. Having too much fat, sugar, calories, or salt (sodium) in your diet. Drinking too much alcohol. Other risk factors include: Having any of these conditions: Heart disease. Diabetes. High cholesterol. Kidney disease. Obstructive sleep apnea. Having a family history of high blood pressure and high cholesterol. Age. The risk increases with age. Stress. What are the signs or symptoms? High blood pressure may not cause symptoms. Very high blood pressure (hypertensive crisis) may cause: Headache. Fast or uneven heartbeats (palpitations). Shortness of breath. Nosebleed. Vomiting or feeling like you may vomit (nauseous). Changes in how you see. Very bad chest pain. Feeling dizzy. Seizures. How is this treated? This condition is treated by making healthy lifestyle changes, such as: Eating healthy foods. Exercising more. Drinking less alcohol. Your doctor may prescribe medicine if lifestyle changes do not help enough and if: Your top number is above 130. Your bottom number is above 80. Your personal target blood pressure may vary. Follow these instructions at home: Eating and drinking  If told, follow the DASH eating plan. To follow this plan: Fill one half of your plate at each meal with fruits and vegetables. Fill one fourth of your plate  at each meal with whole grains. Whole grains include whole-wheat pasta, brown rice, and whole-grain bread. Eat or drink low-fat dairy products, such as skim milk or low-fat yogurt. Fill one fourth of your plate at each meal with low-fat (lean) proteins. Low-fat proteins include fish, chicken without skin, eggs, beans, and tofu. Avoid fatty meat, cured and processed meat, or chicken with skin. Avoid pre-made or processed food. Limit the amount of salt in your diet to less than 1,500 mg each day. Do not drink alcohol if: Your doctor tells you not to drink. You are pregnant, may be pregnant, or are planning to become pregnant. If you drink alcohol: Limit how much you have to: 0-1 drink a day for women. 0-2 drinks a day for men. Know how much alcohol is in your drink. In the [place] one drink equals one 12 oz bottle of beer (355 mL), one 5 oz glass of wine (148 mL), or one 1 oz glass of hard liquor (44 mL). Lifestyle  Work with your doctor to stay at a healthy weight or to lose weight. Ask your doctor what the best weight is for you. Get at least 30 minutes of exercise that causes your heart to beat faster (aerobic exercise) most days of the week. This may include walking, swimming, or biking. Get at least 30 minutes of exercise that strengthens your muscles (resistance exercise) at least 3 days a week. This may include lifting weights or doing Pilates. Do not smoke or use any products that contain nicotine or tobacco. If you need help quitting, ask your doctor. Check your blood pressure at home as told by your doctor. Keep all follow-up visits. Medicines Take over-the-counter and prescription medicines   only as told by your doctor. Follow directions carefully. Do not skip doses of blood pressure medicine. The medicine does not work as well if you skip doses. Skipping doses also puts you at risk for problems. Ask your doctor about side effects or reactions to medicines that you should watch  for. Contact a doctor if: You think you are having a reaction to the medicine you are taking. You have headaches that keep coming back. You feel dizzy. You have swelling in your ankles. You have trouble with your vision. Get help right away if: You get a very bad headache. You start to feel mixed up (confused). You feel weak or numb. You feel faint. You have very bad pain in your: Chest. Belly (abdomen). You vomit more than once. You have trouble breathing. These symptoms may be an emergency. Get help right away. Call 911. Do not wait to see if the symptoms will go away. Do not drive yourself to the hospital. Summary Hypertension is another name for high blood pressure. High blood pressure forces your heart to work harder to pump blood. For most people, a normal blood pressure is less than 120/80. Making healthy choices can help lower blood pressure. If your blood pressure does not get lower with healthy choices, you may need to take medicine. This information is not intended to replace advice given to you by your health care provider. Make sure you discuss any questions you have with your health care provider. Document Revised: 06/18/2021 Document Reviewed: 06/18/2021 Elsevier Patient Education  2024 [employer]  

## 2023-03-09 LAB — CMP14+EGFR
ALT: 23 IU/L (ref 0–44)
AST: 19 IU/L (ref 0–40)
Albumin: 4.3 g/dL (ref 3.9–4.9)
Alkaline Phosphatase: 82 IU/L (ref 44–121)
BUN/Creatinine Ratio: 10 (ref 10–24)
BUN: 12 mg/dL (ref 8–27)
Bilirubin Total: 0.2 mg/dL (ref 0.0–1.2)
CO2: 24 mmol/L (ref 20–29)
Calcium: 9.8 mg/dL (ref 8.6–10.2)
Chloride: 100 mmol/L (ref 96–106)
Creatinine, Ser: 1.24 mg/dL (ref 0.76–1.27)
Globulin, Total: 2.8 g/dL (ref 1.5–4.5)
Glucose: 154 mg/dL — ABNORMAL HIGH (ref 70–99)
Potassium: 4.5 mmol/L (ref 3.5–5.2)
Sodium: 140 mmol/L (ref 134–144)
Total Protein: 7.1 g/dL (ref 6.0–8.5)
eGFR: 63 mL/min/{1.73_m2} (ref >59)

## 2023-03-09 LAB — HEMOGLOBIN A1C
Est. average glucose Bld gHb Est-mCnc: 209 mg/dL
Hgb A1c MFr Bld: 8.9 % — ABNORMAL HIGH (ref 4.8–5.6)

## 2023-03-09 LAB — TSH: TSH: 1.72 u[IU]/mL (ref 0.450–4.500)

## 2023-03-30 LAB — HM DIABETES EYE EXAM

## 2023-05-25 ENCOUNTER — Encounter: Payer: Self-pay | Admitting: Internal Medicine

## 2023-05-25 ENCOUNTER — Ambulatory Visit (INDEPENDENT_AMBULATORY_CARE_PROVIDER_SITE_OTHER): Payer: Medicare HMO

## 2023-05-25 ENCOUNTER — Ambulatory Visit: Payer: Medicare HMO | Admitting: Internal Medicine

## 2023-05-25 VITALS — BP 128/70 | HR 85 | Temp 98.5°F | Ht 68.0 in | Wt 209.8 lb

## 2023-05-25 VITALS — BP 128/70 | HR 85 | Temp 98.5°F | Ht 68.0 in | Wt 209.0 lb

## 2023-05-25 DIAGNOSIS — E7800 Pure hypercholesterolemia, unspecified: Secondary | ICD-10-CM

## 2023-05-25 DIAGNOSIS — Z794 Long term (current) use of insulin: Secondary | ICD-10-CM

## 2023-05-25 DIAGNOSIS — Z6831 Body mass index (BMI) 31.0-31.9, adult: Secondary | ICD-10-CM

## 2023-05-25 DIAGNOSIS — N182 Chronic kidney disease, stage 2 (mild): Secondary | ICD-10-CM

## 2023-05-25 DIAGNOSIS — I129 Hypertensive chronic kidney disease with stage 1 through stage 4 chronic kidney disease, or unspecified chronic kidney disease: Secondary | ICD-10-CM

## 2023-05-25 DIAGNOSIS — E6609 Other obesity due to excess calories: Secondary | ICD-10-CM

## 2023-05-25 DIAGNOSIS — E1122 Type 2 diabetes mellitus with diabetic chronic kidney disease: Secondary | ICD-10-CM

## 2023-05-25 DIAGNOSIS — D649 Anemia, unspecified: Secondary | ICD-10-CM

## 2023-05-25 DIAGNOSIS — Z0000 Encounter for general adult medical examination without abnormal findings: Secondary | ICD-10-CM | POA: Diagnosis not present

## 2023-05-25 NOTE — Assessment & Plan Note (Signed)
He denies gingival and rectal bleeding. I will check labs as below. Possibly related to CKD.

## 2023-05-25 NOTE — Patient Instructions (Signed)

## 2023-05-25 NOTE — Progress Notes (Signed)
Subjective:   Marcus Davis is a 72 y.o. male who presents for Medicare Annual/Subsequent preventive examination.  Visit Complete: In person    Review of Systems     Cardiac Risk Factors include: advanced age (>49men, >70 women);diabetes mellitus;hypertension;male gender     Objective:    Today's Vitals   05/25/23 1059  BP: 128/70  Pulse: 85  Temp: 98.5 F (36.9 C)  TempSrc: Oral  SpO2: 98%  Weight: 209 lb (94.8 kg)  Height: 5\' 8"  (1.727 m)   Body mass index is 31.78 kg/m.     05/25/2023   11:05 AM 05/13/2022    2:21 PM 04/16/2021    2:15 PM 04/09/2020    2:29 PM 02/07/2020    2:14 PM 07/17/2019    9:38 AM 01/30/2019   11:20 AM  Advanced Directives  Does Patient Have a Medical Advance Directive? No No No No No No No  Would patient like information on creating a medical advance directive? No - Patient declined No - Patient declined Yes (MAU/Ambulatory/Procedural Areas - Information given)  Yes (MAU/Ambulatory/Procedural Areas - Information given) No - Patient declined No - Patient declined    Current Medications (verified) Outpatient Encounter Medications as of 05/25/2023  Medication Sig   Alcohol Swabs (ALCOHOL WIPES) 70 % PADS USE PAD TO AFFECTED AREA DAILY   amLODipine (NORVASC) 10 MG tablet TAKE ONE TABLET BY MOUTH DAILY FOR HEART   aspirin EC 81 MG tablet Take 81 mg by mouth daily.   atorvastatin (LIPITOR) 40 MG tablet TAKE ONE-HALF TABLET BY MOUTH AT BEDTIME FOR CHOLESTEROL *REPLACES ROSUVASTATIN (CRESTOR)*   empagliflozin (JARDIANCE) 25 MG TABS tablet Take 1 tablet (25 mg total) by mouth daily before breakfast.   hydrochlorothiazide (HYDRODIURIL) 25 MG tablet Take 25 mg by mouth daily.    insulin glargine (LANTUS) 100 UNIT/ML injection Inject 36 Units into the skin. 42 units sq nightly   lisinopril (PRINIVIL,ZESTRIL) 40 MG tablet Take 40 mg by mouth daily.    sitaGLIPtin-metformin (JANUMET) 50-1000 MG tablet Take 1 tablet by mouth 2 (two) times daily with a meal.    Vitamin D, Cholecalciferol, 25 MCG (1000 UT) CAPS Take by mouth. 1000 units Monday-Friday, 2000 units Saturday and Sunday   cyanocobalamin 100 MCG tablet Take by mouth. (Patient not taking: Reported on 10/26/2022)   diphenhydrAMINE (BENADRYL) 25 mg capsule TAKE ONE CAPSULE BY MOUTH AT BEDTIME FOR NIGHTTIME CONGESTION * MAY CAUSE DROWSINESS * (Patient not taking: Reported on 05/25/2023)   No facility-administered encounter medications on file as of 05/25/2023.    Allergies (verified) Pollen extract   History: Past Medical History:  Diagnosis Date   Allergy    DM (diabetes mellitus) (HCC)    HTN (hypertension)    Hyperlipidemia    Past Surgical History:  Procedure Laterality Date   CATARACT EXTRACTION Right 2019   CATARACT EXTRACTION Left 09/21/2021   TONSILLECTOMY     age 8   Family History  Problem Relation Age of Onset   Diabetes Mother    Hypertension Mother    Stroke Mother    Hypertension Father    Diabetes Father    Stroke Paternal Grandfather    Social History   Socioeconomic History   Marital status: Widowed    Spouse name: Not on file   Number of children: Not on file   Years of education: Not on file   Highest education level: Not on file  Occupational History   Occupation: retired  Tobacco Use   Smoking  status: Never   Smokeless tobacco: Never  Vaping Use   Vaping status: Never Used  Substance and Sexual Activity   Alcohol use: Not Currently    Comment: occasionally   Drug use: Never   Sexual activity: Not Currently  Other Topics Concern   Not on file  Social History Narrative   Not on file   Social Determinants of Health   Financial Resource Strain: Low Risk  (05/25/2023)   Overall Financial Resource Strain (CARDIA)    Difficulty of Paying Living Expenses: Not hard at all  Food Insecurity: No Food Insecurity (05/25/2023)   Hunger Vital Sign    Worried About Running Out of Food in the Last Year: Never true    Ran Out of Food in the Last Year:  Never true  Transportation Needs: No Transportation Needs (05/25/2023)   PRAPARE - Administrator, Civil Service (Medical): No    Lack of Transportation (Non-Medical): No  Physical Activity: Sufficiently Active (05/25/2023)   Exercise Vital Sign    Days of Exercise per Week: 3 days    Minutes of Exercise per Session: 60 min  Stress: No Stress Concern Present (05/25/2023)   [employer] of Occupational Health - Occupational Stress Questionnaire    Feeling of Stress : Not at all  Social Connections: Moderately Isolated (05/25/2023)   Social Connection and Isolation Panel [NHANES]    Frequency of Communication with Friends and Family: More than three times a week    Frequency of Social Gatherings with Friends and Family: More than three times a week    Attends Religious Services: More than 4 times per year    Active Member of [employer] or Organizations: No    Attends Banker Meetings: Never    Marital Status: Widowed    Tobacco Counseling Counseling given: Not Answered   Clinical Intake:  Pre-visit preparation completed: Yes  Pain : No/denies pain     Nutritional Status: BMI > 30  Obese Nutritional Risks: None Diabetes: Yes CBG done?: No Did pt. bring in CBG monitor from home?: No  How often do you need to have someone help you when you read instructions, pamphlets, or other written materials from your doctor or pharmacy?: 1 - Never  Interpreter Needed?: No  Information entered by :: NAllen LPN   Activities of Daily Living    05/25/2023   11:00 AM  In your present state of health, do you have any difficulty performing the following activities:  Hearing? 0  Vision? 0  Difficulty concentrating or making decisions? 0  Walking or climbing stairs? 0  Dressing or bathing? 0  Doing errands, shopping? 0  Preparing Food and eating ? N  Using the Toilet? N  In the past six months, have you accidently leaked urine? N  Do you have problems with loss  of bowel control? N  Managing your Medications? N  Managing your Finances? N  Housekeeping or managing your Housekeeping? N    Patient Care Team: [name], MD as PCP - General (Internal Medicine) Harlan Stains, [facility] (Inactive) (Pharmacist)  Indicate any recent Medical Services you may have received from other than [facility] providers in the past year (date may be approximate).     Assessment:   This is a routine wellness examination for Marcus Davis.  Hearing/Vision screen Hearing Screening - Comments:: Denies hearing issues Vision Screening - Comments:: [facility]   Goals Addressed  This Visit's Progress    Patient Stated       05/25/2023, maintain medications and watch eating       Depression Screen    05/25/2023   11:08 AM 03/08/2023   11:15 AM 08/10/2022   11:26 AM 05/13/2022    2:22 PM 04/16/2021    2:17 PM 04/09/2020    2:30 PM 05/30/2019   10:45 AM  PHQ 2/9 Scores  PHQ - 2 Score 0 0 0 0 0 0 0  PHQ- 9 Score 0 0    2     Fall Risk    05/25/2023   11:05 AM 03/08/2023   11:15 AM 08/10/2022   11:26 AM 05/13/2022    2:21 PM 04/16/2021    2:16 PM  Fall Risk   Falls in the past year? 0 0 0 0 0  Number falls in past yr: 0 0 0 0   Injury with Fall? 0 0 0 0   Risk for fall due to : Medication side effect No Fall Risks No Fall Risks No Fall Risks;Medication side effect Medication side effect  Follow up Falls prevention discussed;Falls evaluation completed Falls evaluation completed Falls evaluation completed Falls prevention discussed;Education provided;Falls evaluation completed Falls evaluation completed;Education provided;Falls prevention discussed    MEDICARE RISK AT HOME: Medicare Risk at Home Any stairs in or around the home?: No If so, are there any without handrails?: No Home free of loose throw rugs in walkways, pet beds, electrical cords, etc?: Yes Adequate lighting in your home to reduce risk of falls?: Yes Life alert?: No Use of a  cane, walker or w/c?: No Grab bars in the bathroom?: Yes Shower chair or bench in shower?: No Elevated toilet seat or a handicapped toilet?: No  TIMED UP AND GO:  Was the test performed?  Yes  Length of time to ambulate 10 feet: 5 sec Gait steady and fast without use of assistive device    Cognitive Function:        05/25/2023   11:08 AM 05/13/2022    2:22 PM 04/16/2021    2:17 PM 04/09/2020    2:32 PM 01/30/2019   11:23 AM  6CIT Screen  What Year? 0 points 0 points 0 points 0 points 0 points  What month? 0 points 0 points 0 points 0 points 0 points  What time? 0 points 0 points 0 points 0 points 0 points  Count back from 20 0 points 0 points 0 points 0 points 0 points  Months in reverse 0 points 0 points 0 points 0 points 0 points  Repeat phrase 0 points 2 points 0 points 2 points 0 points  Total Score 0 points 2 points 0 points 2 points 0 points    Immunizations Immunization History  Administered Date(s) Administered   Covid-19, Mrna,Vaccine(Spikevax)47yrs and older 10/01/2022   [place] Dibbles (J&J) SARS-COV-2 Vaccination 12/17/2019   Moderna Sars-Covid-2 Vaccination 07/28/2020, 01/30/2021   Pfizer Covid-19 Vaccine Bivalent Booster 59yrs & up 07/14/2021   Pneumococcal Conjugate-13 12/12/2015   Pneumococcal Polysaccharide-23 10/11/2014   Tdap 10/11/2014   Zoster Recombinant(Shingrix) 01/30/2021, 05/01/2021    TDAP status: Up to date  Flu Vaccine status: Declined, Education has been provided regarding the importance of this vaccine but patient still declined. Advised may receive this vaccine at local pharmacy or Health Dept. Aware to provide a copy of the vaccination record if obtained from local pharmacy or Health Dept. Verbalized acceptance and understanding.  Pneumococcal vaccine status: Declined,  Education has been provided  regarding the importance of this vaccine but patient still declined. Advised may receive this vaccine at local pharmacy or Health Dept. Aware to provide a  copy of the vaccination record if obtained from local pharmacy or Health Dept. Verbalized acceptance and understanding.   Covid-19 vaccine status: Completed vaccines  Qualifies for Shingles Vaccine? Yes   Zostavax completed Yes   Shingrix Completed?: Yes  Screening Tests Health Maintenance  Topic Date Due   Diabetic kidney evaluation - Urine ACR  05/14/2023   COVID-19 Vaccine (6 - 2023-24 season) 05/15/2023   INFLUENZA VACCINE  12/12/2023 (Originally 04/14/2023)   Pneumonia Vaccine 35+ Years old (3 of 3 - PPSV23 or PCV20) 05/24/2024 (Originally 12/11/2020)   HEMOGLOBIN A1C  09/07/2023   FOOT EXAM  09/15/2023   Diabetic kidney evaluation - eGFR measurement  03/07/2024   OPHTHALMOLOGY EXAM  03/29/2024   Medicare Annual Wellness (AWV)  05/24/2024   DTaP/Tdap/Td (2 - Td or Tdap) 10/11/2024   Fecal DNA (Cologuard)  05/25/2025   Hepatitis C Screening  Completed   Zoster Vaccines- Shingrix  Completed   HPV VACCINES  Aged Out   Colonoscopy  Discontinued    Health Maintenance  Health Maintenance Due  Topic Date Due   Diabetic kidney evaluation - Urine ACR  05/14/2023   COVID-19 Vaccine (6 - 2023-24 season) 05/15/2023    Colorectal cancer screening: scheduled for 09/21/2023  Lung Cancer Screening: (Low Dose CT Chest recommended if Age 97-80 years, 20 pack-year currently smoking OR have quit w/in 15years.) does not qualify.   Lung Cancer Screening Referral: no  Additional Screening:  Hepatitis C Screening: does qualify; Completed 11/17/2015  Vision Screening: Recommended annual ophthalmology exams for early detection of glaucoma and other disorders of the eye. Is the patient up to date with their annual eye exam?  Yes  Who is the provider or what is the name of the office in which the patient attends [facility] exams? [facility] If pt is not established with a provider, would they like to be referred to a provider to establish care? No .   Dental Screening: Recommended annual dental exams  for proper oral hygiene  Diabetic Foot Exam: Diabetic Foot Exam: Completed 09/14/2022  Community Resource Referral / Chronic Care Management: CRR required this visit?  No   CCM required this visit?  No     Plan:     I have personally reviewed and noted the following in the patient's chart:   Medical and social history Use of alcohol, tobacco or illicit drugs  Current medications and supplements including opioid prescriptions. Patient is not currently taking opioid prescriptions. Functional ability and status Nutritional status Physical activity Advanced directives List of other physicians Hospitalizations, surgeries, and ER visits in previous 12 months Vitals Screenings to include cognitive, depression, and falls Referrals and appointments  In addition, I have reviewed and discussed with patient certain preventive protocols, quality metrics, and best practice recommendations. A written personalized care plan for preventive services as well as general preventive health recommendations were provided to patient.     Barb Merino, LPN   1/61/0960   After Visit Summary: in person  Nurse Notes: none

## 2023-05-25 NOTE — Patient Instructions (Signed)
Marcus Davis , Thank you for taking time to come for your Medicare Wellness Visit. I appreciate your ongoing commitment to your health goals. Please review the following plan we discussed and let me know if I can assist you in the future.   Referrals/Orders/Follow-Ups/Clinician Recommendations: none  This is a list of the screening recommended for you and due dates:  Health Maintenance  Topic Date Due   Eye exam for diabetics  02/20/2023   Yearly kidney health urinalysis for diabetes  05/14/2023   COVID-19 Vaccine (6 - 2023-24 season) 05/15/2023   Flu Shot  12/12/2023*   Pneumonia Vaccine (3 of 3 - PPSV23 or PCV20) 05/24/2024*   Hemoglobin A1C  09/07/2023   Complete foot exam   09/15/2023   Yearly kidney function blood test for diabetes  03/07/2024   Medicare Annual Wellness Visit  05/24/2024   DTaP/Tdap/Td vaccine (2 - Td or Tdap) 10/11/2024   Cologuard (Stool DNA test)  05/25/2025   Hepatitis C Screening  Completed   Zoster (Shingles) Vaccine  Completed   HPV Vaccine  Aged Out   Colon Cancer Screening  Discontinued  *Topic was postponed. The date shown is not the original due date.    Advanced directives: (Declined) Advance directive discussed with you today. Even though you declined this today, please call our office should you change your mind, and we can give you the proper paperwork for you to fill out.  Next Medicare Annual Wellness Visit scheduled for next year: Yes  insert Preventive Care attachment Insert FALL PREVENTION attachment if needed

## 2023-05-25 NOTE — Assessment & Plan Note (Addendum)
Chronic, now on insulin. He will continue with Jardiance 25mg  daily, Lantus 36 units nightly and Janumet 50/1000mg  twice daily. HE is reminded to follow dietary recommendations - no sodas, sugary beverages. Chronic, he is encouraged to stay well hydrated, avoid NSAIDs and keep BP controlled to prevent progression of CKD.  He will f/u in four months for re-evaluation.

## 2023-05-25 NOTE — Assessment & Plan Note (Signed)
Chronic, he is encouraged to stay well hydrated, avoid NSAIDs and keep BP/BS controlled to prevent progression of CKD.

## 2023-05-25 NOTE — Progress Notes (Signed)
I,[name], CMA,acting as a Neurosurgeon for [name], MD.,have documented all relevant documentation on the behalf of [name], MD,as directed by  [name], MD while in the presence of [name], MD.  Subjective:  Patient ID: Marcus Davis , male    DOB: August 12, 1951 , 72 y.o.   MRN: [id number]  Chief Complaint  Patient presents with   Diabetes   Hypertension    HPI  Patient presents today for diabetes, bp & cholesterol check. Patient states compliance with medications.  Denies headache, chest pain, and SOB. He reports no specific questions or concerns.   AWV completed with [facility] Ad[facility]: [place].   Diabetes He presents for his follow-up diabetic visit. He has type 2 diabetes mellitus. There are no hypoglycemic associated symptoms. There are no diabetic associated symptoms. Pertinent negatives for diabetes include no blurred vision, no polydipsia, no polyphagia and no polyuria. There are no hypoglycemic complications. Risk factors for coronary artery disease include diabetes mellitus, dyslipidemia, hypertension, male sex and sedentary lifestyle. He is compliant with treatment most of the time. He is following a diabetic diet. He participates in exercise intermittently. Eye exam is current.  Hypertension This is a chronic problem. The current episode started more than 1 year ago. The problem has been gradually improving since onset. The problem is controlled. Pertinent negatives include no blurred vision or palpitations. The current treatment provides moderate improvement.     Past Medical History:  Diagnosis Date   Allergy    DM (diabetes mellitus) (HCC)    HTN (hypertension)    Hyperlipidemia      Family History  Problem Relation Age of Onset   Diabetes Mother    Hypertension Mother    Stroke Mother    Hypertension Father    Diabetes Father    Stroke Paternal Grandfather      Current Outpatient Medications:    Alcohol Swabs (ALCOHOL WIPES) 70 %  PADS, USE PAD TO AFFECTED AREA DAILY, Disp: , Rfl:    amLODipine (NORVASC) 10 MG tablet, TAKE ONE TABLET BY MOUTH DAILY FOR HEART, Disp: , Rfl:    aspirin EC 81 MG tablet, Take 81 mg by mouth daily., Disp: , Rfl:    atorvastatin (LIPITOR) 40 MG tablet, TAKE ONE-HALF TABLET BY MOUTH AT BEDTIME FOR CHOLESTEROL *REPLACES ROSUVASTATIN (CRESTOR)*, Disp: , Rfl:    cyanocobalamin 100 MCG tablet, Take by mouth. (Patient not taking: Reported on 10/26/2022), Disp: , Rfl:    diphenhydrAMINE (BENADRYL) 25 mg capsule, TAKE ONE CAPSULE BY MOUTH AT BEDTIME FOR NIGHTTIME CONGESTION * MAY CAUSE DROWSINESS * (Patient not taking: Reported on 05/25/2023), Disp: , Rfl:    empagliflozin (JARDIANCE) 25 MG TABS tablet, Take 1 tablet (25 mg total) by mouth daily before breakfast., Disp: 90 tablet, Rfl: 2   hydrochlorothiazide (HYDRODIURIL) 25 MG tablet, Take 25 mg by mouth daily. , Disp: , Rfl:    insulin glargine (LANTUS) 100 UNIT/ML injection, Inject 36 Units into the skin. 42 units sq nightly, Disp: , Rfl:    lisinopril (PRINIVIL,ZESTRIL) 40 MG tablet, Take 40 mg by mouth daily. , Disp: , Rfl:    sitaGLIPtin-metformin (JANUMET) 50-1000 MG tablet, Take 1 tablet by mouth 2 (two) times daily with a meal., Disp: 180 tablet, Rfl: 2   Vitamin D, Cholecalciferol, 25 MCG (1000 UT) CAPS, Take by mouth. 1000 units Monday-Friday, 2000 units Saturday and Sunday, Disp: , Rfl:    Allergies  Allergen Reactions   Pollen Extract Other (See Comments)  Sneezing, runny nose, congestion,      Review of Systems  Constitutional: Negative.   HENT: Negative.    Eyes:  Negative for blurred vision.  Respiratory: Negative.    Cardiovascular: Negative.  Negative for palpitations.  Endocrine: Negative for polydipsia, polyphagia and polyuria.  Skin: Negative.   Allergic/Immunologic: Negative.   Hematological: Negative.      Today's Vitals   05/25/23 1117  BP: 128/70  Pulse: 85  Temp: 98.5 F (36.9 C)  TempSrc: Oral  SpO2: 98%   Weight: 209 lb 12.8 oz (95.2 kg)  Height: 5\' 8"  (1.727 m)   Body mass index is 31.9 kg/m.  Wt Readings from Last 3 Encounters:  05/25/23 209 lb 12.8 oz (95.2 kg)  05/25/23 209 lb (94.8 kg)  03/08/23 208 lb 3.2 oz (94.4 kg)     Objective:  Physical Exam Vitals and nursing note reviewed.  Constitutional:      Appearance: Normal appearance.  HENT:     Head: Normocephalic and atraumatic.     Comments: Wears glasses Eyes:     Extraocular Movements: Extraocular movements intact.  Cardiovascular:     Rate and Rhythm: Normal rate and regular rhythm.     Heart sounds: Normal heart sounds.  Pulmonary:     Effort: Pulmonary effort is normal.     Breath sounds: Normal breath sounds.  Musculoskeletal:     Cervical back: Normal range of motion.  Skin:    General: Skin is warm.  Neurological:     General: No focal deficit present.     Mental Status: He is alert.  Psychiatric:        Mood and Affect: Mood normal.         Assessment And Plan:  Type 2 diabetes mellitus with stage 2 chronic kidney disease, with long-term current use of insulin (HCC) Assessment & Plan: Chronic, now on insulin. He will continue with Jardiance 25mg  daily, Lantus 36 units nightly and Janumet 50/1000mg  twice daily. HE is reminded to follow dietary recommendations - no sodas, sugary beverages. Chronic, he is encouraged to stay well hydrated, avoid NSAIDs and keep BP controlled to prevent progression of CKD.  He will f/u in four months for re-evaluation.   Orders: -     Hemoglobin A1c -     Microalbumin / creatinine urine ratio -     BMP8+eGFR  Parenchymal renal hypertension, stage 1 through stage 4 or unspecified chronic kidney disease Assessment & Plan: Chronic, fair control. Goal BP<120/80.  He will continue with amlodipine 10mg  daily, hydrochlorothiazide 25mg  daily and lsinopril 40mg  daily. Encouraged to follow low sodium diet.    Pure hypercholesterolemia Assessment & Plan: Chronic, LDL goal is  less than 70.  He will continue with atorvastatin 40mg  1/2 tab po qpm. He is encouraged to follow heart healthy lifestyle.    Anemia, unspecified type Assessment & Plan: He denies gingival and rectal bleeding. I will check labs as below. Possibly related to CKD.   Orders: -     CBC -     Iron, TIBC and Ferritin Panel -     Protein electrophoresis, serum  Class 1 obesity due to excess calories with serious comorbidity and body mass index (BMI) of 31.0 to 31.9 in adult Assessment & Plan: He is encouraged to initially strive for BMI less than 30 to decrease cardiac risk. He is advised to exercise no less than 150 minutes per week.     He is encouraged to strive for BMI less  than 30 to decrease cardiac risk. Advised to aim for at least 150 minutes of exercise per week.    Return for 1 year ov w rs & thn. 4 month dm .  Patient was given opportunity to ask questions. Patient verbalized understanding of the plan and was able to repeat key elements of the plan. All questions were answered to their satisfaction.    I, [name], MD, have reviewed all documentation for this visit. The documentation on 05/25/23 for the exam, diagnosis, procedures, and orders are all accurate and complete.   IF YOU HAVE BEEN REFERRED TO A SPECIALIST, IT MAY TAKE 1-2 WEEKS TO SCHEDULE/PROCESS THE REFERRAL. IF YOU HAVE NOT HEARD FROM US/SPECIALIST IN TWO WEEKS, PLEASE GIVE [place] A CALL AT [phone].   THE PATIENT IS ENCOURAGED TO PRACTICE SOCIAL DISTANCING DUE TO THE COVID-19 PANDEMIC.

## 2023-05-29 LAB — IRON,TIBC AND FERRITIN PANEL
Ferritin: 30 ng/mL (ref 30–400)
Iron Saturation: 23 % (ref 15–55)
Iron: 77 ug/dL (ref 38–169)
Total Iron Binding Capacity: 328 ug/dL (ref 250–450)
UIBC: 251 ug/dL (ref 111–343)

## 2023-05-29 LAB — HEMOGLOBIN A1C
Est. average glucose Bld gHb Est-mCnc: 186 mg/dL
Hgb A1c MFr Bld: 8.1 % — ABNORMAL HIGH (ref 4.8–5.6)

## 2023-05-29 LAB — MICROALBUMIN / CREATININE URINE RATIO
Creatinine, Urine: 46.6 mg/dL
Microalb/Creat Ratio: 23 mg/g{creat} (ref 0–29)
Microalbumin, Urine: 10.5 ug/mL

## 2023-05-29 LAB — PROTEIN ELECTROPHORESIS, SERUM
A/G Ratio: 1.1 (ref 0.7–1.7)
Albumin ELP: 3.6 g/dL (ref 2.9–4.4)
Alpha 1: 0.2 g/dL (ref 0.0–0.4)
Alpha 2: 1.1 g/dL — ABNORMAL HIGH (ref 0.4–1.0)
Beta: 0.9 g/dL (ref 0.7–1.3)
Gamma Globulin: 1.1 g/dL (ref 0.4–1.8)
Globulin, Total: 3.3 g/dL (ref 2.2–3.9)
Total Protein: 6.9 g/dL (ref 6.0–8.5)

## 2023-05-29 LAB — CBC
Hematocrit: 42.8 % (ref 37.5–51.0)
Hemoglobin: 14.4 g/dL (ref 13.0–17.7)
MCH: 30.6 pg (ref 26.6–33.0)
MCHC: 33.6 g/dL (ref 31.5–35.7)
MCV: 91 fL (ref 79–97)
Platelets: 249 10*3/uL (ref 150–450)
RBC: 4.71 x10E6/uL (ref 4.14–5.80)
RDW: 13.7 % (ref 11.6–15.4)
WBC: 6.4 10*3/uL (ref 3.4–10.8)

## 2023-05-29 LAB — BMP8+EGFR
BUN/Creatinine Ratio: 14 (ref 10–24)
BUN: 17 mg/dL (ref 8–27)
CO2: 21 mmol/L (ref 20–29)
Calcium: 10 mg/dL (ref 8.6–10.2)
Chloride: 100 mmol/L (ref 96–106)
Creatinine, Ser: 1.2 mg/dL (ref 0.76–1.27)
Glucose: 140 mg/dL — ABNORMAL HIGH (ref 70–99)
Potassium: 4.1 mmol/L (ref 3.5–5.2)
Sodium: 138 mmol/L (ref 134–144)
eGFR: 65 mL/min/{1.73_m2} (ref >59)

## 2023-06-02 NOTE — Assessment & Plan Note (Signed)
Chronic, fair control. Goal BP<120/80.  He will continue with amlodipine 10mg  daily, hydrochlorothiazide 25mg  daily and lsinopril 40mg  daily. Encouraged to follow low sodium diet.

## 2023-06-02 NOTE — Assessment & Plan Note (Signed)
He is encouraged to initially strive for BMI less than 30 to decrease cardiac risk. He is advised to exercise no less than 150 minutes per week.

## 2023-06-02 NOTE — Assessment & Plan Note (Signed)
Chronic, LDL goal is less than 70.  He will continue with atorvastatin 40mg  1/2 tab po qpm. He is encouraged to follow heart healthy lifestyle.

## 2023-08-02 ENCOUNTER — Encounter: Payer: Self-pay | Admitting: Internal Medicine

## 2023-08-02 ENCOUNTER — Ambulatory Visit: Payer: Medicare HMO | Admitting: Internal Medicine

## 2023-08-02 VITALS — BP 110/60 | HR 83 | Temp 98.5°F | Ht 68.0 in | Wt 206.8 lb

## 2023-08-02 DIAGNOSIS — Z6831 Body mass index (BMI) 31.0-31.9, adult: Secondary | ICD-10-CM

## 2023-08-02 DIAGNOSIS — N182 Chronic kidney disease, stage 2 (mild): Secondary | ICD-10-CM | POA: Diagnosis not present

## 2023-08-02 DIAGNOSIS — I129 Hypertensive chronic kidney disease with stage 1 through stage 4 chronic kidney disease, or unspecified chronic kidney disease: Secondary | ICD-10-CM

## 2023-08-02 DIAGNOSIS — E6609 Other obesity due to excess calories: Secondary | ICD-10-CM | POA: Diagnosis not present

## 2023-08-02 DIAGNOSIS — Z794 Long term (current) use of insulin: Secondary | ICD-10-CM | POA: Diagnosis not present

## 2023-08-02 DIAGNOSIS — E66811 Obesity, class 1: Secondary | ICD-10-CM | POA: Diagnosis not present

## 2023-08-02 DIAGNOSIS — E1122 Type 2 diabetes mellitus with diabetic chronic kidney disease: Secondary | ICD-10-CM | POA: Diagnosis not present

## 2023-08-02 DIAGNOSIS — Z2821 Immunization not carried out because of patient refusal: Secondary | ICD-10-CM | POA: Diagnosis not present

## 2023-08-02 MED ORDER — JANUMET 50-1000 MG PO TABS: 1.0000 | ORAL_TABLET | Freq: Two times a day (BID) | ORAL | 2 refills | Status: DC

## 2023-08-02 NOTE — Assessment & Plan Note (Signed)
Chronic, now on insulin. He will continue with Jardiance 25mg  daily, Lantus 36 units nightly and Janumet 50/1000mg  twice daily. HE is reminded to follow dietary recommendations - no sodas, sugary beverages. Chronic, he is encouraged to stay well hydrated, avoid NSAIDs and keep BP controlled to prevent progression of CKD.  He will f/u in four months for re-evaluation.

## 2023-08-02 NOTE — Assessment & Plan Note (Signed)
Chronic, well controlled. Goal BP<120/80.  He will continue with amlodipine 10mg  daily, hydrochlorothiazide 25mg  daily and lsinopril 40mg  daily. Encouraged to follow low sodium diet. He will f/u in March 2025.

## 2023-08-02 NOTE — Addendum Note (Signed)
Addended by: Argentina Ponder on: 08/02/2023 03:00 PM   Modules accepted: Orders

## 2023-08-02 NOTE — Assessment & Plan Note (Signed)
He is encouraged to initially strive for BMI less than 30 to decrease cardiac risk. He is advised to exercise no less than 150 minutes per week.

## 2023-08-02 NOTE — Progress Notes (Addendum)
I,Marcus Davis, CMA,acting as a Neurosurgeon for Marcus Aliment, MD.,have documented all relevant documentation on the behalf of Marcus Aliment, MD,as directed by  Marcus Aliment, MD while in the presence of Marcus Aliment, MD.  Subjective:  Patient ID: Marcus Davis , male    DOB: Jan 12, 1951 , 72 y.o.   MRN: [id number]  Chief Complaint  Patient presents with   Diabetes    HPI  Patient presents today for diabetes, bp & cholesterol check. Patient states compliance with medications.  Denies headache, chest pain, and SOB. Patient reports he is doing pretty good, his sugars have been no higher than 80. He states he has made some dietary changes.  He reports no specific questions or concerns. He was last seen in July 2024.  He is scheduled for colonoscopy 09/21/23.     Diabetes He presents for his follow-up diabetic visit. He has type 2 diabetes mellitus. There are no hypoglycemic associated symptoms. There are no diabetic associated symptoms. Pertinent negatives for diabetes include no blurred vision, no polydipsia, no polyphagia and no polyuria. There are no hypoglycemic complications. Risk factors for coronary artery disease include diabetes mellitus, dyslipidemia, hypertension, male sex and sedentary lifestyle. He is compliant with treatment most of the time. He is following a diabetic diet. He participates in exercise intermittently. Eye exam is current.  Hypertension This is a chronic problem. The current episode started more than 1 year ago. The problem has been gradually improving since onset. The problem is controlled. Pertinent negatives include no blurred vision or palpitations. The current treatment provides moderate improvement.     Past Medical History:  Diagnosis Date   Allergy    DM (diabetes mellitus) (HCC)    HTN (hypertension)    Hyperlipidemia      Family History  Problem Relation Age of Onset   Diabetes Mother    Hypertension Mother    Stroke Mother    Hypertension  Father    Diabetes Father    Stroke Paternal Grandfather      Current Outpatient Medications:    Alcohol Swabs (ALCOHOL WIPES) 70 % PADS, USE PAD TO AFFECTED AREA DAILY, Disp: , Rfl:    amLODipine (NORVASC) 10 MG tablet, TAKE ONE TABLET BY MOUTH DAILY FOR HEART, Disp: , Rfl:    aspirin EC 81 MG tablet, Take 81 mg by mouth daily., Disp: , Rfl:    atorvastatin (LIPITOR) 40 MG tablet, TAKE ONE-HALF TABLET BY MOUTH AT BEDTIME FOR CHOLESTEROL *REPLACES ROSUVASTATIN (CRESTOR)*, Disp: , Rfl:    empagliflozin (JARDIANCE) 25 MG TABS tablet, Take 1 tablet (25 mg total) by mouth daily before breakfast., Disp: 90 tablet, Rfl: 2   hydrochlorothiazide (HYDRODIURIL) 25 MG tablet, Take 25 mg by mouth daily. , Disp: , Rfl:    insulin glargine (LANTUS) 100 UNIT/ML injection, Inject 36 Units into the skin. 42 units sq nightly, Disp: , Rfl:    lisinopril (PRINIVIL,ZESTRIL) 40 MG tablet, Take 40 mg by mouth daily. , Disp: , Rfl:    Vitamin D, Cholecalciferol, 25 MCG (1000 UT) CAPS, Take by mouth. 1000 units Monday-Friday, 2000 units Saturday and Sunday, Disp: , Rfl:    sitaGLIPtin-metformin (JANUMET) 50-1000 MG tablet, Take 1 tablet by mouth 2 (two) times daily with a meal., Disp: 180 tablet, Rfl: 2   Allergies  Allergen Reactions   Pollen Extract Other (See Comments)    Sneezing, runny nose, congestion,      Review of Systems  Constitutional: Negative.   HENT: Negative.  Eyes:  Negative for blurred vision.  Respiratory: Negative.    Cardiovascular: Negative.  Negative for palpitations.  Gastrointestinal: Negative.   Endocrine: Negative for polydipsia, polyphagia and polyuria.  Musculoskeletal: Negative.   Skin: Negative.   Psychiatric/Behavioral: Negative.       Today's Vitals   08/02/23 1435  BP: 110/60  Pulse: 83  Temp: 98.5 F (36.9 C)  Weight: 206 lb 12.8 oz (93.8 kg)  Height: 5\' 8"  (1.727 m)  PainSc: 0-No pain   Body mass index is 31.44 kg/m.  Wt Readings from Last 3 Encounters:   08/02/23 206 lb 12.8 oz (93.8 kg)  05/25/23 209 lb 12.8 oz (95.2 kg)  05/25/23 209 lb (94.8 kg)    The ASCVD Risk score (Arnett DK, et al., 2019) failed to calculate for the following reasons:   The valid total cholesterol range is 130 to 320 mg/dL  Objective:  Physical Exam Vitals and nursing note reviewed.  Constitutional:      Appearance: Normal appearance.  HENT:     Head: Normocephalic and atraumatic.  Eyes:     Extraocular Movements: Extraocular movements intact.     Comments: Wears corrective lenses  Cardiovascular:     Rate and Rhythm: Normal rate and regular rhythm.     Heart sounds: Normal heart sounds.  Pulmonary:     Effort: Pulmonary effort is normal.     Breath sounds: Normal breath sounds.  Skin:    General: Skin is warm.  Neurological:     General: No focal deficit present.     Mental Status: He is alert.  Psychiatric:        Mood and Affect: Mood normal.         Assessment And Plan:  Type 2 diabetes mellitus with stage 2 chronic kidney disease, with long-term current use of insulin (HCC) Assessment & Plan: Chronic, now on insulin. He will continue with Jardiance 25mg  daily, Lantus 36 units nightly and Janumet 50/1000mg  twice daily. HE is reminded to follow dietary recommendations - no sodas, sugary beverages. Chronic, he is encouraged to stay well hydrated, avoid NSAIDs and keep BP controlled to prevent progression of CKD.  He will f/u in four months for re-evaluation.   Orders: -     Hemoglobin A1c  Parenchymal renal hypertension, stage 1 through stage 4 or unspecified chronic kidney disease Assessment & Plan: Chronic, well controlled. Goal BP<120/80.  He will continue with amlodipine 10mg  daily, hydrochlorothiazide 25mg  daily and lsinopril 40mg  daily. Encouraged to follow low sodium diet. He will f/u in March 2025.    Class 1 obesity due to excess calories with serious comorbidity and body mass index (BMI) of 31.0 to 31.9 in adult Assessment &  Plan: He is encouraged to initially strive for BMI less than 30 to decrease cardiac risk. He is advised to exercise no less than 150 minutes per week.     COVID-19 vaccination declined  Other orders -     Janumet; Take 1 tablet by mouth 2 (two) times daily with a meal.  Dispense: 180 tablet; Refill: 2    Return for Reschedule Jan to March DM F/U.  Patient was given opportunity to ask questions. Patient verbalized understanding of the plan and was able to repeat key elements of the plan. All questions were answered to their satisfaction.    I, Marcus Aliment, MD, have reviewed all documentation for this visit. The documentation on 08/02/23 for the exam, diagnosis, procedures, and orders are all accurate and complete.  IF YOU HAVE BEEN REFERRED TO A SPECIALIST, IT MAY TAKE 1-2 WEEKS TO SCHEDULE/PROCESS THE REFERRAL. IF YOU HAVE NOT HEARD FROM US/SPECIALIST IN TWO WEEKS, PLEASE GIVE [place] A CALL AT [phone].

## 2023-08-03 LAB — HEMOGLOBIN A1C
Est. average glucose Bld gHb Est-mCnc: 166 mg/dL
Hgb A1c MFr Bld: 7.4 % — ABNORMAL HIGH (ref 4.8–5.6)

## 2023-09-19 ENCOUNTER — Telehealth: Payer: Self-pay

## 2023-09-19 NOTE — Telephone Encounter (Signed)
 PAP: PAP application for Janumet  and Jardiance , ([employer] and [employer]) has been mailed to [employer] home address on file. Will fax provider portion of application to provider's office when pt's portion is received.    Please note the providers portion of the application has been faxed to Dr. Catheryn Slocumb office

## 2023-09-27 ENCOUNTER — Ambulatory Visit: Payer: Medicare HMO | Admitting: Internal Medicine

## 2023-09-28 LAB — HM DIABETES EYE EXAM

## 2023-10-13 NOTE — Telephone Encounter (Signed)
Closing patient encounter out, have made 2 attempts to outreach patient, and mailed application to patients home address on file.  Patient has not returned applications mailed to him on 09/19/2023

## 2023-11-24 ENCOUNTER — Ambulatory Visit: Payer: Medicare HMO | Admitting: Internal Medicine

## 2023-11-28 ENCOUNTER — Ambulatory Visit (INDEPENDENT_AMBULATORY_CARE_PROVIDER_SITE_OTHER): Admitting: Internal Medicine

## 2023-11-28 ENCOUNTER — Encounter: Payer: Self-pay | Admitting: Internal Medicine

## 2023-11-28 VITALS — BP 124/70 | HR 86 | Temp 98.6°F | Ht 68.0 in | Wt 210.8 lb

## 2023-11-28 DIAGNOSIS — Z794 Long term (current) use of insulin: Secondary | ICD-10-CM

## 2023-11-28 DIAGNOSIS — E1122 Type 2 diabetes mellitus with diabetic chronic kidney disease: Secondary | ICD-10-CM | POA: Diagnosis not present

## 2023-11-28 DIAGNOSIS — F4321 Adjustment disorder with depressed mood: Secondary | ICD-10-CM | POA: Insufficient documentation

## 2023-11-28 DIAGNOSIS — E66811 Obesity, class 1: Secondary | ICD-10-CM

## 2023-11-28 DIAGNOSIS — E6609 Other obesity due to excess calories: Secondary | ICD-10-CM

## 2023-11-28 DIAGNOSIS — Z6832 Body mass index (BMI) 32.0-32.9, adult: Secondary | ICD-10-CM | POA: Diagnosis not present

## 2023-11-28 DIAGNOSIS — E7800 Pure hypercholesterolemia, unspecified: Secondary | ICD-10-CM | POA: Diagnosis not present

## 2023-11-28 DIAGNOSIS — I129 Hypertensive chronic kidney disease with stage 1 through stage 4 chronic kidney disease, or unspecified chronic kidney disease: Secondary | ICD-10-CM | POA: Diagnosis not present

## 2023-11-28 DIAGNOSIS — N1831 Chronic kidney disease, stage 3a: Secondary | ICD-10-CM | POA: Diagnosis not present

## 2023-11-28 MED ORDER — EMPAGLIFLOZIN 25 MG PO TABS: 25.0000 mg | ORAL_TABLET | Freq: Every day | ORAL | 2 refills | Status: DC

## 2023-11-28 MED ORDER — JANUMET 50-1000 MG PO TABS: 1.0000 | ORAL_TABLET | Freq: Two times a day (BID) | ORAL | 2 refills | Status: DC

## 2023-11-28 NOTE — Assessment & Plan Note (Signed)
Chronic, he is encouraged to stay well hydrated, avoid NSAIDs and keep BP/BS controlled to prevent progression of CKD.

## 2023-11-28 NOTE — Assessment & Plan Note (Signed)
Chronic, LDL goal is less than 70.  He will continue with atorvastatin 40mg  1/2 tab po qpm. He is encouraged to follow heart healthy lifestyle.

## 2023-11-28 NOTE — Assessment & Plan Note (Addendum)
 He has gained 4 lbs since November 2024.  He is encouraged to initially strive for BMI less than 30 to decrease cardiac risk. He is advised to exercise no less than 150 minutes per week.

## 2023-11-28 NOTE — Patient Instructions (Signed)

## 2023-11-28 NOTE — Assessment & Plan Note (Signed)
 Chronic, well controlled. Goal BP<120/80.  He will continue with amlodipine 10mg  daily, hydrochlorothiazide 25mg  daily and lsinopril 40mg  daily. Encouraged to follow low sodium diet. He will f/u in March 2025.

## 2023-11-28 NOTE — Progress Notes (Signed)
 I,[name], CMA,acting as a Neurosurgeon for Marcus Aliment, MD.,have documented all relevant documentation on the behalf of Marcus Aliment, MD,as directed by  Marcus Aliment, MD while in the presence of Marcus Aliment, MD.  Subjective:  Patient ID: Marcus Davis , male    DOB: 08/09/1951 , 73 y.o.   MRN: [id number]  Chief Complaint  Patient presents with   Diabetes   Hypertension    HPI  Patient presents today for diabetes, bp & cholesterol check. Patient states compliance with medications.  Denies having any headache, chest pain, and SOB. He is currently is grieving the loss of his brother & nephew. He is taking it day by day. Still trying to process everything.     He states his sugars are typically 110-120s. The highest has been 160, this occurred during the stress of his brother's passing.  He died from metastatic pancreatic cancer.   Diabetes He presents for his follow-up diabetic visit. He has type 2 diabetes mellitus. There are no hypoglycemic associated symptoms. There are no diabetic associated symptoms. Pertinent negatives for diabetes include no blurred vision, no polydipsia, no polyphagia and no polyuria. There are no hypoglycemic complications. Risk factors for coronary artery disease include diabetes mellitus, dyslipidemia, hypertension, male sex and sedentary lifestyle. He is compliant with treatment most of the time. He is following a diabetic diet. He participates in exercise intermittently. Eye exam is current.  Hypertension This is a chronic problem. The current episode started more than 1 year ago. The problem has been gradually improving since onset. The problem is controlled. Pertinent negatives include no blurred vision or palpitations. The current treatment provides moderate improvement.     Past Medical History:  Diagnosis Date   Allergy    DM (diabetes mellitus) (HCC)    HTN (hypertension)    Hyperlipidemia      Family History  Problem Relation Age of  Onset   Diabetes Mother    Hypertension Mother    Stroke Mother    Hypertension Father    Diabetes Father    Stroke Paternal Grandfather      Current Outpatient Medications:    Alcohol Swabs (ALCOHOL WIPES) 70 % PADS, USE PAD TO AFFECTED AREA DAILY, Disp: , Rfl:    amLODipine (NORVASC) 10 MG tablet, TAKE ONE TABLET BY MOUTH DAILY FOR HEART, Disp: , Rfl:    aspirin EC 81 MG tablet, Take 81 mg by mouth daily., Disp: , Rfl:    atorvastatin (LIPITOR) 40 MG tablet, TAKE ONE-HALF TABLET BY MOUTH AT BEDTIME FOR CHOLESTEROL *REPLACES ROSUVASTATIN (CRESTOR)*, Disp: , Rfl:    hydrochlorothiazide (HYDRODIURIL) 25 MG tablet, Take 25 mg by mouth daily. , Disp: , Rfl:    insulin glargine (LANTUS) 100 UNIT/ML injection, Inject 36 Units into the skin. 42 units sq nightly, Disp: , Rfl:    lisinopril (PRINIVIL,ZESTRIL) 40 MG tablet, Take 40 mg by mouth daily. , Disp: , Rfl:    Vitamin D, Cholecalciferol, 25 MCG (1000 UT) CAPS, Take by mouth. 1000 units Monday-Friday, 2000 units Saturday and Sunday, Disp: , Rfl:    empagliflozin (JARDIANCE) 25 MG TABS tablet, Take 1 tablet (25 mg total) by mouth daily before breakfast., Disp: 90 tablet, Rfl: 2   sitaGLIPtin-metformin (JANUMET) 50-1000 MG tablet, Take 1 tablet by mouth 2 (two) times daily with a meal., Disp: 180 tablet, Rfl: 2   Allergies  Allergen Reactions   Pollen Extract Other (See Comments)    Sneezing, runny nose, congestion,  Review of Systems  Constitutional: Negative.   HENT: Negative.    Eyes:  Negative for blurred vision.  Cardiovascular: Negative.  Negative for palpitations.  Endocrine: Negative for polydipsia, polyphagia and polyuria.  Genitourinary: Negative.   Skin: Negative.   Allergic/Immunologic: Negative.   Neurological: Negative.   Hematological: Negative.      Today's Vitals   11/28/23 1415  BP: 124/70  Pulse: 86  Temp: 98.6 F (37 C)  SpO2: 98%  Weight: 210 lb 12.8 oz (95.6 kg)  Height: 5\' 8"  (1.727 m)   Body  mass index is 32.05 kg/m.  Wt Readings from Last 3 Encounters:  11/28/23 210 lb 12.8 oz (95.6 kg)  08/02/23 206 lb 12.8 oz (93.8 kg)  05/25/23 209 lb 12.8 oz (95.2 kg)     Objective:  Physical Exam Vitals and nursing note reviewed.  Constitutional:      Appearance: Normal appearance.  HENT:     Nose:     Comments: Masked     Mouth/Throat:     Comments: Masked  Eyes:     Extraocular Movements: Extraocular movements intact.  Cardiovascular:     Rate and Rhythm: Normal rate and regular rhythm.     Heart sounds: Normal heart sounds.  Pulmonary:     Effort: Pulmonary effort is normal.     Breath sounds: Normal breath sounds.  Musculoskeletal:     Cervical back: Normal range of motion.  Skin:    General: Skin is warm.  Neurological:     General: No focal deficit present.     Mental Status: He is alert.  Psychiatric:        Mood and Affect: Mood normal.         Assessment And Plan:  Type 2 diabetes mellitus with stage 3a chronic kidney disease, with long-term current use of insulin (HCC) Assessment & Plan: Chronic, now on insulin. He will continue with Jardiance 25mg  daily, Lantus 42 units nightly and Janumet 50/1000mg  twice daily. He is reminded to follow dietary recommendations - no sodas, sugary beverages. Chronic, he is encouraged to stay well hydrated, avoid NSAIDs and keep BP controlled to prevent progression of CKD.  He will f/u in four months for re-evaluation.   Orders: -     CMP14+EGFR -     Hemoglobin A1c -     CBC -     PTH, intact and calcium -     Phosphorus -     Protein electrophoresis, serum  Parenchymal renal hypertension, stage 1 through stage 4 or unspecified chronic kidney disease Assessment & Plan: Chronic, well controlled. Goal BP<120/80.  He will continue with amlodipine 10mg  daily, hydrochlorothiazide 25mg  daily and lsinopril 40mg  daily. Encouraged to follow low sodium diet. He will f/u in March 2025.    Pure hypercholesterolemia Assessment  & Plan: Chronic, LDL goal is less than 70.  He will continue with atorvastatin 40mg  1/2 tab po qpm. He is encouraged to follow heart healthy lifestyle.    Class 1 obesity due to excess calories with serious comorbidity and body mass index (BMI) of 32.0 to 32.9 in adult Assessment & Plan: He has gained 4 lbs since November 2024.  He is encouraged to initially strive for BMI less than 30 to decrease cardiac risk. He is advised to exercise no less than 150 minutes per week.     Grief Assessment & Plan: He plans to pursue grief counseling through the [place].     Other orders -  Empagliflozin; Take 1 tablet (25 mg total) by mouth daily before breakfast.  Dispense: 90 tablet; Refill: 2 -     Janumet; Take 1 tablet by mouth 2 (two) times daily with a meal.  Dispense: 180 tablet; Refill: 2  She is encouraged to strive for BMI less than 30 to decrease cardiac risk. Advised to aim for at least 150 minutes of exercise per week.    Return in 3 months (on 02/28/2024), or physical exam.  Patient was given opportunity to ask questions. Patient verbalized understanding of the plan and was able to repeat key elements of the plan. All questions were answered to their satisfaction.    I, Marcus Aliment, MD, have reviewed all documentation for this visit. The documentation on 11/28/23 for the exam, diagnosis, procedures, and orders are all accurate and complete.   IF YOU HAVE BEEN REFERRED TO A SPECIALIST, IT MAY TAKE 1-2 WEEKS TO SCHEDULE/PROCESS THE REFERRAL. IF YOU HAVE NOT HEARD FROM US/SPECIALIST IN TWO WEEKS, PLEASE GIVE [place] A CALL AT [phone].   THE PATIENT IS ENCOURAGED TO PRACTICE SOCIAL DISTANCING DUE TO THE COVID-19 PANDEMIC.

## 2023-11-28 NOTE — Assessment & Plan Note (Signed)
 He plans to pursue grief counseling through the [place].

## 2023-11-28 NOTE — Assessment & Plan Note (Signed)
 Chronic, now on insulin. He will continue with Jardiance 25mg  daily, Lantus 42 units nightly and Janumet 50/1000mg  twice daily. He is reminded to follow dietary recommendations - no sodas, sugary beverages. Chronic, he is encouraged to stay well hydrated, avoid NSAIDs and keep BP controlled to prevent progression of CKD.  He will f/u in four months for re-evaluation.

## 2023-11-29 LAB — CMP14+EGFR
ALT: 18 IU/L (ref 0–44)
AST: 17 IU/L (ref 0–40)
Albumin: 4.4 g/dL (ref 3.9–4.9)
Alkaline Phosphatase: 94 IU/L (ref 44–121)
BUN/Creatinine Ratio: 8 — ABNORMAL LOW (ref 10–24)
BUN: 11 mg/dL (ref 8–27)
Bilirubin Total: 0.4 mg/dL (ref 0.0–1.2)
CO2: 24 mmol/L (ref 20–29)
Calcium: 10 mg/dL (ref 8.6–10.2)
Chloride: 98 mmol/L (ref 96–106)
Creatinine, Ser: 1.37 mg/dL — ABNORMAL HIGH (ref 0.76–1.27)
Globulin, Total: 2.6 g/dL (ref 1.5–4.5)
Glucose: 171 mg/dL — ABNORMAL HIGH (ref 70–99)
Potassium: 4.5 mmol/L (ref 3.5–5.2)
Sodium: 138 mmol/L (ref 134–144)
Total Protein: 7 g/dL (ref 6.0–8.5)
eGFR: 55 mL/min/{1.73_m2} — ABNORMAL LOW (ref >59)

## 2023-11-29 LAB — PROTEIN ELECTROPHORESIS, SERUM
A/G Ratio: 1.1 (ref 0.7–1.7)
Albumin ELP: 3.7 g/dL (ref 2.9–4.4)
Alpha 1: 0.2 g/dL (ref 0.0–0.4)
Alpha 2: 1 g/dL (ref 0.4–1.0)
Beta: 1.1 g/dL (ref 0.7–1.3)
Gamma Globulin: 1.1 g/dL (ref 0.4–1.8)
Globulin, Total: 3.3 g/dL (ref 2.2–3.9)

## 2023-11-29 LAB — CBC
Hematocrit: 42.3 % (ref 37.5–51.0)
Hemoglobin: 14.4 g/dL (ref 13.0–17.7)
MCH: 30.7 pg (ref 26.6–33.0)
MCHC: 34 g/dL (ref 31.5–35.7)
MCV: 90 fL (ref 79–97)
Platelets: 248 10*3/uL (ref 150–450)
RBC: 4.69 x10E6/uL (ref 4.14–5.80)
RDW: 13 % (ref 11.6–15.4)
WBC: 6.1 10*3/uL (ref 3.4–10.8)

## 2023-11-29 LAB — HEMOGLOBIN A1C
Est. average glucose Bld gHb Est-mCnc: 189 mg/dL
Hgb A1c MFr Bld: 8.2 % — ABNORMAL HIGH (ref 4.8–5.6)

## 2023-11-29 LAB — PTH, INTACT AND CALCIUM: PTH: 23 pg/mL (ref 15–65)

## 2023-11-29 LAB — PHOSPHORUS: Phosphorus: 4.3 mg/dL — ABNORMAL HIGH (ref 2.8–4.1)

## 2023-12-22 ENCOUNTER — Telehealth: Payer: Self-pay

## 2023-12-22 NOTE — Telephone Encounter (Signed)
 Patient was identified as falling into the [place] Measure - Diabetes.   Patient was: Appointment scheduled for lab or office visit for A1c.

## 2024-03-21 ENCOUNTER — Encounter: Admitting: Internal Medicine

## 2024-04-09 ENCOUNTER — Encounter: Admitting: Internal Medicine

## 2024-04-12 ENCOUNTER — Ambulatory Visit (INDEPENDENT_AMBULATORY_CARE_PROVIDER_SITE_OTHER): Admitting: Internal Medicine

## 2024-04-12 ENCOUNTER — Encounter: Payer: Self-pay | Admitting: Internal Medicine

## 2024-04-12 VITALS — BP 110/82 | HR 83 | Temp 98.9°F | Ht 68.0 in | Wt 210.4 lb

## 2024-04-12 DIAGNOSIS — Z0000 Encounter for general adult medical examination without abnormal findings: Secondary | ICD-10-CM | POA: Diagnosis not present

## 2024-04-12 DIAGNOSIS — N1831 Chronic kidney disease, stage 3a: Secondary | ICD-10-CM | POA: Diagnosis not present

## 2024-04-12 DIAGNOSIS — Z794 Long term (current) use of insulin: Secondary | ICD-10-CM | POA: Diagnosis not present

## 2024-04-12 DIAGNOSIS — R0989 Other specified symptoms and signs involving the circulatory and respiratory systems: Secondary | ICD-10-CM | POA: Diagnosis not present

## 2024-04-12 DIAGNOSIS — Z6831 Body mass index (BMI) 31.0-31.9, adult: Secondary | ICD-10-CM

## 2024-04-12 DIAGNOSIS — R351 Nocturia: Secondary | ICD-10-CM | POA: Diagnosis not present

## 2024-04-12 DIAGNOSIS — I129 Hypertensive chronic kidney disease with stage 1 through stage 4 chronic kidney disease, or unspecified chronic kidney disease: Secondary | ICD-10-CM | POA: Diagnosis not present

## 2024-04-12 DIAGNOSIS — E1122 Type 2 diabetes mellitus with diabetic chronic kidney disease: Secondary | ICD-10-CM | POA: Diagnosis not present

## 2024-04-12 DIAGNOSIS — H6121 Impacted cerumen, right ear: Secondary | ICD-10-CM

## 2024-04-12 DIAGNOSIS — E6609 Other obesity due to excess calories: Secondary | ICD-10-CM

## 2024-04-12 DIAGNOSIS — E66811 Obesity, class 1: Secondary | ICD-10-CM

## 2024-04-12 DIAGNOSIS — E7800 Pure hypercholesterolemia, unspecified: Secondary | ICD-10-CM

## 2024-04-12 LAB — POCT URINALYSIS DIP (CLINITEK)
Bilirubin, UA: NEGATIVE
Blood, UA: NEGATIVE
Glucose, UA: 500 mg/dL — AB
Ketones, POC UA: NEGATIVE mg/dL
Leukocytes, UA: NEGATIVE
Nitrite, UA: NEGATIVE
POC PROTEIN,UA: NEGATIVE
Spec Grav, UA: 1.02 (ref 1.010–1.025)
Urobilinogen, UA: 0.2 U/dL
pH, UA: 5.5 (ref 5.0–8.0)

## 2024-04-12 NOTE — Patient Instructions (Signed)
 Health Maintenance, Male  Adopting a healthy lifestyle and getting preventive care are important in promoting health and wellness. Ask your health care provider about:  The right schedule for you to have regular tests and exams.  Things you can do on your own to prevent diseases and keep yourself healthy.  What should I know about diet, weight, and exercise?  Eat a healthy diet    Eat a diet that includes plenty of vegetables, fruits, low-fat dairy products, and lean protein.  Do not eat a lot of foods that are high in solid fats, added sugars, or sodium.  Maintain a healthy weight  Body mass index (BMI) is a measurement that can be used to identify possible weight problems. It estimates body fat based on height and weight. Your health care provider can help determine your BMI and help you achieve or maintain a healthy weight.  Get regular exercise  Get regular exercise. This is one of the most important things you can do for your health. Most adults should:  Exercise for at least 150 minutes each week. The exercise should increase your heart rate and make you sweat (moderate-intensity exercise).  Do strengthening exercises at least twice a week. This is in addition to the moderate-intensity exercise.  Spend less time sitting. Even light physical activity can be beneficial.  Watch cholesterol and blood lipids  Have your blood tested for lipids and cholesterol at 71 years of age, then have this test every 5 years.  You may need to have your cholesterol levels checked more often if:  Your lipid or cholesterol levels are high.  You are older than 71 years of age.  You are at high risk for heart disease.  What should I know about cancer screening?  Many types of cancers can be detected early and may often be prevented. Depending on your health history and family history, you may need to have cancer screening at various ages. This may include screening for:  Colorectal cancer.  Prostate cancer.  Skin cancer.  Lung  cancer.  What should I know about heart disease, diabetes, and high blood pressure?  Blood pressure and heart disease  High blood pressure causes heart disease and increases the risk of stroke. This is more likely to develop in people who have high blood pressure readings or are overweight.  Talk with your health care provider about your target blood pressure readings.  Have your blood pressure checked:  Every 3-5 years if you are 9-95 years of age.  Every year if you are 85 years old or older.  If you are between the ages of 29 and 29 and are a current or former smoker, ask your health care provider if you should have a one-time screening for abdominal aortic aneurysm (AAA).  Diabetes  Have regular diabetes screenings. This checks your fasting blood sugar level. Have the screening done:  Once every three years after age 23 if you are at a normal weight and have a low risk for diabetes.  More often and at a younger age if you are overweight or have a high risk for diabetes.  What should I know about preventing infection?  Hepatitis B  If you have a higher risk for hepatitis B, you should be screened for this virus. Talk with your health care provider to find out if you are at risk for hepatitis B infection.  Hepatitis C  Blood testing is recommended for:  Everyone born from [birth year] through 1963.  Anyone  with known risk factors for hepatitis C.  Sexually transmitted infections (STIs)  You should be screened each year for STIs, including gonorrhea and chlamydia, if:  You are sexually active and are younger than 71 years of age.  You are older than 71 years of age and your health care provider tells you that you are at risk for this type of infection.  Your sexual activity has changed since you were last screened, and you are at increased risk for chlamydia or gonorrhea. Ask your health care provider if you are at risk.  Ask your health care provider about whether you are at high risk for HIV. Your health care provider  may recommend a prescription medicine to help prevent HIV infection. If you choose to take medicine to prevent HIV, you should first get tested for HIV. You should then be tested every 3 months for as long as you are taking the medicine.  Follow these instructions at home:  Alcohol use  Do not drink alcohol if your health care provider tells you not to drink.  If you drink alcohol:  Limit how much you have to 0-2 drinks a day.  Know how much alcohol is in your drink. In the [place] one drink equals one 12 oz bottle of beer (355 mL), one 5 oz glass of wine (148 mL), or one 1 oz glass of hard liquor (44 mL).  Lifestyle  Do not use any products that contain nicotine or tobacco. These products include cigarettes, chewing tobacco, and vaping devices, such as e-cigarettes. If you need help quitting, ask your health care provider.  Do not use street drugs.  Do not share needles.  Ask your health care provider for help if you need support or information about quitting drugs.  General instructions  Schedule regular health, dental, and eye exams.  Stay current with your vaccines.  Tell your health care provider if:  You often feel depressed.  You have ever been abused or do not feel safe at home.  Summary  Adopting a healthy lifestyle and getting preventive care are important in promoting health and wellness.  Follow your health care provider's instructions about healthy diet, exercising, and getting tested or screened for diseases.  Follow your health care provider's instructions on monitoring your cholesterol and blood pressure.  This information is not intended to replace advice given to you by your health care provider. Make sure you discuss any questions you have with your health care provider.  Document Revised: 01/19/2021 Document Reviewed: 01/19/2021  Elsevier Patient Education  2024 [employer].

## 2024-04-12 NOTE — Progress Notes (Signed)
 I,Victoria T Emmitt, CMA,acting as a Neurosurgeon for Catheryn LOISE Slocumb, MD.,have documented all relevant documentation on the behalf of Catheryn LOISE Slocumb, MD,as directed by  Catheryn LOISE Slocumb, MD while in the presence of Catheryn LOISE Slocumb, MD.  Subjective:   Patient ID: Marcus Davis , male    DOB: 31-Oct-1950 , 73 y.o.   MRN: [id number]  Chief Complaint  Patient presents with   Annual Exam    Patient presents today for annual exam. He reports compliance with medications. Denies headache, chest pain & sob.    Diabetes   Hypertension   Hyperlipidemia    HPI Discussed the use of AI scribe software for clinical note transcription with the patient, who gave verbal consent to proceed.  History of Present Illness Marcus Davis is a 73 year old male with diabetes who presents for a physical and diabetes check.  He is also followed by the [facility].   He is currently on Jardiance , Janumet  (taken twice daily at 3 AM and 7 PM), and Lantus at 44 units with no recent changes in his medication regimen. He takes his medications at 3 AM when he wakes up to use the restroom. His diet includes mostly fruit for breakfast, with occasional eggs and bacon. No issues with bowel movements and he gets up once at night to use the restroom. He is unsure if prostate evaluations are being done at the [facility]. He visits a podiatrist at the [facility] for foot care and reports some sensation issues during the foot exam.  He is on amlodipine, aspirin, atorvastatin, hydrochlorothiazide, and lisinopril 40 mg for blood pressure and kidney protection. He engages in a lot of exercise.  He mentions a recent loss of his brother to cancer and his nephew, which has been emotionally challenging. He reports occasional alcohol consumption during holidays and birthdays.  He is scheduled for a cyst removal at [facility] by a hand specialist on August 11. He also reports issues with earwax buildup in his right ear, which has not been addressed at the  [facility].   Diabetes He presents for his follow-up diabetic visit. He has type 2 diabetes mellitus. There are no hypoglycemic associated symptoms. There are no diabetic associated symptoms. Pertinent negatives for diabetes include no blurred vision, no polydipsia, no polyphagia and no polyuria. There are no hypoglycemic complications. Risk factors for coronary artery disease include diabetes mellitus, dyslipidemia, hypertension, male sex and sedentary lifestyle. He is compliant with treatment most of the time. He is following a diabetic diet. He participates in exercise intermittently. Eye exam is current.  Hypertension This is a chronic problem. The current episode started more than 1 year ago. The problem has been gradually improving since onset. The problem is controlled. Pertinent negatives include no blurred vision or palpitations. The current treatment provides moderate improvement.     Past Medical History:  Diagnosis Date   Allergy    DM (diabetes mellitus) (HCC)    HTN (hypertension)    Hyperlipidemia      Family History  Problem Relation Age of Onset   Diabetes Mother    Hypertension Mother    Stroke Mother    Hypertension Father    Diabetes Father    Stroke Paternal Grandfather      Current Outpatient Medications:    Alcohol Swabs (ALCOHOL WIPES) 70 % PADS, USE PAD TO AFFECTED AREA DAILY, Disp: , Rfl:    amLODipine (NORVASC) 10 MG tablet, TAKE ONE TABLET BY MOUTH DAILY FOR HEART, Disp: , Rfl:  aspirin EC 81 MG tablet, Take 81 mg by mouth daily., Disp: , Rfl:    atorvastatin (LIPITOR) 40 MG tablet, TAKE ONE-HALF TABLET BY MOUTH AT BEDTIME FOR CHOLESTEROL *REPLACES ROSUVASTATIN (CRESTOR)*, Disp: , Rfl:    empagliflozin  (JARDIANCE ) 25 MG TABS tablet, Take 1 tablet (25 mg total) by mouth daily before breakfast., Disp: 90 tablet, Rfl: 2   hydrochlorothiazide (HYDRODIURIL) 25 MG tablet, Take 25 mg by mouth daily. , Disp: , Rfl:    insulin glargine (LANTUS) 100 UNIT/ML injection,  Inject 44 Units into the skin at bedtime. 42 units sq nightly, Disp: , Rfl:    lisinopril (PRINIVIL,ZESTRIL) 40 MG tablet, Take 40 mg by mouth daily. , Disp: , Rfl:    sitaGLIPtin -metformin  (JANUMET ) 50-1000 MG tablet, Take 1 tablet by mouth 2 (two) times daily with a meal., Disp: 180 tablet, Rfl: 2   Vitamin D, Cholecalciferol, 25 MCG (1000 UT) CAPS, Take by mouth. 1000 units Monday-Friday, 2000 units Saturday and Sunday, Disp: , Rfl:    Allergies  Allergen Reactions   Pollen Extract Other (See Comments)    Sneezing, runny nose, congestion,      Men's preventive visit. Patient Health Questionnaire (PHQ-2) is  Flowsheet Row Office Visit from 08/02/2023 in [facility]  PHQ-2 Total Score 0  . Patient is on a regular diet. Marital status: Widowed. Relevant history for alcohol use is:  Social History   Substance and Sexual Activity  Alcohol Use Not Currently   Comment: occasionally  . Relevant history for tobacco use is:  Social History   Tobacco Use  Smoking Status Never  Smokeless Tobacco Never  .   Review of Systems  Constitutional: Negative.   Eyes:  Negative for blurred vision.  Respiratory: Negative.    Cardiovascular: Negative.  Negative for palpitations.  Endocrine: Negative.  Negative for polydipsia, polyphagia and polyuria.  Skin: Negative.   Allergic/Immunologic: Negative.   Hematological: Negative.      Today's Vitals   04/12/24 1101  BP: 110/82  Pulse: 83  Temp: 98.9 F (37.2 C)  SpO2: 98%  Weight: 210 lb 6.4 oz (95.4 kg)  Height: 5' 8 (1.727 m)   Body mass index is 31.99 kg/m.  Wt Readings from Last 3 Encounters:  04/12/24 210 lb 6.4 oz (95.4 kg)  11/28/23 210 lb 12.8 oz (95.6 kg)  08/02/23 206 lb 12.8 oz (93.8 kg)    Objective:  Physical Exam Vitals and nursing note reviewed.  Constitutional:      Appearance: Normal appearance. He is obese.  HENT:     Head: Normocephalic and atraumatic.     Right Ear: Ear  canal and external ear normal. There is impacted cerumen.     Left Ear: Tympanic membrane, ear canal and external ear normal.     Nose:     Comments: Masked     Mouth/Throat:     Pharynx: No oropharyngeal exudate or posterior oropharyngeal erythema.  Eyes:     Extraocular Movements: Extraocular movements intact.  Cardiovascular:     Rate and Rhythm: Normal rate and regular rhythm.     Pulses:          Dorsalis pedis pulses are 1+ on the right side and 1+ on the left side.     Heart sounds: Normal heart sounds.  Pulmonary:     Effort: Pulmonary effort is normal.     Breath sounds: Normal breath sounds.  Abdominal:     General: Bowel sounds are normal.  Palpations: Abdomen is soft.  Musculoskeletal:     Cervical back: Normal range of motion.  Feet:     Right foot:     Protective Sensation: 5 sites tested.  3 sites sensed.     Skin integrity: Dry skin present.     Toenail Condition: Right toenails are abnormally thick.     Left foot:     Protective Sensation: 5 sites tested.  3 sites sensed.     Skin integrity: Dry skin present.     Toenail Condition: Left toenails are abnormally thick.  Skin:    General: Skin is warm.  Neurological:     General: No focal deficit present.     Mental Status: He is alert.  Psychiatric:        Mood and Affect: Mood normal.       Assessment And Plan:    Encounter for annual health examination Assessment & Plan: A full exam was performed.  DRE deferred.  He is advised to get 30-45 minutes of regular exercise, no less than four to five days per week. Both weight-bearing and aerobic exercises are recommended.  He is advised to follow a healthy diet with at least six fruits/veggies per day, decrease intake of red meat and other saturated fats and to increase fish intake to twice weekly.  Meats/fish should not be fried -- baked, boiled or broiled is preferable. It is also important to cut back on your sugar intake.  Be sure to read labels - try to  avoid anything with added sugar, high fructose corn syrup or other sweeteners.  If you must use a sweetener, you can try stevia or monkfruit.  It is also important to avoid artificially sweetened foods/beverages and diet drinks. Lastly, wear SPF 50 sunscreen on exposed skin and when in direct sunlight for an extended period of time.  Be sure to avoid fast food restaurants and aim for at least 60 ounces of water daily.    - Request immunization history, colonoscopy records, and eye exam records from [facility].    Type 2 diabetes mellitus with stage 3a chronic kidney disease, with long-term current use of insulin (HCC) Assessment & Plan: Chronic, diabetic foot exam was performed. Type 2 diabetes mellitus with Janumet , Jardiance , and Lantus. - Continue Janumet , Jardiance , and Lantus as prescribed. - Schedule next four-month diabetes check. - I DISCUSSED WITH THE PATIENT AT LENGTH REGARDING THE GOALS OF GLYCEMIC CONTROL AND POSSIBLE LONG-TERM COMPLICATIONS.  I  ALSO STRESSED THE IMPORTANCE OF COMPLIANCE WITH HOME GLUCOSE MONITORING, DIETARY RESTRICTIONS INCLUDING AVOIDANCE OF SUGARY DRINKS/PROCESSED FOODS,  ALONG WITH REGULAR EXERCISE.  I  ALSO STRESSED THE IMPORTANCE OF ANNUAL EYE EXAMS, SELF FOOT CARE AND COMPLIANCE WITH OFFICE VISITS.   Orders: -     CMP14+EGFR -     Lipid panel -     Hemoglobin A1c -     CBC -     POCT URINALYSIS DIP (CLINITEK) -     Microalbumin / creatinine urine ratio -     EKG 12-Lead  Parenchymal renal hypertension, stage 1 through stage 4 or unspecified chronic kidney disease Assessment & Plan: Chronic, hypertension well-managed with amlodipine, lisinopril, and hydrochlorothiazide.  There is slight diastolic elevation noted today. He is encouraged to incorporate more exercise into his daily routine. EKG performed, NSR w/o acute changes. - Continue amlodipine, lisinopril, and hydrochlorothiazide as prescribed. - Follow low sodium diet.   Orders: -     CMP14+EGFR -      Lipid  panel -     POCT URINALYSIS DIP (CLINITEK) -     Microalbumin / creatinine urine ratio -     EKG 12-Lead  Pure hypercholesterolemia Assessment & Plan: Chronic, LDL goal is less than 70.  He will continue with atorvastatin 40mg  1/2 tab po qpm. He is encouraged to follow heart healthy lifestyle.   Orders: -     CMP14+EGFR -     Lipid panel  Right ear impacted cerumen Assessment & Plan: AFTER OBTAINING VERBAL CONSENT, RIGHT EAR WAS FLUSHED BY IRRIGATION. SHE TOLERATED PROCEDURE WELL WITHOUT ANY COMPLICATIONS. NO TM ABNORMALITIES WERE NOTED.   Orders: -     Ear Lavage  Decreased dorsalis pedis pulse -     VAS US  ABI WITH/WO TBI; Future  Nocturia -     PSA, total and free  Class 1 obesity due to excess calories with serious comorbidity and body mass index (BMI) of 31.0 to 31.9 in adult Assessment & Plan: He is encouraged to initially strive for BMI less than 30 to decrease cardiac risk. He is advised to exercise no less than 150 minutes per week.      Return for 1 year HM, 4 MONTH DM F/U.SABRA Patient was given opportunity to ask questions. Patient verbalized understanding of the plan and was able to repeat key elements of the plan. All questions were answered to their satisfaction.   I, Catheryn LOISE Slocumb, MD, have reviewed all documentation for this visit. The documentation on 04/12/24 for the exam, diagnosis, procedures, and orders are all accurate and complete.

## 2024-04-15 DIAGNOSIS — H6121 Impacted cerumen, right ear: Secondary | ICD-10-CM | POA: Insufficient documentation

## 2024-04-15 DIAGNOSIS — R0989 Other specified symptoms and signs involving the circulatory and respiratory systems: Secondary | ICD-10-CM | POA: Insufficient documentation

## 2024-04-15 NOTE — Assessment & Plan Note (Signed)
AFTER OBTAINING VERBAL CONSENT, RIGHT EAR WAS FLUSHED BY IRRIGATION. SHE TOLERATED PROCEDURE WELL WITHOUT ANY COMPLICATIONS. NO TM ABNORMALITIES WERE NOTED.

## 2024-04-15 NOTE — Assessment & Plan Note (Signed)
 He is encouraged to initially strive for BMI less than 30 to decrease cardiac risk. He is advised to exercise no less than 150 minutes per week.

## 2024-04-15 NOTE — Assessment & Plan Note (Signed)
Chronic, LDL goal is less than 70.  He will continue with atorvastatin 40mg  1/2 tab po qpm. He is encouraged to follow heart healthy lifestyle.

## 2024-04-15 NOTE — Assessment & Plan Note (Addendum)
 A full exam was performed.  DRE deferred.  He is advised to get 30-45 minutes of regular exercise, no less than four to five days per week. Both weight-bearing and aerobic exercises are recommended.  He is advised to follow a healthy diet with at least six fruits/veggies per day, decrease intake of red meat and other saturated fats and to increase fish intake to twice weekly.  Meats/fish should not be fried -- baked, boiled or broiled is preferable. It is also important to cut back on your sugar intake.  Be sure to read labels - try to avoid anything with added sugar, high fructose corn syrup or other sweeteners.  If you must use a sweetener, you can try stevia or monkfruit.  It is also important to avoid artificially sweetened foods/beverages and diet drinks. Lastly, wear SPF 50 sunscreen on exposed skin and when in direct sunlight for an extended period of time.  Be sure to avoid fast food restaurants and aim for at least 60 ounces of water daily.    - Request immunization history, colonoscopy records, and eye exam records from [place].

## 2024-04-15 NOTE — Assessment & Plan Note (Signed)
 Chronic, diabetic foot exam was performed. Type 2 diabetes mellitus with Janumet , Jardiance , and Lantus. - Continue Janumet , Jardiance , and Lantus as prescribed. - Schedule next four-month diabetes check. - I DISCUSSED WITH THE PATIENT AT LENGTH REGARDING THE GOALS OF GLYCEMIC CONTROL AND POSSIBLE LONG-TERM COMPLICATIONS.  I  ALSO STRESSED THE IMPORTANCE OF COMPLIANCE WITH HOME GLUCOSE MONITORING, DIETARY RESTRICTIONS INCLUDING AVOIDANCE OF SUGARY DRINKS/PROCESSED FOODS,  ALONG WITH REGULAR EXERCISE.  I  ALSO STRESSED THE IMPORTANCE OF ANNUAL EYE EXAMS, SELF FOOT CARE AND COMPLIANCE WITH OFFICE VISITS.

## 2024-04-15 NOTE — Assessment & Plan Note (Addendum)
 Chronic, hypertension well-managed with amlodipine, lisinopril, and hydrochlorothiazide.  There is slight diastolic elevation noted today. He is encouraged to incorporate more exercise into his daily routine. EKG performed, NSR w/o acute changes. - Continue amlodipine, lisinopril, and hydrochlorothiazide as prescribed. - Follow low sodium diet.

## 2024-04-16 ENCOUNTER — Ambulatory Visit: Payer: Self-pay | Admitting: Internal Medicine

## 2024-04-16 LAB — LIPID PANEL
Chol/HDL Ratio: 3.7 ratio (ref 0.0–5.0)
Cholesterol, Total: 116 mg/dL (ref 100–199)
HDL: 31 mg/dL — ABNORMAL LOW (ref >39)
LDL Chol Calc (NIH): 55 mg/dL (ref 0–99)
Triglycerides: 179 mg/dL — ABNORMAL HIGH (ref 0–149)
VLDL Cholesterol Cal: 30 mg/dL (ref 5–40)

## 2024-04-16 LAB — CMP14+EGFR
ALT: 16 IU/L (ref 0–44)
AST: 14 IU/L (ref 0–40)
Albumin: 4.5 g/dL (ref 3.8–4.8)
Alkaline Phosphatase: 94 IU/L (ref 44–121)
BUN/Creatinine Ratio: 10 (ref 10–24)
BUN: 12 mg/dL (ref 8–27)
Bilirubin Total: 0.5 mg/dL (ref 0.0–1.2)
CO2: 22 mmol/L (ref 20–29)
Calcium: 9.6 mg/dL (ref 8.6–10.2)
Chloride: 100 mmol/L (ref 96–106)
Creatinine, Ser: 1.24 mg/dL (ref 0.76–1.27)
Globulin, Total: 2.8 g/dL (ref 1.5–4.5)
Glucose: 90 mg/dL (ref 70–99)
Potassium: 4.6 mmol/L (ref 3.5–5.2)
Sodium: 140 mmol/L (ref 134–144)
Total Protein: 7.3 g/dL (ref 6.0–8.5)
eGFR: 62 mL/min/1.73 (ref >59)

## 2024-04-16 LAB — MICROALBUMIN / CREATININE URINE RATIO
Creatinine, Urine: 81.9 mg/dL
Microalb/Creat Ratio: 29 mg/g{creat} (ref 0–29)
Microalbumin, Urine: 23.5 ug/mL

## 2024-04-16 LAB — CBC
Hematocrit: 43.7 % (ref 37.5–51.0)
Hemoglobin: 14.4 g/dL (ref 13.0–17.7)
MCH: 30.1 pg (ref 26.6–33.0)
MCHC: 33 g/dL (ref 31.5–35.7)
MCV: 91 fL (ref 79–97)
Platelets: 241 x10E3/uL (ref 150–450)
RBC: 4.78 x10E6/uL (ref 4.14–5.80)
RDW: 13.3 % (ref 11.6–15.4)
WBC: 6.4 x10E3/uL (ref 3.4–10.8)

## 2024-04-16 LAB — HEMOGLOBIN A1C
Est. average glucose Bld gHb Est-mCnc: 174 mg/dL
Hgb A1c MFr Bld: 7.7 % — ABNORMAL HIGH (ref 4.8–5.6)

## 2024-04-16 LAB — PSA, TOTAL AND FREE
PSA, Free Pct: 30 %
PSA, Free: 0.09 ng/mL
Prostate Specific Ag, Serum: 0.3 ng/mL (ref 0.0–4.0)

## 2024-04-17 ENCOUNTER — Telehealth: Payer: Self-pay

## 2024-04-17 NOTE — Telephone Encounter (Signed)
 pls let him know I will schedule him for further evaluation of circulation in his legs. He had similar test back in 2020.   I called and left pt vm to call the office [facility]

## 2024-04-18 ENCOUNTER — Ambulatory Visit (HOSPITAL_COMMUNITY)
Admission: RE | Admit: 2024-04-18 | Discharge: 2024-04-18 | Disposition: A | Discharge status: Home / Self Care | Source: Ambulatory Visit | Attending: Internal Medicine | Admitting: Internal Medicine

## 2024-04-18 DIAGNOSIS — R0989 Other specified symptoms and signs involving the circulatory and respiratory systems: Secondary | ICD-10-CM | POA: Diagnosis not present

## 2024-04-18 LAB — VAS US ABI WITH/WO TBI
Left ABI: 1.2
Right ABI: 1.22

## 2024-04-23 HISTORY — PX: OTHER SURGICAL HISTORY: SHX169

## 2024-05-24 ENCOUNTER — Ambulatory Visit: Payer: Self-pay | Admitting: Internal Medicine

## 2024-05-30 ENCOUNTER — Encounter: Payer: Self-pay | Admitting: Internal Medicine

## 2024-05-30 ENCOUNTER — Ambulatory Visit (INDEPENDENT_AMBULATORY_CARE_PROVIDER_SITE_OTHER): Payer: Medicare HMO

## 2024-05-30 ENCOUNTER — Ambulatory Visit: Payer: Self-pay | Admitting: Internal Medicine

## 2024-05-30 VITALS — BP 128/60 | HR 80 | Temp 98.5°F | Ht 68.0 in | Wt 213.0 lb

## 2024-05-30 VITALS — BP 128/60 | HR 80 | Temp 98.3°F | Ht 68.0 in | Wt 213.0 lb

## 2024-05-30 DIAGNOSIS — E6609 Other obesity due to excess calories: Secondary | ICD-10-CM | POA: Diagnosis not present

## 2024-05-30 DIAGNOSIS — Z794 Long term (current) use of insulin: Secondary | ICD-10-CM

## 2024-05-30 DIAGNOSIS — N1831 Chronic kidney disease, stage 3a: Secondary | ICD-10-CM | POA: Diagnosis not present

## 2024-05-30 DIAGNOSIS — E1122 Type 2 diabetes mellitus with diabetic chronic kidney disease: Secondary | ICD-10-CM | POA: Diagnosis not present

## 2024-05-30 DIAGNOSIS — Z0000 Encounter for general adult medical examination without abnormal findings: Secondary | ICD-10-CM

## 2024-05-30 DIAGNOSIS — Z6832 Body mass index (BMI) 32.0-32.9, adult: Secondary | ICD-10-CM | POA: Diagnosis not present

## 2024-05-30 DIAGNOSIS — E66811 Obesity, class 1: Secondary | ICD-10-CM

## 2024-05-30 DIAGNOSIS — E7800 Pure hypercholesterolemia, unspecified: Secondary | ICD-10-CM

## 2024-05-30 DIAGNOSIS — I129 Hypertensive chronic kidney disease with stage 1 through stage 4 chronic kidney disease, or unspecified chronic kidney disease: Secondary | ICD-10-CM

## 2024-05-30 NOTE — Progress Notes (Signed)
 Subjective:   Favian Kittleson is a 73 y.o. who presents for a Medicare Wellness preventive visit.  As a reminder, Annual Wellness Visits don't include a physical exam, and some assessments may be limited, especially if this visit is performed virtually. We may recommend an in-person follow-up visit with your provider if needed.  Visit Complete: In person    Persons Participating in Visit: Patient.  AWV Questionnaire: Yes: Patient Medicare AWV questionnaire was completed by the patient on 05/26/2024; I have confirmed that all information answered by patient is correct and no changes since this date.  Cardiac Risk Factors include: advanced age (>62men, >33 women);diabetes mellitus;dyslipidemia;hypertension;male gender;obesity (BMI >30kg/m2)     Objective:    Today's Vitals   05/30/24 1103  BP: 128/60  Pulse: 80  Temp: 98.5 F (36.9 C)  TempSrc: Oral  SpO2: 97%  Weight: 213 lb (96.6 kg)  Height: 5' 8 (1.727 m)   Body mass index is 32.39 kg/m.     05/30/2024   11:09 AM 05/25/2023   11:05 AM 05/13/2022    2:21 PM 04/16/2021    2:15 PM 04/09/2020    2:29 PM 02/07/2020    2:14 PM 07/17/2019    9:38 AM  Advanced Directives  Does Patient Have a Medical Advance Directive? No No No No No No No  Would patient like information on creating a medical advance directive? No - Patient declined No - Patient declined No - Patient declined Yes (MAU/Ambulatory/Procedural Areas - Information given)  Yes (MAU/Ambulatory/Procedural Areas - Information given) No - Patient declined    Current Medications (verified) Outpatient Encounter Medications as of 05/30/2024  Medication Sig   Alcohol Swabs (ALCOHOL WIPES) 70 % PADS USE PAD TO AFFECTED AREA DAILY   amLODipine (NORVASC) 10 MG tablet TAKE ONE TABLET BY MOUTH DAILY FOR HEART   aspirin EC 81 MG tablet Take 81 mg by mouth daily.   atorvastatin (LIPITOR) 40 MG tablet TAKE ONE-HALF TABLET BY MOUTH AT BEDTIME FOR CHOLESTEROL *REPLACES ROSUVASTATIN  (CRESTOR)*   empagliflozin  (JARDIANCE ) 25 MG TABS tablet Take 1 tablet (25 mg total) by mouth daily before breakfast.   hydrochlorothiazide (HYDRODIURIL) 25 MG tablet Take 25 mg by mouth daily.    insulin glargine (LANTUS) 100 UNIT/ML injection Inject 44 Units into the skin at bedtime. 42 units sq nightly   lisinopril (PRINIVIL,ZESTRIL) 40 MG tablet Take 40 mg by mouth daily.    sitaGLIPtin -metformin  (JANUMET ) 50-1000 MG tablet Take 1 tablet by mouth 2 (two) times daily with a meal.   Vitamin D, Cholecalciferol, 25 MCG (1000 UT) CAPS Take by mouth. 1000 units Monday-Friday, 2000 units Saturday and Sunday   No facility-administered encounter medications on file as of 05/30/2024.    Allergies (verified) Pollen extract   History: Past Medical History:  Diagnosis Date   Allergy    DM (diabetes mellitus) (HCC)    HTN (hypertension)    Hyperlipidemia    Past Surgical History:  Procedure Laterality Date   CATARACT EXTRACTION Right 2019   CATARACT EXTRACTION Left 09/21/2021   cyst lesion extraction Right 04/23/2024   TONSILLECTOMY     age 15   Family History  Problem Relation Age of Onset   Diabetes Mother    Hypertension Mother    Stroke Mother    Hypertension Father    Diabetes Father    Stroke Paternal Grandfather    Social History   Socioeconomic History   Marital status: Widowed    Spouse name: Not on file  Number of children: Not on file   Years of education: Not on file   Highest education level: Bachelor's degree (e.g., BA, AB, BS)  Occupational History   Occupation: retired  Tobacco Use   Smoking status: Never   Smokeless tobacco: Never  Vaping Use   Vaping status: Never Used  Substance and Sexual Activity   Alcohol use: Not Currently    Comment: occasionally   Drug use: Never   Sexual activity: Not Currently  Other Topics Concern   Not on file  Social History Narrative   Not on file   Social Drivers of Health   Financial Resource Strain: Low Risk   (05/30/2024)   Overall Financial Resource Strain (CARDIA)    Difficulty of Paying Living Expenses: Not hard at all  Food Insecurity: No Food Insecurity (05/30/2024)   Hunger Vital Sign    Worried About Running Out of Food in the Last Year: Never true    Ran Out of Food in the Last Year: Never true  Transportation Needs: No Transportation Needs (05/30/2024)   PRAPARE - Administrator, Civil Service (Medical): No    Lack of Transportation (Non-Medical): No  Physical Activity: Insufficiently Active (05/30/2024)   Exercise Vital Sign    Days of Exercise per Week: 2 days    Minutes of Exercise per Session: 50 min  Stress: No Stress Concern Present (05/30/2024)   [employer] of Occupational Health - Occupational Stress Questionnaire    Feeling of Stress: Not at all  Social Connections: Socially Isolated (05/30/2024)   Social Connection and Isolation Panel    Frequency of Communication with Friends and Family: Three times a week    Frequency of Social Gatherings with Friends and Family: More than three times a week    Attends Religious Services: Never    Database administrator or Organizations: No    Attends Banker Meetings: Never    Marital Status: Widowed    Tobacco Counseling Counseling given: Not Answered    Clinical Intake:  Pre-visit preparation completed: Yes  Pain : No/denies pain     Nutritional Status: BMI > 30  Obese Nutritional Risks: None Diabetes: Yes CBG done?: No Did pt. bring in CBG monitor from home?: No  Lab Results  Component Value Date   HGBA1C 7.7 (H) 04/12/2024   HGBA1C 8.2 (H) 11/28/2023   HGBA1C 7.4 (H) 08/02/2023     How often do you need to have someone help you when you read instructions, pamphlets, or other written materials from your doctor or pharmacy?: 1 - Never  Interpreter Needed?: No  Information entered by :: NAllen LPN   Activities of Daily Living     05/26/2024    9:08 PM  In your present state of  health, do you have any difficulty performing the following activities:  Hearing? 0  Vision? 0  Difficulty concentrating or making decisions? 0  Walking or climbing stairs? 0  Dressing or bathing? 0  Doing errands, shopping? 0  Preparing Food and eating ? N  Using the Toilet? N  In the past six months, have you accidently leaked urine? N  Do you have problems with loss of bowel control? N  Managing your Medications? N  Managing your Finances? N  Housekeeping or managing your Housekeeping? N    Patient Care Team: Jarold Medici, MD as PCP - General (Internal Medicine) Pearson, Vallie J, [facility] (Inactive) (Pharmacist) Clinic, [facility]  I have updated your Care Teams  any recent Medical Services you may have received from other providers in the past year.     Assessment:   This is a routine wellness examination for Masayuki.  Hearing/Vision screen Hearing Screening - Comments:: Denies hearing issues Vision Screening - Comments:: [facility]   Goals Addressed             This Visit's Progress    Patient Stated       05/30/2024, denies goals       Depression Screen     05/30/2024   11:10 AM 08/02/2023    2:35 PM 05/25/2023   11:08 AM 03/08/2023   11:15 AM 08/10/2022   11:26 AM 05/13/2022    2:22 PM 04/16/2021    2:17 PM  PHQ 2/9 Scores  PHQ - 2 Score 0 0 0 0 0 0 0  PHQ- 9 Score 0 0 0 0       Fall Risk     05/26/2024    9:08 PM 04/12/2024   11:03 AM 11/28/2023    2:16 PM 05/25/2023   11:05 AM 03/08/2023   11:15 AM  Fall Risk   Falls in the past year? 0 0 0 0 0  Number falls in past yr: 0 0 0 0 0  Injury with Fall? 0 0 0 0 0  Risk for fall due to : Medication side effect No Fall Risks No Fall Risks Medication side effect No Fall Risks  Follow up Falls prevention discussed;Falls evaluation completed Falls evaluation completed Falls evaluation completed Falls prevention discussed;Falls evaluation completed Falls evaluation completed    MEDICARE RISK AT  HOME:  Medicare Risk at Home Any stairs in or around the home?: (Patient-Rptd) No If so, are there any without handrails?: (Patient-Rptd) No Home free of loose throw rugs in walkways, pet beds, electrical cords, etc?: (Patient-Rptd) Yes Adequate lighting in your home to reduce risk of falls?: (Patient-Rptd) Yes Life alert?: (Patient-Rptd) No Use of a cane, walker or w/c?: (Patient-Rptd) No Grab bars in the bathroom?: (Patient-Rptd) Yes Shower chair or bench in shower?: (Patient-Rptd) No Elevated toilet seat or a handicapped toilet?: (Patient-Rptd) No  TIMED UP AND GO:  Was the test performed?  Yes  Length of time to ambulate 10 feet: 5 sec Gait steady and fast without use of assistive device  Cognitive Function: 6CIT completed        05/30/2024   11:10 AM 05/25/2023   11:08 AM 05/13/2022    2:22 PM 04/16/2021    2:17 PM 04/09/2020    2:32 PM  6CIT Screen  What Year? 0 points 0 points 0 points 0 points 0 points  What month? 0 points 0 points 0 points 0 points 0 points  What time? 0 points 0 points 0 points 0 points 0 points  Count back from 20 0 points 0 points 0 points 0 points 0 points  Months in reverse 0 points 0 points 0 points 0 points 0 points  Repeat phrase 0 points 0 points 2 points 0 points 2 points  Total Score 0 points 0 points 2 points 0 points 2 points    Immunizations Immunization History  Administered Date(s) Administered   Janssen (J&J) SARS-COV-2 Vaccination 12/17/2019   Moderna Sars-Covid-2 Vaccination 07/28/2020, 01/30/2021   Pfizer Covid-19 Vaccine Bivalent Booster 34yrs & up 07/14/2021   Pneumococcal Conjugate-13 12/12/2015   Pneumococcal Polysaccharide-23 10/11/2014   Tdap 10/11/2014   Unspecified SARS-COV-2 Vaccination 10/25/2023   Zoster Recombinant(Shingrix ) 01/30/2021, 05/01/2021  Screening Tests Health Maintenance  Topic Date Due   OPHTHALMOLOGY EXAM  03/29/2024   COVID-19 Vaccine (6 - 2024-25 season) 05/14/2024   Influenza Vaccine   12/11/2024 (Originally 04/13/2024)   Pneumococcal Vaccine: 50+ Years (3 of 3 - PCV20 or PCV21) 05/30/2025 (Originally 10/12/2019)   DTaP/Tdap/Td (2 - Td or Tdap) 10/11/2024   HEMOGLOBIN A1C  10/13/2024   Diabetic kidney evaluation - eGFR measurement  04/12/2025   Diabetic kidney evaluation - Urine ACR  04/12/2025   FOOT EXAM  04/12/2025   Fecal DNA (Cologuard)  05/25/2025   Medicare Annual Wellness (AWV)  05/30/2025   Hepatitis C Screening  Completed   Zoster Vaccines- Shingrix   Completed   HPV VACCINES  Aged Out   Meningococcal B Vaccine  Aged Out   Colonoscopy  Discontinued    Health Maintenance Items Addressed: States had eye exam [facility] in [facility]  Additional Screening:  Vision Screening: Recommended annual ophthalmology exams for early detection of glaucoma and other disorders of the eye. Is the patient up to date with their annual eye exam?  Yes  Who is the provider or what is the [facility] of the office in which the patient [facility]  Dental Screening: Recommended annual dental exams for proper oral hygiene  Community Resource Referral / Chronic Care Management: CRR required this visit?  No   CCM required this visit?  No   Plan:    I have personally reviewed and noted the following in the patient's chart:   Medical and social history Use of alcohol, tobacco or illicit drugs  Current medications and supplements including opioid prescriptions. Patient is not currently taking opioid prescriptions. Functional ability and status Nutritional status Physical activity Advanced directives List of other physicians Hospitalizations, surgeries, and ER visits in previous 12 months Vitals Screenings to include cognitive, depression, and falls Referrals and appointments  In addition, I have reviewed and discussed with patient certain preventive protocols, quality metrics, and best practice recommendations. A written personalized care plan for preventive services  as well as general preventive health recommendations were provided to patient.   Ardella FORBES Dawn, LPN   [id number]   After Visit Summary: (In Person-Printed) AVS printed and given to the patient  Notes: Nothing significant to report at this time.

## 2024-05-30 NOTE — Patient Instructions (Signed)

## 2024-05-30 NOTE — Patient Instructions (Signed)
 Marcus Davis,  Thank you for taking the time for your Medicare Wellness Visit. I appreciate your continued commitment to your health goals. Please review the care plan we discussed, and feel free to reach out if I can assist you further.  Medicare recommends these wellness visits once per year to help you and your care team stay ahead of potential health issues. These visits are designed to focus on prevention, allowing your provider to concentrate on managing your acute and chronic conditions during your regular appointments.  Please note that Annual Wellness Visits do not include a physical exam. Some assessments may be limited, especially if the visit was conducted virtually. If needed, we may recommend a separate in-person follow-up with your provider.  Ongoing Care Seeing your primary care provider every 3 to 6 months helps us  monitor your health and provide consistent, personalized care.   Referrals If a referral was made during today's visit and you haven't received any updates within two weeks, please contact the referred provider directly to check on the status.  Recommended Screenings:  Health Maintenance  Topic Date Due   Eye exam for diabetics  03/29/2024   COVID-19 Vaccine (6 - 2024-25 season) 05/14/2024   Flu Shot  12/11/2024*   Pneumococcal Vaccine for age over 48 (3 of 3 - PCV20 or PCV21) 05/30/2025*   DTaP/Tdap/Td vaccine (2 - Td or Tdap) 10/11/2024   Hemoglobin A1C  10/13/2024   Yearly kidney function blood test for diabetes  04/12/2025   Yearly kidney health urinalysis for diabetes  04/12/2025   Complete foot exam   04/12/2025   Cologuard (Stool DNA test)  05/25/2025   Medicare Annual Wellness Visit  05/30/2025   Hepatitis C Screening  Completed   Zoster (Shingles) Vaccine  Completed   HPV Vaccine  Aged Out   Meningitis B Vaccine  Aged Out   Colon Cancer Screening  Discontinued  *Topic was postponed. The date shown is not the original due date.       05/30/2024    11:09 AM  Advanced Directives  Does Patient Have a Medical Advance Directive? No  Would patient like information on creating a medical advance directive? No - Patient declined   Advance Care Planning is important because it: Ensures you receive medical care that aligns with your values, goals, and preferences. Provides guidance to your family and loved ones, reducing the emotional burden of decision-making during critical moments.  Vision: Annual vision screenings are recommended for early detection of glaucoma, cataracts, and diabetic retinopathy. These exams can also reveal signs of chronic conditions such as diabetes and high blood pressure.  Dental: Annual dental screenings help detect early signs of oral cancer, gum disease, and other conditions linked to overall health, including heart disease and diabetes.  Please see the attached documents for additional preventive care recommendations.

## 2024-05-30 NOTE — Progress Notes (Signed)
 I,Victoria T Emmitt, CMA,acting as a Neurosurgeon for Catheryn LOISE Slocumb, MD.,have documented all relevant documentation on the behalf of Catheryn LOISE Slocumb, MD,as directed by  Catheryn LOISE Slocumb, MD while in the presence of Catheryn LOISE Slocumb, MD.  Subjective:  Patient ID: Marcus Davis , male    DOB: [birth year] , 73 y.o.   MRN: [id number]  Chief Complaint  Patient presents with   Diabetes    Patient presents today for dm, bp & cholesterol follow up. He reports compliance with medications. Denies headache, chest pain & sob. AWV completed with [facility] Advisor [place].   Hypertension   Hyperlipidemia    HPI Discussed the use of AI scribe software for clinical note transcription with the patient, who gave verbal consent to proceed.  History of Present Illness Albie Arizpe is a 73 year old male with diabetes and hypertension who presents for a Medicare visit.  His blood sugar levels are well-controlled, with recent readings averaging around 106 mg/dL, ranging from a low of 97 mg/dL to a high of 876 mg/dL. He is currently taking Janumet  50/1000 mg twice daily, Jardiance  25 mg daily, and Lantus 42 units at night. He refilled Jardiance  in July for 90 days and Janumet  this month for 90 days. He also takes a baby aspirin and amlodipine 10 mg daily.  He engages in moderate physical activity, primarily walking for about 45 minutes a couple of days a week.  He mentions a recent injury to his leg, which occurred when he walked into a concrete planter at another clinic. The injury became infected but is now much improved. He had a bandage on the leg, but it fell off.   Diabetes He presents for his follow-up diabetic visit. He has type 2 diabetes mellitus. There are no hypoglycemic associated symptoms. There are no diabetic associated symptoms. Pertinent negatives for diabetes include no blurred vision, no polydipsia, no polyphagia and no polyuria. There are no hypoglycemic complications. Risk factors for coronary artery  disease include diabetes mellitus, dyslipidemia, hypertension, male sex and sedentary lifestyle. He is compliant with treatment most of the time. He is following a diabetic diet. He participates in exercise intermittently. Eye exam is current.  Hypertension This is a chronic problem. The current episode started more than 1 year ago. The problem has been gradually improving since onset. The problem is controlled. Pertinent negatives include no blurred vision or palpitations. The current treatment provides moderate improvement.     Past Medical History:  Diagnosis Date   Allergy    DM (diabetes mellitus) (HCC)    HTN (hypertension)    Hyperlipidemia      Family History  Problem Relation Age of Onset   Diabetes Mother    Hypertension Mother    Stroke Mother    Hypertension Father    Diabetes Father    Stroke Paternal Grandfather      Current Outpatient Medications:    Alcohol Swabs (ALCOHOL WIPES) 70 % PADS, USE PAD TO AFFECTED AREA DAILY, Disp: , Rfl:    amLODipine (NORVASC) 10 MG tablet, TAKE ONE TABLET BY MOUTH DAILY FOR HEART, Disp: , Rfl:    aspirin EC 81 MG tablet, Take 81 mg by mouth daily., Disp: , Rfl:    atorvastatin (LIPITOR) 40 MG tablet, TAKE ONE-HALF TABLET BY MOUTH AT BEDTIME FOR CHOLESTEROL *REPLACES ROSUVASTATIN (CRESTOR)*, Disp: , Rfl:    empagliflozin  (JARDIANCE ) 25 MG TABS tablet, Take 1 tablet (25 mg total) by mouth daily before breakfast., Disp: 90 tablet, Rfl: 2  hydrochlorothiazide (HYDRODIURIL) 25 MG tablet, Take 25 mg by mouth daily. , Disp: , Rfl:    insulin glargine (LANTUS) 100 UNIT/ML injection, Inject 44 Units into the skin at bedtime. 42 units sq nightly, Disp: , Rfl:    lisinopril (PRINIVIL,ZESTRIL) 40 MG tablet, Take 40 mg by mouth daily. , Disp: , Rfl:    sitaGLIPtin -metformin  (JANUMET ) 50-1000 MG tablet, Take 1 tablet by mouth 2 (two) times daily with a meal., Disp: 180 tablet, Rfl: 2   Vitamin D, Cholecalciferol, 25 MCG (1000 UT) CAPS, Take by  mouth. 1000 units Monday-Friday, 2000 units Saturday and Sunday, Disp: , Rfl:    Allergies  Allergen Reactions   Pollen Extract Other (See Comments)    Sneezing, runny nose, congestion,      Review of Systems  Constitutional: Negative.   Eyes:  Negative for blurred vision.  Respiratory: Negative.    Cardiovascular: Negative.  Negative for palpitations.  Gastrointestinal: Negative.   Endocrine: Negative for polydipsia, polyphagia and polyuria.  Neurological: Negative.   Psychiatric/Behavioral: Negative.       Today's Vitals   05/30/24 1051  BP: 128/60  Pulse: 80  Temp: 98.3 F (36.8 C)  SpO2: 98%  Weight: 213 lb (96.6 kg)  Height: 5' 8 (1.727 m)   Body mass index is 32.39 kg/m.  Wt Readings from Last 3 Encounters:  05/30/24 213 lb (96.6 kg)  05/30/24 213 lb (96.6 kg)  04/12/24 210 lb 6.4 oz (95.4 kg)     Objective:  Physical Exam Vitals and nursing note reviewed.  Constitutional:      Appearance: Normal appearance.  HENT:     Head: Normocephalic and atraumatic.  Eyes:     Extraocular Movements: Extraocular movements intact.  Cardiovascular:     Rate and Rhythm: Normal rate and regular rhythm.     Heart sounds: Normal heart sounds.  Pulmonary:     Effort: Pulmonary effort is normal.     Breath sounds: Normal breath sounds.  Musculoskeletal:     Cervical back: Normal range of motion.  Skin:    General: Skin is warm.  Neurological:     General: No focal deficit present.     Mental Status: He is alert.  Psychiatric:        Mood and Affect: Mood normal.      Assessment And Plan:  Type 2 diabetes mellitus with stage 3a chronic kidney disease, with long-term current use of insulin (HCC) Assessment & Plan: Chronic, well-controlled blood glucose levels with current medication regimen. - Continue Lantus 42 units at night. - Continue Janumet  50/1000 mg twice daily. - Continue Jardiance  25 mg daily.   Parenchymal renal hypertension, stage 1 through stage 4  or unspecified chronic kidney disease Assessment & Plan: Chronic, hypertension well-managed with amlodipine, lisinopril, and hydrochlorothiazide.  He is encouraged to incorporate more exercise into his daily routine. - Continue amlodipine, lisinopril, and hydrochlorothiazide as prescribed. - Follow low sodium diet.    Pure hypercholesterolemia Assessment & Plan: Chronic, LDL goal is less than 70.  He will continue with atorvastatin 40mg  1/2 tab po qpm. He is encouraged to follow heart healthy lifestyle.  - Order cardiac calcium score test at [facility] location.  Orders: -     CT CARDIAC SCORING (SELF PAY ONLY); Future  Class 1 obesity due to excess calories with serious comorbidity and body mass index (BMI) of 32.0 to 32.9 in adult Assessment & Plan: He is encouraged to initially strive for BMI less than 30 to decrease  cardiac risk. He is advised to exercise no less than 150 minutes per week.      Return for keep dec appointment. one year thn & rs ov apt..  Patient was given opportunity to ask questions. Patient verbalized understanding of the plan and was able to repeat key elements of the plan. All questions were answered to their satisfaction.   I, Catheryn LOISE Slocumb, MD, have reviewed all documentation for this visit. The documentation on 05/30/24 for the exam, diagnosis, procedures, and orders are all accurate and complete.   IF YOU HAVE BEEN REFERRED TO A SPECIALIST, IT MAY TAKE 1-2 WEEKS TO SCHEDULE/PROCESS THE REFERRAL. IF YOU HAVE NOT HEARD FROM US /SPECIALIST IN TWO WEEKS, PLEASE GIVE US  A CALL AT [phone] X [phone].   THE PATIENT IS ENCOURAGED TO PRACTICE SOCIAL DISTANCING DUE TO THE COVID-19 PANDEMIC.

## 2024-06-03 NOTE — Assessment & Plan Note (Signed)
 Chronic, hypertension well-managed with amlodipine, lisinopril, and hydrochlorothiazide.  He is encouraged to incorporate more exercise into his daily routine. - Continue amlodipine, lisinopril, and hydrochlorothiazide as prescribed. - Follow low sodium diet.

## 2024-06-03 NOTE — Assessment & Plan Note (Signed)
 Chronic, well-controlled blood glucose levels with current medication regimen. - Continue Lantus 42 units at night. - Continue Janumet  50/1000 mg twice daily. - Continue Jardiance  25 mg daily.

## 2024-06-03 NOTE — Assessment & Plan Note (Signed)
 He is encouraged to initially strive for BMI less than 30 to decrease cardiac risk. He is advised to exercise no less than 150 minutes per week.

## 2024-06-03 NOTE — Assessment & Plan Note (Addendum)
 Chronic, LDL goal is less than 70.  He will continue with atorvastatin 40mg  1/2 tab po qpm. He is encouraged to follow heart healthy lifestyle.  - Order cardiac calcium score test at [facility] location.

## 2024-06-20 ENCOUNTER — Ambulatory Visit: Payer: Self-pay | Admitting: Internal Medicine

## 2024-06-20 ENCOUNTER — Ambulatory Visit (HOSPITAL_BASED_OUTPATIENT_CLINIC_OR_DEPARTMENT_OTHER)
Admission: RE | Admit: 2024-06-20 | Discharge: 2024-06-20 | Disposition: A | Discharge status: Home / Self Care | Payer: Self-pay | Source: Ambulatory Visit | Attending: Internal Medicine | Admitting: Internal Medicine

## 2024-06-20 DIAGNOSIS — E7800 Pure hypercholesterolemia, unspecified: Secondary | ICD-10-CM | POA: Insufficient documentation

## 2024-06-20 DIAGNOSIS — I2510 Atherosclerotic heart disease of native coronary artery without angina pectoris: Secondary | ICD-10-CM

## 2024-07-18 ENCOUNTER — Ambulatory Visit (HOSPITAL_COMMUNITY)
Admission: RE | Admit: 2024-07-18 | Discharge: 2024-07-18 | Disposition: A | Discharge status: Home / Self Care | Source: Ambulatory Visit | Attending: Internal Medicine | Admitting: Internal Medicine

## 2024-07-18 DIAGNOSIS — R600 Localized edema: Secondary | ICD-10-CM | POA: Diagnosis not present

## 2024-07-18 DIAGNOSIS — I2510 Atherosclerotic heart disease of native coronary artery without angina pectoris: Secondary | ICD-10-CM | POA: Insufficient documentation

## 2024-07-18 LAB — ECHOCARDIOGRAM COMPLETE
AR max vel: 2.77 cm2
AV Area VTI: 2.32 cm2
AV Area mean vel: 2.52 cm2
AV Mean grad: 3 mmHg
AV Peak grad: 5.6 mmHg
Ao pk vel: 1.18 m/s
Area-P 1/2: 3.97 cm2
S' Lateral: 2.6 cm

## 2024-07-22 ENCOUNTER — Ambulatory Visit: Payer: Self-pay | Admitting: Internal Medicine

## 2024-08-06 ENCOUNTER — Telehealth: Payer: Self-pay | Admitting: Pharmacist

## 2024-08-06 NOTE — Progress Notes (Signed)
 Pharmacy Quality Measure Review  This patient is appearing on the insurance-providing list for being at risk of failing the adherence measure for Statin Use in Persons with Diabetes (SUPD) medications this calendar year.   Appears atorvastatin is ordered through the [place]. No action needed.   Catie IVAR Centers, PharmD, [facility] Clinical Pharmacist [phone]

## 2024-08-14 ENCOUNTER — Encounter: Payer: Self-pay | Admitting: Internal Medicine

## 2024-08-14 ENCOUNTER — Ambulatory Visit: Payer: Self-pay | Admitting: Internal Medicine

## 2024-08-14 VITALS — BP 118/70 | HR 85 | Temp 98.6°F | Ht 68.0 in | Wt 211.2 lb

## 2024-08-14 DIAGNOSIS — I77810 Thoracic aortic ectasia: Secondary | ICD-10-CM | POA: Insufficient documentation

## 2024-08-14 DIAGNOSIS — E1122 Type 2 diabetes mellitus with diabetic chronic kidney disease: Secondary | ICD-10-CM

## 2024-08-14 DIAGNOSIS — I119 Hypertensive heart disease without heart failure: Secondary | ICD-10-CM

## 2024-08-14 DIAGNOSIS — E66811 Obesity, class 1: Secondary | ICD-10-CM

## 2024-08-14 DIAGNOSIS — Z6832 Body mass index (BMI) 32.0-32.9, adult: Secondary | ICD-10-CM

## 2024-08-14 DIAGNOSIS — E113553 Type 2 diabetes mellitus with stable proliferative diabetic retinopathy, bilateral: Secondary | ICD-10-CM | POA: Insufficient documentation

## 2024-08-14 DIAGNOSIS — I1310 Hypertensive heart and chronic kidney disease without heart failure, with stage 1 through stage 4 chronic kidney disease, or unspecified chronic kidney disease: Secondary | ICD-10-CM | POA: Diagnosis not present

## 2024-08-14 DIAGNOSIS — E6609 Other obesity due to excess calories: Secondary | ICD-10-CM | POA: Diagnosis not present

## 2024-08-14 DIAGNOSIS — E7800 Pure hypercholesterolemia, unspecified: Secondary | ICD-10-CM

## 2024-08-14 DIAGNOSIS — Z794 Long term (current) use of insulin: Secondary | ICD-10-CM | POA: Diagnosis not present

## 2024-08-14 DIAGNOSIS — I2510 Atherosclerotic heart disease of native coronary artery without angina pectoris: Secondary | ICD-10-CM | POA: Insufficient documentation

## 2024-08-14 DIAGNOSIS — N1831 Chronic kidney disease, stage 3a: Secondary | ICD-10-CM | POA: Diagnosis not present

## 2024-08-14 NOTE — Progress Notes (Signed)
 I,Victoria T Emmitt, CMA,acting as a neurosurgeon for Catheryn LOISE Slocumb, MD.,have documented all relevant documentation on the behalf of Catheryn LOISE Slocumb, MD,as directed by  Catheryn LOISE Slocumb, MD while in the presence of Catheryn LOISE Slocumb, MD.  Subjective:  Patient ID: Marcus Davis , male    DOB: 25-Oct-1950 , 73 y.o.   MRN: [id number]  Chief Complaint  Patient presents with   Diabetes    Patient presents today for dm, bp & cholesterol. He reports compliance with medications. Denies headache, chest pain & sob. He would like to review echocardiogram results. He also reports receiving a bill for echocardiogram which he did not expect.    Hypertension   Hyperlipidemia    HPI Discussed the use of AI scribe software for clinical note transcription with the patient, who gave verbal consent to proceed.  History of Present Illness Marcus Davis is a 73 year old male with diabetes and coronary artery disease who presents for diabetes management and follow-up on cardiac testing.  His blood sugar levels have been fluctuating, with recent increases noted from 116 to 126 mg/dL. Previously, his blood sugars were averaging between 80 and 106 mg/dL until about a week before Thanksgiving. He denies any increase in food intake during this period and is unsure of the cause of this change. His last A1c was 7.7%, improved from 8.2%, and he aims to keep his A1c as close to 7% as possible. He confirms that he had a diabetic eye exam in February and received new glasses in March.  He has a cardiac calcium score of 791. An echocardiogram was performed, and the aorta measured 38 mm. He has not had any testing at the [facility] to look at his arteries. He is currently taking aspirin daily and his cholesterol levels are well controlled, with his last LDL measurement at 55 mg/dL.  He had a ganglion cyst removed from his right wrist in August. He reports that it was a bundle of blood vessels inside the cyst, but he has not received an  explanation for its cause.  He tries to walk when the weather is good but is unsure about his exercise routine during the winter months. He drinks water regularly and does not take ibuprofen, opting for Tylenol or aspirin instead.   Diabetes He presents for his follow-up diabetic visit. He has type 2 diabetes mellitus. There are no hypoglycemic associated symptoms. There are no diabetic associated symptoms. Pertinent negatives for diabetes include no blurred vision, no polydipsia, no polyphagia and no polyuria. There are no hypoglycemic complications. Risk factors for coronary artery disease include diabetes mellitus, dyslipidemia, hypertension, male sex and sedentary lifestyle. He is compliant with treatment most of the time. He is following a diabetic diet. He participates in exercise intermittently. Eye exam is current.  Hypertension This is a chronic problem. The current episode started more than 1 year ago. The problem has been gradually improving since onset. The problem is controlled. Pertinent negatives include no blurred vision or palpitations. The current treatment provides moderate improvement.     Past Medical History:  Diagnosis Date   Allergy    DM (diabetes mellitus) (HCC)    HTN (hypertension)    Hyperlipidemia      Family History  Problem Relation Age of Onset   Diabetes Mother    Hypertension Mother    Stroke Mother    Hypertension Father    Diabetes Father    Stroke Paternal Grandfather      Current Outpatient  Medications:    Alcohol Swabs (ALCOHOL WIPES) 70 % PADS, USE PAD TO AFFECTED AREA DAILY, Disp: , Rfl:    amLODipine (NORVASC) 10 MG tablet, TAKE ONE TABLET BY MOUTH DAILY FOR HEART, Disp: , Rfl:    aspirin EC 81 MG tablet, Take 81 mg by mouth daily., Disp: , Rfl:    atorvastatin (LIPITOR) 40 MG tablet, TAKE ONE-HALF TABLET BY MOUTH AT BEDTIME FOR CHOLESTEROL *REPLACES ROSUVASTATIN (CRESTOR)*, Disp: , Rfl:    empagliflozin  (JARDIANCE ) 25 MG TABS tablet, Take  1 tablet (25 mg total) by mouth daily before breakfast., Disp: 90 tablet, Rfl: 2   hydrochlorothiazide (HYDRODIURIL) 25 MG tablet, Take 25 mg by mouth daily. , Disp: , Rfl:    insulin glargine (LANTUS) 100 UNIT/ML injection, Inject 44 Units into the skin at bedtime. 42 units sq nightly, Disp: , Rfl:    lisinopril (PRINIVIL,ZESTRIL) 40 MG tablet, Take 40 mg by mouth daily. , Disp: , Rfl:    sitaGLIPtin -metformin  (JANUMET ) 50-1000 MG tablet, Take 1 tablet by mouth 2 (two) times daily with a meal., Disp: 180 tablet, Rfl: 2   Vitamin D, Cholecalciferol, 25 MCG (1000 UT) CAPS, Take by mouth. 1000 units Monday-Friday, 2000 units Saturday and Sunday, Disp: , Rfl:    Allergies  Allergen Reactions   Pollen Extract Other (See Comments)    Sneezing, runny nose, congestion,      Review of Systems  Constitutional: Negative.   Eyes:  Negative for blurred vision.  Respiratory: Negative.    Cardiovascular: Negative.  Negative for palpitations.  Gastrointestinal: Negative.   Endocrine: Negative for polydipsia, polyphagia and polyuria.  Skin: Negative.   Allergic/Immunologic: Negative.   Hematological: Negative.      Today's Vitals   08/14/24 1043  BP: 118/70  Pulse: 85  Temp: 98.6 F (37 C)  SpO2: 98%  Weight: 211 lb 3.2 oz (95.8 kg)  Height: 5' 8 (1.727 m)   Body mass index is 32.11 kg/m.  Wt Readings from Last 3 Encounters:  08/14/24 211 lb 3.2 oz (95.8 kg)  05/30/24 213 lb (96.6 kg)  05/30/24 213 lb (96.6 kg)     Objective:  Physical Exam Vitals and nursing note reviewed.  Constitutional:      Appearance: Normal appearance.  HENT:     Head: Normocephalic and atraumatic.  Eyes:     Extraocular Movements: Extraocular movements intact.  Cardiovascular:     Rate and Rhythm: Normal rate and regular rhythm.     Heart sounds: Normal heart sounds.  Pulmonary:     Effort: Pulmonary effort is normal.     Breath sounds: Normal breath sounds.  Musculoskeletal:     Cervical back:  Normal range of motion.  Skin:    General: Skin is warm.  Neurological:     General: No focal deficit present.     Mental Status: He is alert.  Psychiatric:        Mood and Affect: Mood normal.      Assessment And Plan:  Type 2 diabetes mellitus with stage 3a chronic kidney disease, with long-term current use of insulin (HCC) Assessment & Plan: Blood glucose levels elevated, A1c improved to 7.7%. Dental issues may contribute to glucose elevation. Emphasized maintaining A1c near 7% to reduce cardiac risk. - Schedule dental appointment for cleaning and evaluation. - Ordered A1c test. - Encouraged regular physical activity. - Advised against NSAIDs due to kidney function. - Continue with Jardiance  25mg  daily, Lantus 44 units nightly and Janumet  50/1000mg  po twice daily.  -  Need to consider GLP-1 therapy in the future.  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 He is s/p laser surgery b/l.    Hypertensive heart disease without heart failure Assessment & Plan: Chronic, hypertension well-managed with amlodipine, lisinopril, and hydrochlorothiazide.  He is encouraged to incorporate more exercise into his daily routine. - Continue amlodipine, lisinopril, and hydrochlorothiazide as prescribed. - Follow low sodium diet.  - Echo results reviewed, nl heart function, grade 1 diastolic dysfunction.   Orders: -     CMP14+EGFR  Coronary artery calcification Assessment & Plan: Cardiac calcium score of 791 indicates extensive coronary calcification. Echocardiogram shows good function but impaired relaxation due to hypertension. - Referred to cardiology for evaluation and potential stress test. - Continue aspirin therapy. - Monitor cholesterol levels, maintain LDL below  70 mg/dL.   Pure hypercholesterolemia Assessment & Plan: Chronic, LDL goal is less than 55 due to underlying CAD.  He will continue with atorvastatin 40mg  1/2 tab po qpm. He is encouraged to follow heart healthy lifestyle.    Orders: -     CMP14+EGFR  Aortic root dilation Assessment & Plan: Aortic root dilation at 38 mm, below concern threshold. Regular monitoring necessary to prevent progression. - Schedule follow-up echocardiogram or CT scan in 1-2 years. - Continue blood pressure management.   Class 1 obesity due to excess calories with serious comorbidity and body mass index (BMI) of 32.0 to 32.9 in adult Assessment & Plan: He is encouraged to initially strive for BMI less than 30 to decrease cardiac risk. He is advised to exercise no less than 150 minutes per week.       Return for 4 month dm f/u.SABRA  Patient was given opportunity to ask questions. Patient verbalized understanding of the plan and was able to repeat key elements of the plan. All questions were answered to their satisfaction.   I, Catheryn LOISE Slocumb, MD, have reviewed all documentation for this visit. The documentation on 08/14/24 for the exam, diagnosis, procedures, and orders are all accurate and complete.   IF YOU HAVE BEEN REFERRED TO A SPECIALIST, IT MAY TAKE 1-2 WEEKS TO SCHEDULE/PROCESS THE REFERRAL. IF YOU HAVE NOT HEARD FROM US /SPECIALIST IN TWO WEEKS, PLEASE GIVE US  A CALL AT [phone].   THE PATIENT IS ENCOURAGED TO PRACTICE SOCIAL DISTANCING DUE TO THE COVID-19 PANDEMIC.

## 2024-08-14 NOTE — Patient Instructions (Signed)

## 2024-08-14 NOTE — Assessment & Plan Note (Signed)
 Blood glucose levels elevated, A1c improved to 7.7%. Dental issues may contribute to glucose elevation. Emphasized maintaining A1c near 7% to reduce cardiac risk. - Schedule dental appointment for cleaning and evaluation. - Ordered A1c test. - Encouraged regular physical activity. - Advised against NSAIDs due to kidney function. - Continue with Jardiance  25mg  daily, Lantus 44 units nightly and Janumet  50/1000mg  po twice daily.  - Need to consider GLP-1 therapy in the future.

## 2024-08-14 NOTE — Assessment & Plan Note (Signed)
 Jan 2025 optometry notes reviewed in Care Everywhere, he is followed at the [facility] - sx are stable. He is s/p laser surgery b/l.

## 2024-08-14 NOTE — Assessment & Plan Note (Signed)
 He is encouraged to initially strive for BMI less than 30 to decrease cardiac risk. He is advised to exercise no less than 150 minutes per week.

## 2024-08-14 NOTE — Assessment & Plan Note (Signed)
 Chronic, LDL goal is less than 55 due to underlying CAD.  He will continue with atorvastatin 40mg  1/2 tab po qpm. He is encouraged to follow heart healthy lifestyle.

## 2024-08-14 NOTE — Assessment & Plan Note (Signed)
 Cardiac calcium score of 791 indicates extensive coronary calcification. Echocardiogram shows good function but impaired relaxation due to hypertension. - Referred to cardiology for evaluation and potential stress test. - Continue aspirin therapy. - Monitor cholesterol levels, maintain LDL below 70 mg/dL.

## 2024-08-14 NOTE — Assessment & Plan Note (Addendum)
 Chronic, hypertension well-managed with amlodipine, lisinopril, and hydrochlorothiazide.  He is encouraged to incorporate more exercise into his daily routine. - Continue amlodipine, lisinopril, and hydrochlorothiazide as prescribed. - Follow low sodium diet.  - Echo results reviewed, nl heart function, grade 1 diastolic dysfunction.

## 2024-08-14 NOTE — Assessment & Plan Note (Signed)
 Aortic root dilation at 38 mm, below concern threshold. Regular monitoring necessary to prevent progression. - Schedule follow-up echocardiogram or CT scan in 1-2 years. - Continue blood pressure management.

## 2024-08-15 ENCOUNTER — Ambulatory Visit: Payer: Self-pay | Admitting: Internal Medicine

## 2024-08-15 LAB — CMP14+EGFR
ALT: 28 IU/L (ref 0–44)
AST: 23 IU/L (ref 0–40)
Albumin: 4.5 g/dL (ref 3.8–4.8)
Alkaline Phosphatase: 97 IU/L (ref 47–123)
BUN/Creatinine Ratio: 12 (ref 10–24)
BUN: 15 mg/dL (ref 8–27)
Bilirubin Total: 0.3 mg/dL (ref 0.0–1.2)
CO2: 24 mmol/L (ref 20–29)
Calcium: 9.8 mg/dL (ref 8.6–10.2)
Chloride: 98 mmol/L (ref 96–106)
Creatinine, Ser: 1.3 mg/dL — ABNORMAL HIGH (ref 0.76–1.27)
Globulin, Total: 2.5 g/dL (ref 1.5–4.5)
Glucose: 110 mg/dL — ABNORMAL HIGH (ref 70–99)
Potassium: 4.2 mmol/L (ref 3.5–5.2)
Sodium: 140 mmol/L (ref 134–144)
Total Protein: 7 g/dL (ref 6.0–8.5)
eGFR: 59 mL/min/1.73 — ABNORMAL LOW (ref >59)

## 2024-08-15 LAB — HEMOGLOBIN A1C
Est. average glucose Bld gHb Est-mCnc: 189 mg/dL
Hgb A1c MFr Bld: 8.2 % — ABNORMAL HIGH (ref 4.8–5.6)

## 2024-09-23 ENCOUNTER — Other Ambulatory Visit: Payer: Self-pay | Admitting: Internal Medicine

## 2024-09-26 ENCOUNTER — Other Ambulatory Visit: Payer: Self-pay | Admitting: Internal Medicine

## 2024-12-17 ENCOUNTER — Ambulatory Visit: Admitting: Internal Medicine

## 2025-04-15 ENCOUNTER — Encounter: Payer: Self-pay | Admitting: Internal Medicine

## 2025-07-17 ENCOUNTER — Ambulatory Visit: Payer: Self-pay | Admitting: Internal Medicine

## 2025-07-17 ENCOUNTER — Ambulatory Visit: Payer: Self-pay
